# Patient Record
Sex: Female | Born: 1935 | Race: White | Hispanic: No | Smoking: Former smoker
Health system: Southern US, Community
[De-identification: ages and names within clinical notes are randomized; demographics above are authoritative.]

## PROBLEM LIST (undated history)

## (undated) DIAGNOSIS — I6529 Occlusion and stenosis of unspecified carotid artery: Secondary | ICD-10-CM

## (undated) DIAGNOSIS — G4700 Insomnia, unspecified: Secondary | ICD-10-CM

## (undated) DIAGNOSIS — M1990 Unspecified osteoarthritis, unspecified site: Secondary | ICD-10-CM

## (undated) DIAGNOSIS — E785 Hyperlipidemia, unspecified: Secondary | ICD-10-CM

## (undated) DIAGNOSIS — F32A Depression, unspecified: Secondary | ICD-10-CM

## (undated) DIAGNOSIS — F329 Major depressive disorder, single episode, unspecified: Secondary | ICD-10-CM

## (undated) DIAGNOSIS — E039 Hypothyroidism, unspecified: Secondary | ICD-10-CM

## (undated) DIAGNOSIS — I10 Essential (primary) hypertension: Secondary | ICD-10-CM

## (undated) DIAGNOSIS — G459 Transient cerebral ischemic attack, unspecified: Secondary | ICD-10-CM

## (undated) DIAGNOSIS — I2510 Atherosclerotic heart disease of native coronary artery without angina pectoris: Secondary | ICD-10-CM

## (undated) DIAGNOSIS — T4145XA Adverse effect of unspecified anesthetic, initial encounter: Secondary | ICD-10-CM

## (undated) DIAGNOSIS — H409 Unspecified glaucoma: Secondary | ICD-10-CM

## (undated) DIAGNOSIS — K279 Peptic ulcer, site unspecified, unspecified as acute or chronic, without hemorrhage or perforation: Secondary | ICD-10-CM

## (undated) DIAGNOSIS — T8859XA Other complications of anesthesia, initial encounter: Secondary | ICD-10-CM

## (undated) HISTORY — PX: CARDIAC CATHETERIZATION: SHX172

## (undated) HISTORY — PX: OTHER SURGICAL HISTORY: SHX169

## (undated) HISTORY — DX: Unspecified glaucoma: H40.9

## (undated) HISTORY — DX: Depression, unspecified: F32.A

## (undated) HISTORY — PX: KNEE ARTHROSCOPY: SHX127

## (undated) HISTORY — DX: Major depressive disorder, single episode, unspecified: F32.9

## (undated) HISTORY — DX: Transient cerebral ischemic attack, unspecified: G45.9

## (undated) HISTORY — PX: TONSILLECTOMY: SUR1361

---

## 2000-05-01 ENCOUNTER — Encounter: Payer: Self-pay | Admitting: Gastroenterology

## 2000-05-01 ENCOUNTER — Encounter: Admission: RE | Admit: 2000-05-01 | Discharge: 2000-05-01 | Payer: Self-pay | Admitting: Gastroenterology

## 2000-06-12 ENCOUNTER — Ambulatory Visit (HOSPITAL_COMMUNITY): Admission: RE | Admit: 2000-06-12 | Discharge: 2000-06-12 | Payer: Self-pay | Admitting: Gastroenterology

## 2004-06-07 ENCOUNTER — Inpatient Hospital Stay: Payer: Self-pay | Admitting: Internal Medicine

## 2004-06-07 ENCOUNTER — Other Ambulatory Visit: Payer: Self-pay

## 2004-08-24 ENCOUNTER — Emergency Department: Payer: Self-pay | Admitting: Emergency Medicine

## 2004-08-25 ENCOUNTER — Other Ambulatory Visit: Payer: Self-pay

## 2004-08-29 ENCOUNTER — Emergency Department: Payer: Self-pay | Admitting: Emergency Medicine

## 2004-09-27 ENCOUNTER — Ambulatory Visit: Payer: Self-pay | Admitting: Internal Medicine

## 2004-10-11 ENCOUNTER — Ambulatory Visit: Payer: Self-pay | Admitting: Ophthalmology

## 2004-10-16 ENCOUNTER — Ambulatory Visit: Payer: Self-pay | Admitting: Ophthalmology

## 2005-04-03 ENCOUNTER — Ambulatory Visit: Payer: Self-pay

## 2005-05-23 ENCOUNTER — Ambulatory Visit: Payer: Self-pay | Admitting: Rheumatology

## 2005-06-08 ENCOUNTER — Other Ambulatory Visit: Payer: Self-pay

## 2005-06-08 ENCOUNTER — Ambulatory Visit: Payer: Self-pay | Admitting: Orthopedic Surgery

## 2005-06-14 ENCOUNTER — Ambulatory Visit: Payer: Self-pay | Admitting: Orthopedic Surgery

## 2005-08-28 ENCOUNTER — Ambulatory Visit: Payer: Self-pay | Admitting: Gastroenterology

## 2005-12-18 ENCOUNTER — Ambulatory Visit: Payer: Self-pay | Admitting: Internal Medicine

## 2007-01-23 ENCOUNTER — Ambulatory Visit: Payer: Self-pay | Admitting: Internal Medicine

## 2008-01-29 ENCOUNTER — Ambulatory Visit: Payer: Self-pay | Admitting: Internal Medicine

## 2009-03-30 ENCOUNTER — Ambulatory Visit: Payer: Self-pay | Admitting: Internal Medicine

## 2009-04-05 ENCOUNTER — Ambulatory Visit: Payer: Self-pay | Admitting: Gastroenterology

## 2009-11-01 ENCOUNTER — Encounter: Payer: Self-pay | Admitting: Unknown Physician Specialty

## 2009-11-07 ENCOUNTER — Encounter: Payer: Self-pay | Admitting: Unknown Physician Specialty

## 2009-12-08 ENCOUNTER — Encounter: Payer: Self-pay | Admitting: Unknown Physician Specialty

## 2010-01-07 ENCOUNTER — Encounter: Payer: Self-pay | Admitting: Unknown Physician Specialty

## 2010-04-05 ENCOUNTER — Ambulatory Visit: Payer: Self-pay | Admitting: Internal Medicine

## 2010-10-27 ENCOUNTER — Emergency Department: Payer: Self-pay | Admitting: Emergency Medicine

## 2011-07-19 ENCOUNTER — Ambulatory Visit: Payer: Self-pay | Admitting: Internal Medicine

## 2011-08-28 ENCOUNTER — Ambulatory Visit: Payer: Self-pay | Admitting: Unknown Physician Specialty

## 2012-05-02 ENCOUNTER — Ambulatory Visit: Payer: Self-pay | Admitting: Orthopedic Surgery

## 2012-05-09 ENCOUNTER — Ambulatory Visit: Payer: Self-pay | Admitting: Physical Medicine and Rehabilitation

## 2012-09-11 ENCOUNTER — Ambulatory Visit: Payer: Self-pay | Admitting: Internal Medicine

## 2013-07-09 DIAGNOSIS — E782 Mixed hyperlipidemia: Secondary | ICD-10-CM | POA: Insufficient documentation

## 2013-07-18 ENCOUNTER — Emergency Department: Payer: Self-pay | Admitting: Emergency Medicine

## 2013-07-18 LAB — URINALYSIS, COMPLETE
Bilirubin,UR: NEGATIVE
Blood: NEGATIVE
Glucose,UR: NEGATIVE mg/dL (ref 0–75)
Hyaline Cast: 2
Leukocyte Esterase: NEGATIVE
Nitrite: NEGATIVE
Ph: 5 (ref 4.5–8.0)
Protein: 30
RBC,UR: 1 /HPF (ref 0–5)
Specific Gravity: 1.018 (ref 1.003–1.030)
Squamous Epithelial: <1
WBC UR: 2 /HPF (ref 0–5)

## 2013-07-18 LAB — CBC WITH DIFFERENTIAL/PLATELET
Basophil #: 0.1 10*3/uL (ref 0.0–0.1)
Basophil %: 1 %
Eosinophil #: 0.1 10*3/uL (ref 0.0–0.7)
Eosinophil %: 0.6 %
HCT: 42.1 % (ref 35.0–47.0)
HGB: 13.8 g/dL (ref 12.0–16.0)
Lymphocyte #: 1.7 10*3/uL (ref 1.0–3.6)
Lymphocyte %: 21 %
MCH: 29.9 pg (ref 26.0–34.0)
MCHC: 32.8 g/dL (ref 32.0–36.0)
MCV: 91 fL (ref 80–100)
Monocyte #: 0.6 x10 3/mm (ref 0.2–0.9)
Monocyte %: 8.1 %
Neutrophil #: 5.5 10*3/uL (ref 1.4–6.5)
Neutrophil %: 69.3 %
Platelet: 271 10*3/uL (ref 150–440)
RBC: 4.6 10*6/uL (ref 3.80–5.20)
RDW: 14.3 % (ref 11.5–14.5)
WBC: 8 10*3/uL (ref 3.6–11.0)

## 2013-07-18 LAB — D-DIMER(ARMC): D-Dimer: 449 ng/ml

## 2013-07-18 LAB — BASIC METABOLIC PANEL
Anion Gap: 8 (ref 7–16)
BUN: 20 mg/dL — ABNORMAL HIGH (ref 7–18)
Calcium, Total: 8.1 mg/dL — ABNORMAL LOW (ref 8.5–10.1)
Chloride: 104 mmol/L (ref 98–107)
Co2: 21 mmol/L (ref 21–32)
Creatinine: 0.72 mg/dL (ref 0.60–1.30)
EGFR (African American): >60
EGFR (Non-African Amer.): >60
Glucose: 109 mg/dL — ABNORMAL HIGH (ref 65–99)
Osmolality: 270 (ref 275–301)
Potassium: 4.7 mmol/L (ref 3.5–5.1)
Sodium: 133 mmol/L — ABNORMAL LOW (ref 136–145)

## 2013-07-18 LAB — TROPONIN I
Troponin-I: <0.02 ng/mL
Troponin-I: <0.02 ng/mL

## 2013-08-19 ENCOUNTER — Inpatient Hospital Stay: Payer: Self-pay | Admitting: Internal Medicine

## 2013-08-19 LAB — URINALYSIS, COMPLETE
BLOOD: NEGATIVE
Bacteria: NONE SEEN
Bilirubin,UR: NEGATIVE
Glucose,UR: NEGATIVE mg/dL (ref 0–75)
Ketone: NEGATIVE
LEUKOCYTE ESTERASE: NEGATIVE
Nitrite: NEGATIVE
PH: 6 (ref 4.5–8.0)
PROTEIN: NEGATIVE
RBC, UR: NONE SEEN /HPF (ref 0–5)
SPECIFIC GRAVITY: 1.017 (ref 1.003–1.030)

## 2013-08-19 LAB — CBC
HCT: 37.8 % (ref 35.0–47.0)
HGB: 12.5 g/dL (ref 12.0–16.0)
MCH: 30.3 pg (ref 26.0–34.0)
MCHC: 32.9 g/dL (ref 32.0–36.0)
MCV: 92 fL (ref 80–100)
Platelet: 181 10*3/uL (ref 150–440)
RBC: 4.1 10*6/uL (ref 3.80–5.20)
RDW: 13.5 % (ref 11.5–14.5)
WBC: 9.1 10*3/uL (ref 3.6–11.0)

## 2013-08-19 LAB — COMPREHENSIVE METABOLIC PANEL
Albumin: 2.8 g/dL — ABNORMAL LOW (ref 3.4–5.0)
Alkaline Phosphatase: 68 U/L
Anion Gap: 9 (ref 7–16)
BUN: 22 mg/dL — ABNORMAL HIGH (ref 7–18)
Bilirubin,Total: 0.6 mg/dL (ref 0.2–1.0)
Calcium, Total: 8.2 mg/dL — ABNORMAL LOW (ref 8.5–10.1)
Chloride: 106 mmol/L (ref 98–107)
Co2: 22 mmol/L (ref 21–32)
Creatinine: 1.01 mg/dL (ref 0.60–1.30)
EGFR (African American): >60
EGFR (Non-African Amer.): 52 — ABNORMAL LOW
Glucose: 117 mg/dL — ABNORMAL HIGH (ref 65–99)
Osmolality: 278 (ref 275–301)
Potassium: 4 mmol/L (ref 3.5–5.1)
SGOT(AST): 21 U/L (ref 15–37)
SGPT (ALT): 16 U/L (ref 12–78)
Sodium: 137 mmol/L (ref 136–145)
Total Protein: 6.5 g/dL (ref 6.4–8.2)

## 2013-08-19 LAB — CK TOTAL AND CKMB (NOT AT ARMC)
CK, Total: 90 U/L
CK-MB: 1 ng/mL (ref 0.5–3.6)

## 2013-08-19 LAB — TROPONIN I: Troponin-I: <0.02 ng/mL

## 2013-08-20 LAB — BASIC METABOLIC PANEL
ANION GAP: 4 — AB (ref 7–16)
BUN: 20 mg/dL — ABNORMAL HIGH (ref 7–18)
CALCIUM: 8.3 mg/dL — AB (ref 8.5–10.1)
CO2: 28 mmol/L (ref 21–32)
CREATININE: 0.74 mg/dL (ref 0.60–1.30)
Chloride: 105 mmol/L (ref 98–107)
EGFR (African American): >60
GLUCOSE: 100 mg/dL — AB (ref 65–99)
OSMOLALITY: 277 (ref 275–301)
Potassium: 3.9 mmol/L (ref 3.5–5.1)
Sodium: 137 mmol/L (ref 136–145)

## 2013-08-20 LAB — PROTIME-INR
INR: 1.1
Prothrombin Time: 13.6 secs (ref 11.5–14.7)

## 2013-08-20 LAB — CBC WITH DIFFERENTIAL/PLATELET
BASOS ABS: 0.1 10*3/uL (ref 0.0–0.1)
Basophil %: 1.2 %
EOS PCT: 3.3 %
Eosinophil #: 0.2 10*3/uL (ref 0.0–0.7)
HCT: 35.2 % (ref 35.0–47.0)
HGB: 11.6 g/dL — AB (ref 12.0–16.0)
Lymphocyte #: 1.1 10*3/uL (ref 1.0–3.6)
Lymphocyte %: 21.8 %
MCH: 30.4 pg (ref 26.0–34.0)
MCHC: 33.1 g/dL (ref 32.0–36.0)
MCV: 92 fL (ref 80–100)
MONO ABS: 0.5 x10 3/mm (ref 0.2–0.9)
MONOS PCT: 10.6 %
NEUTROS ABS: 3.2 10*3/uL (ref 1.4–6.5)
NEUTROS PCT: 63.1 %
PLATELETS: 162 10*3/uL (ref 150–440)
RBC: 3.83 10*6/uL (ref 3.80–5.20)
RDW: 13.5 % (ref 11.5–14.5)
WBC: 5 10*3/uL (ref 3.6–11.0)

## 2013-08-20 LAB — LIPID PANEL
Cholesterol: 141 mg/dL (ref 0–200)
HDL: 42 mg/dL (ref 40–60)
Ldl Cholesterol, Calc: 73 mg/dL (ref 0–100)
TRIGLYCERIDES: 129 mg/dL (ref 0–200)
VLDL CHOLESTEROL, CALC: 26 mg/dL (ref 5–40)

## 2013-08-20 LAB — TSH: Thyroid Stimulating Horm: 2.64 u[IU]/mL

## 2013-08-21 LAB — BASIC METABOLIC PANEL
ANION GAP: 6 — AB (ref 7–16)
BUN: 13 mg/dL (ref 7–18)
CALCIUM: 8.9 mg/dL (ref 8.5–10.1)
CO2: 29 mmol/L (ref 21–32)
CREATININE: 0.7 mg/dL (ref 0.60–1.30)
Chloride: 102 mmol/L (ref 98–107)
EGFR (Non-African Amer.): >60
Glucose: 109 mg/dL — ABNORMAL HIGH (ref 65–99)
Osmolality: 275 (ref 275–301)
Potassium: 4 mmol/L (ref 3.5–5.1)
Sodium: 137 mmol/L (ref 136–145)

## 2013-08-21 LAB — CBC WITH DIFFERENTIAL/PLATELET
Basophil #: 0 10*3/uL (ref 0.0–0.1)
Basophil %: 0.7 %
EOS ABS: 0 10*3/uL (ref 0.0–0.7)
EOS PCT: 0.7 %
HCT: 34.3 % — ABNORMAL LOW (ref 35.0–47.0)
HGB: 11.2 g/dL — ABNORMAL LOW (ref 12.0–16.0)
LYMPHS ABS: 0.9 10*3/uL — AB (ref 1.0–3.6)
LYMPHS PCT: 15 %
MCH: 29.7 pg (ref 26.0–34.0)
MCHC: 32.7 g/dL (ref 32.0–36.0)
MCV: 91 fL (ref 80–100)
MONOS PCT: 7.2 %
Monocyte #: 0.4 x10 3/mm (ref 0.2–0.9)
NEUTROS ABS: 4.5 10*3/uL (ref 1.4–6.5)
NEUTROS PCT: 76.4 %
Platelet: 186 10*3/uL (ref 150–440)
RBC: 3.77 10*6/uL — AB (ref 3.80–5.20)
RDW: 13.2 % (ref 11.5–14.5)
WBC: 5.8 10*3/uL (ref 3.6–11.0)

## 2013-08-22 LAB — CBC WITH DIFFERENTIAL/PLATELET
BASOS ABS: 0.1 10*3/uL (ref 0.0–0.1)
Basophil %: 0.9 %
EOS PCT: 2.2 %
Eosinophil #: 0.1 10*3/uL (ref 0.0–0.7)
HCT: 31.6 % — ABNORMAL LOW (ref 35.0–47.0)
LYMPHS ABS: 0.9 10*3/uL — AB (ref 1.0–3.6)
LYMPHS PCT: 16.4 %
MCH: 29.3 pg (ref 26.0–34.0)
MCHC: 32.9 g/dL (ref 32.0–36.0)
MCV: 89 fL (ref 80–100)
MONO ABS: 0.5 x10 3/mm (ref 0.2–0.9)
Monocyte %: 9.3 %
NEUTROS ABS: 4 10*3/uL (ref 1.4–6.5)
Neutrophil %: 71.2 %
PLATELETS: 196 10*3/uL (ref 150–440)
RBC: 3.55 10*6/uL — AB (ref 3.80–5.20)
RDW: 13.2 % (ref 11.5–14.5)
WBC: 5.6 10*3/uL (ref 3.6–11.0)

## 2013-08-22 LAB — HEMOGLOBIN: HGB: 10.4 g/dL — ABNORMAL LOW (ref 12.0–16.0)

## 2013-08-23 LAB — CBC WITH DIFFERENTIAL/PLATELET
Basophil #: 0.1 10*3/uL (ref 0.0–0.1)
Basophil %: 1.1 %
EOS ABS: 0.1 10*3/uL (ref 0.0–0.7)
Eosinophil %: 2.3 %
HCT: 33.4 % — AB (ref 35.0–47.0)
HGB: 10.9 g/dL — ABNORMAL LOW (ref 12.0–16.0)
LYMPHS ABS: 1.3 10*3/uL (ref 1.0–3.6)
Lymphocyte %: 24.2 %
MCH: 29.5 pg (ref 26.0–34.0)
MCHC: 32.7 g/dL (ref 32.0–36.0)
MCV: 90 fL (ref 80–100)
MONO ABS: 0.5 x10 3/mm (ref 0.2–0.9)
Monocyte %: 9.3 %
NEUTROS ABS: 3.4 10*3/uL (ref 1.4–6.5)
Neutrophil %: 63.1 %
Platelet: 243 10*3/uL (ref 150–440)
RBC: 3.71 10*6/uL — AB (ref 3.80–5.20)
RDW: 13.2 % (ref 11.5–14.5)
WBC: 5.4 10*3/uL (ref 3.6–11.0)

## 2013-08-24 ENCOUNTER — Encounter: Payer: Self-pay | Admitting: Internal Medicine

## 2013-08-27 LAB — URINALYSIS, COMPLETE
Bacteria: NONE SEEN
Bilirubin,UR: NEGATIVE
Blood: NEGATIVE
Glucose,UR: NEGATIVE mg/dL
Ketone: NEGATIVE
Leukocyte Esterase: NEGATIVE
Nitrite: NEGATIVE
Ph: 8
Protein: NEGATIVE
RBC,UR: NONE SEEN /HPF
Specific Gravity: 1.008
Squamous Epithelial: 1
WBC UR: 1 /HPF

## 2013-08-29 LAB — URINE CULTURE

## 2013-09-07 ENCOUNTER — Encounter: Payer: Self-pay | Admitting: Internal Medicine

## 2013-10-07 ENCOUNTER — Encounter: Payer: Self-pay | Admitting: Internal Medicine

## 2014-01-07 ENCOUNTER — Ambulatory Visit: Payer: Self-pay | Admitting: Specialist

## 2014-01-24 ENCOUNTER — Emergency Department: Payer: Self-pay | Admitting: Emergency Medicine

## 2014-01-26 ENCOUNTER — Emergency Department: Payer: Self-pay | Admitting: Emergency Medicine

## 2014-02-05 ENCOUNTER — Encounter: Payer: Self-pay | Admitting: Internal Medicine

## 2014-02-06 HISTORY — PX: TOTAL HIP ARTHROPLASTY: SHX124

## 2014-02-07 ENCOUNTER — Encounter: Payer: Self-pay | Admitting: Internal Medicine

## 2014-03-03 ENCOUNTER — Encounter: Payer: Self-pay | Admitting: Orthopedic Surgery

## 2014-03-09 ENCOUNTER — Encounter: Payer: Self-pay | Admitting: Orthopedic Surgery

## 2014-03-09 ENCOUNTER — Encounter: Payer: Self-pay | Admitting: Internal Medicine

## 2014-04-09 ENCOUNTER — Encounter: Payer: Self-pay | Admitting: Orthopedic Surgery

## 2014-04-09 ENCOUNTER — Encounter: Payer: Self-pay | Admitting: Internal Medicine

## 2014-05-10 ENCOUNTER — Encounter: Payer: Self-pay | Admitting: Orthopedic Surgery

## 2014-05-10 ENCOUNTER — Encounter: Payer: Self-pay | Admitting: Internal Medicine

## 2014-06-08 ENCOUNTER — Encounter
Admit: 2014-06-08 | Disposition: A | Discharge status: Home / Self Care | Payer: Self-pay | Attending: Internal Medicine | Admitting: Internal Medicine

## 2014-07-09 ENCOUNTER — Encounter
Admit: 2014-07-09 | Disposition: A | Discharge status: Home / Self Care | Payer: Self-pay | Attending: Internal Medicine | Admitting: Internal Medicine

## 2014-07-15 DIAGNOSIS — I493 Ventricular premature depolarization: Secondary | ICD-10-CM | POA: Insufficient documentation

## 2014-07-31 NOTE — Discharge Summary (Signed)
PATIENT NAME:  Kerry Cabrera, Kerry Cabrera MR#:  [id number] DATE OF BIRTH:  03/31/1929  DATE OF ADMISSION:  08/19/2013 DATE OF DISCHARGE:  08/24/2013  DISCHARGE DIAGNOSES: 1.  Right hip fracture, postoperative. 2.  Dizziness, likely vestibular.  3.  Hypertension.  4.  Postoperative delirium, resolved.  5.  Hypothyroid.  6.  Right facial fracture.   DISCHARGE MEDICATIONS: 1.  Synthroid 112 mcg daily. 2.  Paxil 20 mg daily. 3.  Norco 5/325 q. 8 p.r.n.  4.  Metoprolol succinate 12.5 mg b.i.d.  5.  Amlodipine 5 mg b.i.d. 6.  Flurazepam 15 mg at bedtime as needed for sleep. 7.  Tylenol 650 mg b.i.d.  8.  Fosamax 70 mg weekly. 9.  Ferrous sulfate 325 mg b.i.d.  10.  Dulcolax suppositories p.r.n.  11.  Calcium vitamin D b.i.d.  12.  Pantoprazole 40 mg daily. 13.  Lovenox 40 mg daily x10 days. 14.  Xanax 0.25 mg daily p.r.n. anxiety.   REASON FOR ADMISSION: This is an 81 year old female who presents with right hip fracture and right facial fracture.   HOSPITAL COURSE: The patient was admitted, went to the OR for right hip pinning. Overall did well. Had postop delirium that was minimized by minimizing her narcotic meds and she cannot take the 30 mg of Dalmane, full dose, but seems to tolerate 15 mg p.r.n. Her hemoglobin was stable at 10.9. The ENT physician saw her and said her facial fracture was nondisplaced, really no further treatment indicated. May need vestibular testing as an outpatient with her dizziness, which did lead to her fall.  OVERALL PROGNOSIS: Guarded.  ____________________________ Danella PentonMark F. Daijha Leggio, MD mfm:sb D: 08/24/2013 07:54:22 ET T: 08/24/2013 08:19:55 ET JOB#: [id number]  cc: Danella PentonMark F. Yarden Hillis, MD, <Dictator> Danella PentonMARK F Aman Bonet MD ELECTRONICALLY SIGNED 08/25/2013 8:05

## 2014-07-31 NOTE — Consult Note (Signed)
PATIENT NAME:  Kerry Cabrera, LABINE MR#:  [id number] DATE OF BIRTH:  [birth year]  DATE OF CONSULTATION:  08/20/2013  REFERRING PHYSICIAN: Dr. Hyacinth Meeker  CONSULTING PHYSICIAN:  Ollen Gross. Willeen Cass, MD  REASON FOR CONSULTATION: Facial fracture.   HISTORY OF PRESENT ILLNESS: This 81 year old female is admitted to the hospital with a hip fracture after falling Monday. She did not come to the Emergency Room immediately, but was seen yesterday and evaluated and found to have a right hip fracture as well as a fracture involving the right maxillary sinus and floor of the orbit. The fracture had minimal displacement and no evidence of any ocular entrapment. Apparently the fall occurred after she had a brief dizzy spell without passing out.   PAST MEDICAL HISTORY: Hypertension, hypothyroidism, history of  TIA and arthritis.   PAST SURGICAL HISTORY: None.   SOCIAL HISTORY: She is a retired Airline pilot and a prior history of smoking, having quit when she was 52. She drinks alcohol socially.   FAMILY HISTORY: Positive for coronary artery disease. Her sister died of lung cancer and was a smoker.   HOME MEDICATIONS: Synthroid 112 mcg daily, paroxetine 20 mg p.o. daily, metoprolol 25 mg p.o. daily, lorazepam 30 mg p.o. daily, amlodipine 2.5 mg 2 p.o. daily and Vicodin 325 mg/5 every 6 hours as needed.   REVIEW OF SYSTEMS: Currently unobtainable. She has just come out of surgery and is quite groggy, but on admission she had not had any fever, fatigue, weakness, weight loss, weight gain, blurring or double vision, tinnitus, ear pain, hearing loss, chest pain, orthopnea, palpitations, nausea, vomiting or diarrhea.   PHYSICAL EXAMINATION:  VITAL SIGNS: Temperature is 99, pulse 66, blood pressure 146/69.  GENERAL: Well-developed, well-nourished female in no acute distress. She is still quite groggy from having just come out of anesthesia for her hip surgery.  HEAD AND FACE: Head is normocephalic. There is ecchymosis around  the right eye. There is no palpable step-off deformity. Ears: External ears, ear canals and tympanic membranes are clear bilaterally. There is no middle ear effusion or infection. Nose: External nose unremarkable. Nasal cavities clear. No purulence or polyps seen. Oral cavity and oropharynx: Teeth, lips, and gums unremarkable. Tongue and floor of mouth without lesions. Posterior pharynx is clear. The teeth fit together without any evidence of malocclusion.  NECK: Supple without adenopathy or mass. There is no thyromegaly.  NEUROLOGIC: Cranial nerves II through XII are grossly intact. She does not appear to have any numbness of the right mid face.   DATA REVIEW: I reviewed her CT scan. There is a minimally displaced fracture of the anterior wall of the left maxillary sinus with blood in the sinus. The zygomatic arch appears to be intact. There may be a small nondisplaced fracture of the orbital floor. There is no ocular entrapment.   ASSESSMENT: The patient has a minimally displaced fracture of the right maxillary sinus and orbital floor. The most displacement is on the anterior wall of the sinus and would not cause any cosmetic disfigurement. She does not have any ocular entrapment. There is no need for surgical intervention. The fracture may be left to heal on its own. She has been given antibiotics perioperatively for her hip surgery. No further antibiotic coverage is indicated in the absence of any obvious infection. No specific follow up with me as necessary. The case of dizziness was isolated, but if she has further issues with that, I would be happy to see her in the office for consideration of  vestibular testing.  ____________________________ Ollen GrossPaul S. Willeen CassBennett, MD psb:aw D: 08/20/2013 13:26:48 ET T: 08/20/2013 14:13:45 ET JOB#: [id number]  cc: Ollen GrossPaul S. Willeen CassBennett, MD, <Dictator> Sandi MealyPAUL S Monesha Monreal MD ELECTRONICALLY SIGNED 08/27/2013 19:12

## 2014-07-31 NOTE — Consult Note (Signed)
Brief Consult Note: Diagnosis: Impacted right femoral neck fracture.   Patient was seen by consultant.   Recommend to proceed with surgery or procedure.   Recommend further assessment or treatment.   Orders entered.   Discussed with Attending MD.   Comments: 81 year old female fell 2 days ago injuring the right hip and face.  Used a walker until her daughter insisted she come to Emergency Room today.  Exam and X-rays show an impacted right subcapital hip fracture. She is to be admitted for IV hydration and medcial evaluation.  Discussed treatment with patient and daughter. Recommend percutaneous pinning of fracture tomorrow AM. Risks and benefits of surgery were discussed at length including but not limited to infection, non union, nerve or blood vessed damage, non union, need for repeat surgery, blood clots and lung emboli, and death.   Exam: alert and oriented. circulation/sensation/motor function good distally. No shortening.  Moves well.  skin intact.  Bruising around right maxilla and eye.    X-rays: as above  Imp: impacted right subcapital fracture   Rx:  Percutaneous pinning in AM.  Electronic Signatures: Valinda HoarMiller, Elliet Goodnow E (MD)  (Signed 13-May-15 17:33)  Authored: Brief Consult Note   Last Updated: 13-May-15 17:33 by Valinda HoarMiller, Shahana Capes E (MD)

## 2014-07-31 NOTE — H&P (Signed)
PATIENT NAME:  Genella RifeLASSITER, Ramani S MR#:  [id number] DATE OF BIRTH:  1928-09-11  DATE OF ADMISSION:  08/19/2013  PRIMARY CARE PHYSICIAN: Danella PentonMark F. Miller, [facility], [facility].   REFERRING ER PHYSICIAN:  Sheryl L. Mindi JunkerGottlieb, MD  CHIEF COMPLAINT: Fall and hip fracture.   HISTORY OF PRESENT ILLNESS: An 81 year old female who has past medical history of hypertension, hypothyroidism, transient ischemic attack and left knee arthritis, who lives alone with a walker and very active life according to her age, working in her back yard on flowers and also member of many clubs and drives herself up to a those places. On Monday, early morning, she got up to go to the bathroom and while standing up from commode she decided not to use her walker or cane and just tried to stand and felt some vertigo and just fell down on the floor, hit her face and her legs split up so she had severe pain and could not get up after that. Denies any episode of passing out or chest pain or shortness of breath or palpitations with that, but because of pain could not get up and so she crawled herself to do bed, came to the bed and got up on that, called her daughter who lives nearby and she insisted to come to the Emergency Room but the patient states she feels fine, not much problem and this was only 1 episode of vertigo so she resisted to come to hospital. But then next day continued having the pain. She also walked a little with her walker but did not do much and so finally the daughter called 911 and brought her to the hospital. On workup in the ER, she was found having a hip fracture and maxillary bone fracture on the right side of the face. ER physician spoke to orthopedic doctor and he is planning to do surgery tomorrow morning but advised to get medical team for admission and clearance for surgery.   REVIEW OF SYSTEMS:    CONSTITUTIONAL: Negative for fever, fatigue, weakness. Has some pain in the rib. No weight loss or weight gain.   EYES: No blurring, double vision, discharge or redness.  EARS, NOSE, THROAT: No tinnitus, ear pain or hearing loss.  CARDIOVASCULAR: No chest pain, orthopnea, edema, arrhythmia or palpitations  RESPIRATORY: No cough, wheezing, hemoptysis, shortness of breath or painful respiration.  GASTROINTESTINAL: No nausea, vomiting, diarrhea, abdominal pain.  GENITOURINARY: No dysuria, hematuria or increased frequency.  ENDOCRINE: No heat or cold intolerance, no excessive sweating.  MUSCULOSKELETAL: Pain in right hip but not in any other joints.   NEUROLOGICAL: No numbness, weakness, tremor or vertigo.  PSYCHIATRIC: No anxiety, insomnia or bipolar disorder.    PAST MEDICAL HISTORY:  1.  Hypertension.  2.  Hypothyroidism.  3.  Transient ischemic attack.  4.  Left knee arthritis.   PAST SURGICAL HISTORY: None.   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 Walks with a walker or sometimes a cane. Was a smoker for almost 15 years from her college life till she got pregnant and then she quit, that is 81 years old. She is drinks alcohol occasionally but does not do any drugs.   FAMILY HISTORY: Most of the females in her family and as her mother and mother's sister had coronary artery disease. The patient's sister was a smoker and she died of lung cancer.   HOME MEDICATIONS: 1.  Synthroid 112 mcg once a day.  2.  Paroxetine 20 mg  once a day.  3.  Metoprolol 25 mg oral tablet once a day.  4.  Flurazepam 30 mg oral once a day.  5.  Amlodipine 2.5 mg oral 2 tablets once a day.  6.  Acetaminophen and hydrocodone 325 plus 5 mg every 6 hours as needed.   PHYSICAL EXAMINATION: VITAL SIGNS:  In the ER, pulse 69, respirations 18, blood pressure 116/53 and pulse oximetry is 90% on room air.  GENERAL: The patient is fully alert and oriented to time, place and person. Does not appear in any acute distress.  HEENT: Head and neck there is a laceration and dark coloring underneath the right eye  present and tender to palpation. Conjunctiva pink. Oral mucosa moist. Pupils bilaterally equally reactive. NECK: Supple. No JVD.  RESPIRATORY: Bilateral equal and clear air entry.  CARDIOVASCULAR: S1, S2 present, regular. No murmur.  ABDOMEN: Soft, nontender. Bowel sounds present. No organomegaly.  SKIN: No acne, rashes or lesions.  MUSCULOSKELETAL: There is pain in the right hip and limited movement because of the pain.  NEUROLOGICAL: No numbness, weakness, tremor. Moves all 4 limbs but movement on the right lower limb is limited because of pain. Power 4 out of 5 in all limbs. No tremor or rigidity.  PSYCHIATRIC: No anxiety, insomnia, does not appear in any acute psychiatric illness at this time.   LABORATORY, DIAGNOSTIC AND RADIOLOGICAL DATA:   1.  Glucose 117, BUN 22, creatinine 1.01, sodium 137, potassium is 4, chloride is 106, CO2 is 22, calcium is 8.2, total protein is 6.5, bilirubin 0.6, alkaline phosphatase 68, SGOT 21 and SGPT 16.  2.  Troponin is less than 0.02.  3.  WBC 9.1, hemoglobin 12.5 and platelet count 181.  4.  CT scan of the head without contrast, no evidence of acute intracranial hemorrhage, mild age-appropriate changes, fracture to orbital floor on the right and soft tissue density on the maxillary sinus.  5.  CT maxillofacial showed acute fracture involving anteromedial and posterolateral wall of the right maxillary sinus, right floor of the orbit fracture, hemorrhage into right maxillary sinus noted.  6.  On the right femur fracture, mild displaced proximal right femoral neck fracture, severe  degenerative joint disease of right knee.   ASSESSMENT AND PLAN: An 81 year old female with history of hypertension and hypothyroidism who came after a fall which is mostly mechanical with some vertigo, has fractured right maxillary bone and right neck of femur.  1.  Right hip fracture. Orthopedic consult is called in by ER physician and planning for surgery tomorrow morning. Because  of the patient's remote history of smoking 50 years ago and hypertension and hypothyroid but having very active life and not having any chest pain or restrictions other than her arthritis pain, she would be low to moderate risk and she is optimized for the surgery. We agree with proceeding for surgery tomorrow morning and along with orthopedic surgeons will continue following for all the medical management during her pre- and postoperative period. Pain management and anticoagulation as per orthopedic team.  2.  Vertigo and fall. This was just 1-time accidental episode as per the patient. She never had any vertigo or dizziness. Did not have any chest pain or palpitation with this and no loss of consciousness so I would not do any further workup for this but just let her follow with her primary care physician after her orthopedic surgery for this issue.  3.  Hypertension. Blood pressure is very well controlled with metoprolol and amlodipine as  she is taking. Will continue the same.  4.  Hypothyroidism. She is on levothyroxine. Will check TSH level and continue the same dose as at home.   5.  Right maxillary bone fracture. I will call ENT consult for further management of this issue and meanwhile continue pain medication.  6.  CODE STATUS: Full code.   TOTAL TIME SPENT ON THIS ADMISSION: 50 minutes.   Plan explained to patient's daughter, who is present in the room.    ____________________________ Hope Pigeon Elisabeth Pigeon, MD vgv:cs D: 08/19/2013 17:12:31 ET T: 08/19/2013 18:13:04 ET JOB#: [id number]  cc: Hope Pigeon. Elisabeth Pigeon, MD, <Dictator> Danella Penton, MD Altamese Dilling MD ELECTRONICALLY SIGNED 09/01/2013 16:32

## 2014-07-31 NOTE — Op Note (Signed)
PATIENT NAME:  Kerry Cabrera, Kerry Cabrera MR#:  [id number] DATE OF BIRTH:  1928/09/28  DATE OF PROCEDURE:  08/20/2013  PREOPERATIVE DIAGNOSIS:  Impacted subcapital fracture, right hip.   POSTOPERATIVE DIAGNOSIS:  Impacted subcapital fracture, right hip.    PROCEDURE PERFORMED:  Percutaneous pinning, right subcapital hip fracture.   SURGEON:  Deeann SaintHoward Preslie Depasquale, M.D.   ANESTHESIA:  Spinal.   COMPLICATIONS:  None.   DRAINS:  None.   ESTIMATED BLOOD LOSS: Minimal. Replaced: None.   PROCEDURE IN DETAIL:  The patient was brought to the operating room where she underwent satisfactory spinal anesthesia and was placed on the fracture table. She was padded and positioned appropriately. The right leg was placed in mild traction with internal rotation. The hip fluoroscopy showed the fracture remained in good position. The hip was prepped and draped in sterile fashion and 4 stab wounds were made. Guide pins were inserted under fluoroscopic control into the head and neck of the femur and they were then drilled and filled was 4 long thread 7.3 mm cannulated cancellus screws. Fluoroscopy showed all hardware to be in good position after traction had been released. The pins were removed.  The stab wounds were closed with 3-0 nylon. Dry sterile dressing was applied. The patient was transferred to her hospital bed and taken to recovery in good condition. She had good motion of the hip without crepitus.      ____________________________ Valinda HoarHoward E. Ayani Ospina, MD hem:dmm D: 08/20/2013 11:40:19 ET T: 08/20/2013 11:59:51 ET JOB#: [id number]  cc: Valinda HoarHoward E. Sha Amer, MD, <Dictator> Valinda HoarHOWARD E Bexley Mclester MD ELECTRONICALLY SIGNED 08/21/2013 13:29

## 2014-08-13 ENCOUNTER — Emergency Department
Admission: EM | Admit: 2014-08-13 | Discharge: 2014-08-13 | Discharge status: Against Medical Advice | Payer: Medicare Other | Attending: Student | Admitting: Student

## 2014-08-13 ENCOUNTER — Other Ambulatory Visit: Payer: Self-pay

## 2014-08-13 DIAGNOSIS — R531 Weakness: Secondary | ICD-10-CM | POA: Insufficient documentation

## 2014-08-13 DIAGNOSIS — R42 Dizziness and giddiness: Secondary | ICD-10-CM | POA: Diagnosis not present

## 2014-08-13 LAB — BASIC METABOLIC PANEL
Anion gap: 7 (ref 5–15)
BUN: 26 mg/dL — ABNORMAL HIGH (ref 6–20)
CHLORIDE: 105 mmol/L (ref 101–111)
CO2: 26 mmol/L (ref 22–32)
CREATININE: 1.03 mg/dL — AB (ref 0.44–1.00)
Calcium: 9 mg/dL (ref 8.9–10.3)
GFR calc non Af Amer: 49 mL/min — ABNORMAL LOW (ref >60)
GFR, EST AFRICAN AMERICAN: 57 mL/min — AB (ref >60)
Glucose, Bld: 96 mg/dL (ref 65–99)
Potassium: 4.3 mmol/L (ref 3.5–5.1)
SODIUM: 138 mmol/L (ref 135–145)

## 2014-08-13 LAB — CBC
HCT: 42.2 % (ref 35.0–47.0)
Hemoglobin: 13.9 g/dL (ref 12.0–16.0)
MCH: 29.7 pg (ref 26.0–34.0)
MCHC: 33 g/dL (ref 32.0–36.0)
MCV: 90 fL (ref 80.0–100.0)
Platelets: 259 10*3/uL (ref 150–440)
RBC: 4.69 MIL/uL (ref 3.80–5.20)
RDW: 15.4 % — AB (ref 11.5–14.5)
WBC: 6.6 10*3/uL (ref 3.6–11.0)

## 2014-08-13 LAB — TROPONIN I

## 2014-08-13 NOTE — ED Notes (Signed)
Pt c/o increased weakness over the past month with intermittent dizziness..states she has been taking welbutrin over the past month, was seen by cardiologist in the past 2 weeks and was checked out ok..denies pain or any recent illness..Marland Kitchen

## 2014-08-14 LAB — URINALYSIS COMPLETE WITH MICROSCOPIC (ARMC ONLY)
BILIRUBIN URINE: NEGATIVE
Bacteria, UA: NONE SEEN
GLUCOSE, UA: NEGATIVE mg/dL
HGB URINE DIPSTICK: NEGATIVE
Ketones, ur: NEGATIVE mg/dL
Leukocytes, UA: NEGATIVE
NITRITE: NEGATIVE
Protein, ur: NEGATIVE mg/dL
Specific Gravity, Urine: 1.013 (ref 1.005–1.030)
pH: 5 (ref 5.0–8.0)

## 2014-09-24 ENCOUNTER — Other Ambulatory Visit: Payer: Self-pay | Admitting: Physical Medicine and Rehabilitation

## 2014-09-24 DIAGNOSIS — M5416 Radiculopathy, lumbar region: Secondary | ICD-10-CM

## 2014-10-01 ENCOUNTER — Ambulatory Visit: Payer: Medicare Other

## 2014-10-04 ENCOUNTER — Ambulatory Visit
Admission: RE | Admit: 2014-10-04 | Discharge: 2014-10-04 | Disposition: A | Discharge status: Home / Self Care | Payer: Medicare Other | Source: Ambulatory Visit | Attending: Physical Medicine and Rehabilitation | Admitting: Physical Medicine and Rehabilitation

## 2014-10-04 DIAGNOSIS — K8020 Calculus of gallbladder without cholecystitis without obstruction: Secondary | ICD-10-CM | POA: Diagnosis not present

## 2014-10-04 DIAGNOSIS — M4806 Spinal stenosis, lumbar region: Secondary | ICD-10-CM | POA: Diagnosis not present

## 2014-10-04 DIAGNOSIS — G544 Lumbosacral root disorders, not elsewhere classified: Secondary | ICD-10-CM | POA: Insufficient documentation

## 2014-10-04 DIAGNOSIS — M5126 Other intervertebral disc displacement, lumbar region: Secondary | ICD-10-CM | POA: Insufficient documentation

## 2014-10-04 DIAGNOSIS — M5416 Radiculopathy, lumbar region: Secondary | ICD-10-CM

## 2014-12-14 ENCOUNTER — Ambulatory Visit: Payer: Medicare Other | Attending: Internal Medicine

## 2014-12-14 DIAGNOSIS — M25562 Pain in left knee: Secondary | ICD-10-CM | POA: Diagnosis present

## 2014-12-14 DIAGNOSIS — M25551 Pain in right hip: Secondary | ICD-10-CM | POA: Diagnosis present

## 2014-12-14 DIAGNOSIS — M25561 Pain in right knee: Secondary | ICD-10-CM | POA: Insufficient documentation

## 2014-12-14 DIAGNOSIS — M545 Low back pain, unspecified: Secondary | ICD-10-CM

## 2014-12-14 DIAGNOSIS — R262 Difficulty in walking, not elsewhere classified: Secondary | ICD-10-CM | POA: Insufficient documentation

## 2014-12-14 DIAGNOSIS — R531 Weakness: Secondary | ICD-10-CM | POA: Insufficient documentation
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
## 2014-12-16 ENCOUNTER — Ambulatory Visit: Payer: Medicare Other

## 2014-12-16 DIAGNOSIS — M25551 Pain in right hip: Secondary | ICD-10-CM | POA: Diagnosis not present

## 2014-12-16 DIAGNOSIS — R262 Difficulty in walking, not elsewhere classified: Secondary | ICD-10-CM

## 2014-12-16 DIAGNOSIS — M25562 Pain in left knee: Secondary | ICD-10-CM

## 2014-12-16 DIAGNOSIS — R531 Weakness: Secondary | ICD-10-CM

## 2014-12-16 DIAGNOSIS — M545 Low back pain, unspecified: Secondary | ICD-10-CM

## 2014-12-16 DIAGNOSIS — M25561 Pain in right knee: Secondary | ICD-10-CM

## 2014-12-16 NOTE — Patient Instructions (Addendum)
Knee Roll   Lying on back, with knees bent and feet flat on BED, arms to sides, slowly roll both knees to side, hold 5 seconds. Back to starting position, hold 5 seconds. Then to opposite side, hold 5 seconds. Return to starting position. Keep shoulders and arms in contact with BED.   Copyright  VHI. All rights reserved.  Pelvic Tilt: Posterior - Legs Bent (Supine)   Tighten stomach and flatten back by rolling pelvis down. Relax. Repeat _10___ times per set. Do ___3_ sets per session. Do _1___ sessions per day.  http://orth.exer.us/203   Copyright  VHI. All rights reserved.    Improved exercise technique, movement at target joints, use of target muscles after mod verbal, visual, tactile cues.

## 2014-12-17 NOTE — Therapy (Signed)
[place] [facility] [address], [place], [address] Phone: [phone]   Fax:  [phone]  Physical Therapy Treatment  Patient Details  Name: Kerry Cabrera MRN: [id number] Date of Birth: 25-Apr-1928 Referring Provider:  Danella Penton, MD  Encounter Date: 12/16/2014      PT End of Session - 12/16/14 1305    Visit Number 2   Number of Visits 9   Date for PT Re-Evaluation 01/13/15   Authorization Type 2   Authorization Time Period of 10   PT Start Time 1300   PT Stop Time 1345   PT Time Calculation (min) 45 min   Activity Tolerance Patient tolerated treatment well   Behavior During Therapy [facility] for tasks assessed/performed      Past Medical History  Diagnosis Date  . Depression   . TIA (transient ischemic attack)     Past Surgical History  Procedure Laterality Date  . Total hip arthroplasty Right 02/06/2014    There were no vitals filed for this visit.  Visit Diagnosis:  Right hip pain  Right-sided low back pain without sciatica  Arthralgia of both knees  Weakness  Difficulty walking      Subjective Assessment - 12/16/14 1305    Subjective Pt states that she got the arch supports from the store yesterday. Feels uncomfortable. Feels better after flipping them right side up and proper shoe. R hip and back pain felt better after last session.    Patient Stated Goals Pt expresses desire to decrease her R hip pain.    Currently in Pain? No/denies   Multiple Pain Sites No           [facility] Adult PT Treatment/Exercise - 12/16/14 1310    Exercises   Other Exercises  Upon observation, arch supports were upside down and in the wrong shoe. Corrected placement which felt more comfortable for pt and knees. Directed patient with seated manually resisted knee flexion targeting the medial hamstrings 10x2 each LE, seated manually resisted clamshells 10x3, supine lower trunk rotation 10x3 each side, supine  bilateral horizontal abduction (to promote thoracic extension) 10x3, wedding march holding onto 3 lbs each hand 32 ft x 2, side stepping holding onto 3 lbs weight 32 ft x2 each side, standing bilateral scapular retraction using yellow band 10x to promote thoracic extension.            PT Education - 12/16/14 1329    Education provided Yes   Education Details ther-ex, HEP   Person(s) Educated Patient   Methods Explanation;Demonstration;Tactile cues;Verbal cues;Handout   Comprehension Verbalized understanding;Returned demonstration             PT Long Term Goals - 12/14/14 1952    PT LONG TERM GOAL #1   Title Pt will be independend with her HEP to promote ability to ambulate and get up from a chair with less hip/back pain.   Time 4   Period Weeks   Status New   PT LONG TERM GOAL #2   Title Patient will improve her LEFS score by at least 9 points as a demonstration of improved function.    Time 4   Period Weeks   Status New   PT LONG TERM GOAL #3   Title Patient will improve bilateral hip strength by 1/2 MMT grade to promote femoral control and help decrease back pain when walking or standing up from a chair.    Time 4   Period Weeks  Status New   PT LONG TERM GOAL #4   Title Pt will have a decrease in R hip pain to 4/10 or less at worst to promote ability to ambulate for longer periods.    Baseline 7/10 R hip pain walking 60 ft   Time 4   Period Weeks   Status New   PT LONG TERM GOAL #5   Title Patient will have a decrease in bilateral knee pain to 6/10 or less at worst to promote ability to ambulate longer distances   Time 4   Period Weeks   Status New               Plan - 12/16/14 1315    Clinical Impression Statement Improved comfort when arch support position in shoe was corrected. Tolerated session without complain of increased R hip/low back or bilateral knee pain.   Pt will benefit from skilled therapeutic intervention in order to improve on the  following deficits Abnormal gait;Difficulty walking;Pain;Improper body mechanics;Hypomobility;Decreased strength   Rehab Potential Good   Clinical Impairments Affecting Rehab Potential pain, bilateral genu valgus   PT Frequency 2x / week   PT Duration 4 weeks   PT Treatment/Interventions Therapeutic exercise;Manual techniques;Therapeutic activities;Electrical Stimulation;Iontophoresis /ml Dexamethasone;Cryotherapy;Gait training;Patient/family education;Neuromuscular re-education   PT Next Visit Plan manual therapy to low back, thoracic extension, bilateral hip strengthening, lumbar mobility   Consulted and Agree with Plan of Care Patient        Problem List There are no active problems to display for this patient.  Loralyn Freshwater PT, DPT   12/17/2014, 11:11 AM  [place] [facility] PHYS[facility] AND SPORTS MEDICINE [address] [place], [address] Phone: [phone]   Fax:  [phone]

## 2014-12-20 DIAGNOSIS — I340 Nonrheumatic mitral (valve) insufficiency: Secondary | ICD-10-CM | POA: Insufficient documentation

## 2014-12-21 ENCOUNTER — Ambulatory Visit: Payer: Medicare Other

## 2014-12-23 ENCOUNTER — Ambulatory Visit: Payer: Medicare Other

## 2014-12-23 DIAGNOSIS — M545 Low back pain, unspecified: Secondary | ICD-10-CM

## 2014-12-23 DIAGNOSIS — M25561 Pain in right knee: Secondary | ICD-10-CM

## 2014-12-23 DIAGNOSIS — R262 Difficulty in walking, not elsewhere classified: Secondary | ICD-10-CM

## 2014-12-23 DIAGNOSIS — R531 Weakness: Secondary | ICD-10-CM

## 2014-12-23 DIAGNOSIS — M25562 Pain in left knee: Secondary | ICD-10-CM

## 2014-12-23 DIAGNOSIS — M25551 Pain in right hip: Secondary | ICD-10-CM | POA: Diagnosis not present
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
## 2014-12-28 ENCOUNTER — Ambulatory Visit: Payer: Medicare Other

## 2014-12-28 DIAGNOSIS — M25551 Pain in right hip: Secondary | ICD-10-CM

## 2014-12-28 DIAGNOSIS — M545 Low back pain, unspecified: Secondary | ICD-10-CM

## 2014-12-28 DIAGNOSIS — R262 Difficulty in walking, not elsewhere classified: Secondary | ICD-10-CM

## 2014-12-28 DIAGNOSIS — M25561 Pain in right knee: Secondary | ICD-10-CM

## 2014-12-28 DIAGNOSIS — M25562 Pain in left knee: Secondary | ICD-10-CM

## 2014-12-28 DIAGNOSIS — R531 Weakness: Secondary | ICD-10-CM

## 2014-12-28 NOTE — Therapy (Signed)
[facility] [address], [place], [address] Phone: [phone]   Fax:  [phone]  Physical Therapy Treatment  Patient Details  Name: Kerry Cabrera MRN: [id number] Date of Birth: May 30, 1928 Referring Provider:  Danella Penton, MD  Encounter Date: 12/28/2014      PT End of Session - 12/28/14 1652    Visit Number 4   Number of Visits 9   Date for PT Re-Evaluation 01/13/15   Authorization Type 4   Authorization Time Period of 10   PT Start Time 1652   PT Stop Time 1733   PT Time Calculation (min) 41 min   Activity Tolerance Patient tolerated treatment well;Patient limited by fatigue   Behavior During Therapy [facility] for tasks assessed/performed      Past Medical History  Diagnosis Date  . Depression   . TIA (transient ischemic attack)     Past Surgical History  Procedure Laterality Date  . Total hip arthroplasty Right 02/06/2014    There were no vitals filed for this visit.  Visit Diagnosis:  Right hip pain  Right-sided low back pain without sciatica  Arthralgia of both knees  Weakness  Difficulty walking      Subjective Assessment - 12/28/14 1656    Subjective Pt states no R hip pain. No hip pain since this weekend. Difficulty standing up from a chair due to bilateral knee pain.    Patient Stated Goals Pt expresses desire to decrease her R hip pain.    Currently in Pain? Yes   Pain Score 6    Pain Location Knee   Pain Orientation --  bilateral          Objective:   There-ex:  Directed patient with supine lower trunk rotation 10x3 each side, supine bilateral horizontal abduction (to promote thoracic extension) 10x2 with 5 second holds,  Supine bridge 10x2 with red band resisting hip abduction/ER,  S/L clam shells 10x2 each LE resisting red band,  Sit <> stand from elevated mat table with red band resisting hip abduction/ER 10x wedding march holding onto 3 lbs each hand  32 ft x 2,  side stepping holding onto 3 lbs weight 32 ft x each side, Sit <> stand from elevated mat table multiple times with cues to place center of gravity over base of support.   Improved exercise technique, movement at target joints, use of target muscles after mod verbal, visual, tactile cues.    Pt demonstrates decreased femoral control with sit <> stand. Improved ability to perform sit <> stand from elevated mat table when pt leans forward to place her center of gravity over her base of support.               PT Education - 12/28/14 1659    Education provided Yes   Education Details ther-ex   [employer]) Educated Patient   Methods Explanation;Demonstration;Tactile cues;Verbal cues   Comprehension Verbalized understanding;Returned demonstration             PT Long Term Goals - 12/14/14 1952    PT LONG TERM GOAL #1   Title Pt will be independend with her HEP to promote ability to ambulate and get up from a chair with less hip/back pain.   Time 4   Period Weeks   Status New   PT LONG TERM GOAL #2   Title Patient will improve her LEFS score by at least 9 points as a demonstration of improved function.  Time 4   Period Weeks   Status New   PT LONG TERM GOAL #3   Title Patient will improve bilateral hip strength by 1/2 MMT grade to promote femoral control and help decrease back pain when walking or standing up from a chair.    Time 4   Period Weeks   Status New   PT LONG TERM GOAL #4   Title Pt will have a decrease in R hip pain to 4/10 or less at worst to promote ability to ambulate for longer periods.    Baseline 7/10 R hip pain walking 60 ft   Time 4   Period Weeks   Status New   PT LONG TERM GOAL #5   Title Patient will have a decrease in bilateral knee pain to 6/10 or less at worst to promote ability to ambulate longer distances   Time 4   Period Weeks   Status New               Plan - 12/28/14 1700    Clinical Impression Statement Pt  demonstrates decreased femoral control with sit <> stand. Improved ability to perform sit <> stand from elevated mat table when pt leans forward to place her center of gravity over her base of support.    Pt will benefit from skilled therapeutic intervention in order to improve on the following deficits Abnormal gait;Difficulty walking;Pain;Improper body mechanics;Hypomobility;Decreased strength   Rehab Potential Good   Clinical Impairments Affecting Rehab Potential pain, bilateral genu valgus   PT Frequency 2x / week   PT Duration 4 weeks   PT Treatment/Interventions Therapeutic exercise;Manual techniques;Therapeutic activities;Electrical Stimulation;Iontophoresis /ml Dexamethasone;Cryotherapy;Gait training;Patient/family education;Neuromuscular re-education   PT Next Visit Plan manual therapy to low back, thoracic extension, bilateral hip strengthening, lumbar mobility   Consulted and Agree with Plan of Care Patient        Problem List There are no active problems to display for this patient.   Loralyn Freshwater PT, DPT   12/28/2014, 7:35 PM  [facility] PHYSICAL AND SPORTS MEDICINE [address], [place], [address] Phone: [phone]   Fax:  [phone]

## 2014-12-30 ENCOUNTER — Ambulatory Visit: Payer: Medicare Other

## 2014-12-30 DIAGNOSIS — M25561 Pain in right knee: Secondary | ICD-10-CM

## 2014-12-30 DIAGNOSIS — M545 Low back pain, unspecified: Secondary | ICD-10-CM

## 2014-12-30 DIAGNOSIS — M25551 Pain in right hip: Secondary | ICD-10-CM

## 2014-12-30 DIAGNOSIS — R531 Weakness: Secondary | ICD-10-CM

## 2014-12-30 DIAGNOSIS — M25562 Pain in left knee: Secondary | ICD-10-CM

## 2014-12-30 DIAGNOSIS — R262 Difficulty in walking, not elsewhere classified: Secondary | ICD-10-CM

## 2014-12-30 NOTE — Patient Instructions (Signed)
   Scapular Retraction (Standing)   With arms at sides, pinch shoulder blades together. Hold for 5 seconds. Repeat __10__ times per set. Do __3__ sets per session.     Copyright  VHI. All rights reserved.    External Rotation: Retraction (Multiple Resist)

## 2014-12-30 NOTE — Therapy (Signed)
[facility] [address] [place], [address] Phone: [phone]   Fax:  [phone]  Physical Therapy Treatment  Patient Details  Name: Kerry Cabrera MRN: [id number] Date of Birth: [birth year] Referring Provider:  Danella Penton, MD  Encounter Date: 12/30/2014      PT End of Session - 12/30/14 1436    Visit Number 5   Number of Visits 9   Date for PT Re-Evaluation 01/13/15   Authorization Type 5   Authorization Time Period of 10   PT Start Time 1436   PT Stop Time 1519   PT Time Calculation (min) 43 min   Activity Tolerance Patient tolerated treatment well;Patient limited by fatigue   Behavior During Therapy [facility] for tasks assessed/performed      Past Medical History  Diagnosis Date  . Depression   . TIA (transient ischemic attack)     Past Surgical History  Procedure Laterality Date  . Total hip arthroplasty Right 02/06/2014    There were no vitals filed for this visit.  Visit Diagnosis:  Right hip pain  Right-sided low back pain without sciatica  Arthralgia of both knees  Weakness  Difficulty walking      Subjective Assessment - 12/30/14 1438    Subjective 2/10 bilateral knees currently. Pt states that her knees felt better this morning. No R hip pain or discomfort. Pt states she wants to do the same treatment today as last time.     Patient Stated Goals Pt expresses desire to decrease her R hip pain.    Currently in Pain? Yes   Pain Score 2    Multiple Pain Sites No      There-ex:  Directed patient with supine lower trunk rotation 10x3 each side, supine bilateral shoulder horizontal abduction (to promote thoracic extension) 10x then 10x2 with 5 second holds,  Standing bilateral scapular retraction resisting yellow band 5x5 seconds then 5x no holds, Standing bilateral scapular retraction no band 10x2 with 5 second holds (given and reviewed as part of HEP; pt demonstrated and  verbalized understanding),   Supine bridge 10x2 with red band resisting hip abduction/ER,  S/L clam shells 10x2 each LE resisting red band with rest breaks as needed,   Sit <> stand from elevated mat table with red band resisting hip abduction/ER 10x wedding march holding onto 5 lbs each hand 32 ft x 2,  side stepping holding onto 5 lbs weight 32 ft x each side,    Improved exercise technique, movement at target joints, use of target muscles after mod verbal, visual, tactile cues.            PT Long Term Goals - 12/14/14 1952    PT LONG TERM GOAL #1   Title Pt will be independend with her HEP to promote ability to ambulate and get up from a chair with less hip/back pain.   Time 4   Period Weeks   Status New   PT LONG TERM GOAL #2   Title Patient will improve her LEFS score by at least 9 points as a demonstration of improved function.    Time 4   Period Weeks   Status New   PT LONG TERM GOAL #3   Title Patient will improve bilateral hip strength by 1/2 MMT grade to promote femoral control and help decrease back pain when walking or standing up from a chair.    Time 4   Period Weeks  Status New   PT LONG TERM GOAL #4   Title Pt will have a decrease in R hip pain to 4/10 or less at worst to promote ability to ambulate for longer periods.    Baseline 7/10 R hip pain walking 60 ft   Time 4   Period Weeks   Status New   PT LONG TERM GOAL #5   Title Patient will have a decrease in bilateral knee pain to 6/10 or less at worst to promote ability to ambulate longer distances   Time 4   Period Weeks   Status New               Plan - 12/30/14 1446    Clinical Impression Statement Tolerated session well without aggravation of bilateral knee pain. No complain of R hip pain throughout session. Pt also demonstrates difficulty with use of R glute med during S/L clam shells. Improving femoral control with sit <> stand from elevated mat table when resisting red theraband.     Pt will benefit from skilled therapeutic intervention in order to improve on the following deficits Abnormal gait;Difficulty walking;Pain;Improper body mechanics;Hypomobility;Decreased strength   Rehab Potential Good   Clinical Impairments Affecting Rehab Potential pain, bilateral genu valgus   PT Frequency 2x / week   PT Duration 4 weeks   PT Treatment/Interventions Therapeutic exercise;Manual techniques;Therapeutic activities;Electrical Stimulation;Iontophoresis /ml Dexamethasone;Cryotherapy;Gait training;Patient/family education;Neuromuscular re-education   PT Next Visit Plan manual therapy to low back, thoracic extension, bilateral hip strengthening, lumbar mobility   Consulted and Agree with Plan of Care Patient        Problem List There are no active problems to display for this patient.   Loralyn Freshwater PT, DPT   12/30/2014, 3:36 PM  [facility] PHYS[facility] AND SPORTS MEDICINE [address], [place], [address] Phone: [phone]   Fax:  [phone]

## 2015-01-04 ENCOUNTER — Ambulatory Visit: Payer: Medicare Other

## 2015-01-04 DIAGNOSIS — M25551 Pain in right hip: Secondary | ICD-10-CM | POA: Diagnosis not present

## 2015-01-04 DIAGNOSIS — R531 Weakness: Secondary | ICD-10-CM

## 2015-01-04 DIAGNOSIS — M25562 Pain in left knee: Secondary | ICD-10-CM

## 2015-01-04 DIAGNOSIS — M545 Low back pain, unspecified: Secondary | ICD-10-CM

## 2015-01-04 DIAGNOSIS — M25561 Pain in right knee: Secondary | ICD-10-CM

## 2015-01-04 DIAGNOSIS — R262 Difficulty in walking, not elsewhere classified: Secondary | ICD-10-CM

## 2015-01-04 NOTE — Patient Instructions (Signed)
HIP: Abduction / External Rotation (Band)  Please count out loud (for breaths)  Place band around knees. Lie on side with hips and knees bent. Raise top knee up, squeezing glutes. Keep feet together. Use ____red____ band. __5_ reps per set each side, _3__ sets per day   Copyright  VHI. All rights reserved.    Abductor Strength: Bridge Pose (Strap)   Count out loud for breaths.   Make strap wide enough to brace knees at hip width. Press into strap with knees. Lift hips up. Repeat __10__ times. Do 2 sets.   Copyright  VHI. All rights reserved.

## 2015-01-04 NOTE — Therapy (Signed)
[facility] [facility]TS [facility] [address], [place], [address] Phone: [phone]   Fax:  [phone]  Physical Therapy Treatment  Patient Details  Name: Kerry Cabrera MRN: [id number] Date of Birth: 12-Mar-1929 Referring Provider:  Danella Penton, MD  Encounter Date: 01/04/2015      PT End of Session - 01/04/15 1645    Visit Number 6   Number of Visits 9   Date for PT Re-Evaluation 01/13/15   Authorization Type 6   Authorization Time Period of 10   PT Start Time 1645   PT Stop Time 1732   PT Time Calculation (min) 47 min   Activity Tolerance Patient tolerated treatment well;Patient limited by fatigue   Behavior During Therapy [facility] for tasks assessed/performed      Past Medical History  Diagnosis Date  . Depression   . TIA (transient ischemic attack)     Past Surgical History  Procedure Laterality Date  . Total hip arthroplasty Right 02/06/2014    There were no vitals filed for this visit.  Visit Diagnosis:  Right hip pain  Right-sided low back pain without sciatica  Arthralgia of both knees  Weakness  Difficulty walking      Subjective Assessment - 01/04/15 1645    Subjective Pt states both knees are doing better. 2/10 when standing up from a chair which is the movement that bothers her the most. R hip not bothering her.    Patient Stated Goals Pt expresses desire to decrease her R hip pain.    Currently in Pain? Yes   Pain Score 2    Pain Location --  bilateral knees            [facility] PT Assessment - 01/04/15 1921    Observation/Other Assessments   Lower Extremity Functional Scale  41/80      Objectives: There-ex:  Directed patient with supine lower trunk rotation 10x3 each side,  supine bilateral shoulder horizontal abduction (to promote thoracic extension) 10x then 10x2 with 5 second holds,   Standing bilateral scapular retraction no band 10x3 with 5 second holds,  Supine bridge  10x2 with red band resisting hip abduction/ER,   S/L clam shells 5x4 each LE resisting red band,  Sit <> stand from elevated mat table with red band resisting hip abduction/ER 5x2  Reviewed HEP with pt who demonstrated and verbalized understanding.   Improved exercise technique, movement at target joints, use of target muscles after mod verbal, visual, tactile cues.      Patient making very good progress towards goals, decreased bilateral knee pain, and increasing function. Pt doing well towards alleviating R hip pain and has not complained of hip pain for the past few sessions. Improved bilateral femoral control with sit <> stand from elevated table with visual and verbal cues as well as when resisting red band for hip abduction/ER. Pt also better able to transfer body weight over her feet when standing up from elevated mat table compared to previous sessions.                      PT Education - 01/04/15 1921    Education provided Yes   Education Details ther-ex, HEP   Person(s) Educated Patient   Methods Explanation;Demonstration;Tactile cues;Verbal cues;Handout   Comprehension Returned demonstration;Verbalized understanding             PT Long Term Goals - 12/14/14 1952    PT LONG TERM GOAL #  1   Title Pt will be independend with her HEP to promote ability to ambulate and get up from a chair with less hip/back pain.   Time 4   Period Weeks   Status New   PT LONG TERM GOAL #2   Title Patient will improve her LEFS score by at least 9 points as a demonstration of improved function.    Time 4   Period Weeks   Status New   PT LONG TERM GOAL #3   Title Patient will improve bilateral hip strength by 1/2 MMT grade to promote femoral control and help decrease back pain when walking or standing up from a chair.    Time 4   Period Weeks   Status New   PT LONG TERM GOAL #4   Title Pt will have a decrease in R hip pain to 4/10 or less at worst to promote ability  to ambulate for longer periods.    Baseline 7/10 R hip pain walking 60 ft   Time 4   Period Weeks   Status New   PT LONG TERM GOAL #5   Title Patient will have a decrease in bilateral knee pain to 6/10 or less at worst to promote ability to ambulate longer distances   Time 4   Period Weeks   Status New               Plan - 01/04/15 1720    Clinical Impression Statement Patient making very good progress towards goals, decreased bilateral knee pain, and increasing function. Pt doing well towards alleviating R hip pain and has not complained of hip pain for the past few sessions. Improved bilateral femoral control with sit <> stand from elevated table with visual and verbal cues as well as when resisting red band for hip abduction/ER. Pt also better able to transfer body weight over her feet when standing up from elevated mat table compared to previous sessions.     Pt will benefit from skilled therapeutic intervention in order to improve on the following deficits Abnormal gait;Difficulty walking;Pain;Improper body mechanics;Hypomobility;Decreased strength   Rehab Potential Good   Clinical Impairments Affecting Rehab Potential pain, bilateral genu valgus   PT Frequency 2x / week   PT Duration 4 weeks   PT Treatment/Interventions Therapeutic exercise;Manual techniques;Therapeutic activities;Electrical Stimulation;Iontophoresis /ml Dexamethasone;Cryotherapy;Gait training;Patient/family education;Neuromuscular re-education   PT Next Visit Plan manual therapy to low back, thoracic extension, bilateral hip strengthening, lumbar mobility   Consulted and Agree with Plan of Care Patient        Problem List There are no active problems to display for this patient.  Loralyn Freshwater PT, DPT  01/04/2015, 7:29 PM  [facility] PHYSICAL [facility] SPORTS MEDICINE [address] [place], [address] Phone: [phone]   Fax:  [phone]

## 2015-01-06 ENCOUNTER — Ambulatory Visit: Payer: Medicare Other

## 2015-01-06 DIAGNOSIS — M25561 Pain in right knee: Secondary | ICD-10-CM

## 2015-01-06 DIAGNOSIS — M25551 Pain in right hip: Secondary | ICD-10-CM | POA: Diagnosis not present

## 2015-01-06 DIAGNOSIS — R531 Weakness: Secondary | ICD-10-CM

## 2015-01-06 DIAGNOSIS — M25562 Pain in left knee: Secondary | ICD-10-CM

## 2015-01-06 DIAGNOSIS — M545 Low back pain, unspecified: Secondary | ICD-10-CM

## 2015-01-06 DIAGNOSIS — R262 Difficulty in walking, not elsewhere classified: Secondary | ICD-10-CM

## 2015-01-06 NOTE — Therapy (Signed)
[place] [facility]TS MED[facility] [address] [place], [address] Phone: [phone]   Fax:  [phone]  Physical Therapy Treatment and Discharge Summary  Patient Details  Name: Kerry Cabrera MRN: [id number] Date of Birth: 1929/02/06 Referring Provider:  Danella Penton, MD  Encounter Date: 01/06/2015      PT End of Session - 01/06/15 1646    Visit Number 7   Number of Visits 9   Date for PT Re-Evaluation 01/13/15   Authorization Type 7   Authorization Time Period of 10   PT Start Time 1647   PT Stop Time 1732   PT Time Calculation (min) 45 min   Activity Tolerance Patient tolerated treatment well;Patient limited by fatigue   Behavior During Therapy [facility] for tasks assessed/performed      Past Medical History  Diagnosis Date  . Depression   . TIA (transient ischemic attack)     Past Surgical History  Procedure Laterality Date  . Total hip arthroplasty Right 02/06/2014    There were no vitals filed for this visit.  Visit Diagnosis:  Right hip pain  Right-sided low back pain without sciatica  Arthralgia of both knees  Weakness  Difficulty walking      Subjective Assessment - 01/06/15 1650    Subjective Pt states not having knee pain or hip pain. Doing exercises at home.    Patient Stated Goals Pt expresses desire to decrease her R hip pain.    Currently in Pain? No/denies   Pain Score 0-No pain   Multiple Pain Sites No            [facility] PT Assessment - 01/06/15 1722    Observation/Other Assessments   Lower Extremity Functional Scale  41/80  (01/04/15)   Strength   Right Hip Extension 3+/5   Right Hip ABduction 4/5   Left Hip Extension 4/5   Left Hip ABduction 4+/5     Objectives: There-ex:  Directed patient with supine lower trunk rotation 10x3 each side,  supine bilateral shoulder horizontal abduction (to promote thoracic extension) 10x3 with 5 second holds,   Supine bridge 10x3 with  red band resisting hip abduction/ER,   S/L clam shells 5x4 each LE resisting red band,  Sit <> stand from elevated mat table with red band resisting hip abduction/ER 3x   Standing bilateral scapular retraction resisting red band 10x2 with 5 second holds,  Manually resisted: S/L hip abduction and prone glute max extension 1-2x each way for each LE. Reviewed current status with LE strength and progress with LEFS score with pt.    Improved exercise technique, movement at target joints, use of target muscles after min verbal, visual, tactile cues.    Patient has demonstrated very good progress towards goals with no complain of knee pain or hip pain throughout session. Pt also demonstrates improved LEFS score since initial evaluation suggesting improved function. Skilled physical services discharged with patient continuing progress with her HEP.                    PT Education - 01/06/15 1650    Education provided Yes   Education Details ther-ex   Person(s) Educated Patient   Methods Explanation;Demonstration;Verbal cues   Comprehension Verbalized understanding;Returned demonstration             PT Long Term Goals - 01/06/15 1743    PT LONG TERM GOAL #1   Title Pt will be independend with her HEP  to promote ability to ambulate and get up from a chair with less hip/back pain.   Time 4   Period Weeks   Status Achieved   PT LONG TERM GOAL #2   Title Patient will improve her LEFS score by at least 9 points as a demonstration of improved function.    Time 4   Period Weeks   Status Achieved   PT LONG TERM GOAL #3   Title Patient will improve bilateral hip strength by 1/2 MMT grade to promote femoral control and help decrease back pain when walking or standing up from a chair.    Time 4   Period Weeks   Status On-going   PT LONG TERM GOAL #4   Title Pt will have a decrease in R hip pain to 4/10 or less at worst to promote ability to ambulate for longer periods.     Baseline --   Time 4   Period Weeks   Status Achieved   PT LONG TERM GOAL #5   Title Patient will have a decrease in bilateral knee pain to 6/10 or less at worst to promote ability to ambulate longer distances   Time 4   Period Weeks   Status Achieved               Plan - 2015-01-07 1651    Clinical Impression Statement Patient has demonstrated very good progress towards goals with no complain of knee pain or hip pain throughout session. Pt also demonstrates improved LEFS score since initial evaluation suggesting improved function. Skilled physical services discharged with patient continuing progress with her HEP.    Pt will benefit from skilled therapeutic intervention in order to improve on the following deficits Abnormal gait;Difficulty walking;Pain;Improper body mechanics;Hypomobility;Decreased strength   Rehab Potential Good   Clinical Impairments Affecting Rehab Potential    PT Frequency    PT Duration    PT Treatment/Interventions Therapeutic exercise;Manual techniques;Therapeutic activities;Electrical Stimulation;Iontophoresis /ml Dexamethasone;Cryotherapy;Gait training;Patient/family education;Neuromuscular re-education   PT Next Visit Plan    Consulted and Agree with Plan of Care Patient          G-Codes - January 07, 2015 1839    Functional Assessment Tool Used LEFS, pt interview, clinical presentation, clinical judgement   Functional Limitation Mobility: Walking and moving around   Mobility: Walking and Moving Around Goal Status [phone] At least 20 percent but less than 40 percent impaired, limited or restricted   Mobility: Walking and Moving Around Discharge Status [phone]) At least 20 percent but less than 40 percent impaired, limited or restricted      Problem List There are no active problems to display for this patient.  Loralyn Freshwater PT, DPT  Jan 07, 2015, 6:40 PM  [facility] PHYSICAL AND SPORTS MEDICINE [address] [place], [address] Phone: [phone]   Fax:  [phone]

## 2015-05-31 ENCOUNTER — Other Ambulatory Visit: Payer: Self-pay | Admitting: Internal Medicine

## 2015-05-31 DIAGNOSIS — R55 Syncope and collapse: Secondary | ICD-10-CM

## 2015-06-03 ENCOUNTER — Ambulatory Visit
Admission: RE | Admit: 2015-06-03 | Discharge: 2015-06-03 | Disposition: A | Discharge status: Home / Self Care | Payer: Medicare Other | Source: Ambulatory Visit | Attending: Internal Medicine | Admitting: Internal Medicine

## 2015-06-03 DIAGNOSIS — I6523 Occlusion and stenosis of bilateral carotid arteries: Secondary | ICD-10-CM | POA: Diagnosis not present

## 2015-06-03 DIAGNOSIS — R55 Syncope and collapse: Secondary | ICD-10-CM | POA: Insufficient documentation

## 2015-07-27 ENCOUNTER — Encounter
Admission: RE | Admit: 2015-07-27 | Discharge: 2015-07-27 | Disposition: A | Discharge status: Home / Self Care | Payer: Medicare Other | Source: Ambulatory Visit | Attending: Orthopedic Surgery | Admitting: Orthopedic Surgery

## 2015-07-27 DIAGNOSIS — Z01812 Encounter for preprocedural laboratory examination: Secondary | ICD-10-CM | POA: Diagnosis present

## 2015-07-27 HISTORY — DX: Hypothyroidism, unspecified: E03.9

## 2015-07-27 HISTORY — DX: Atherosclerotic heart disease of native coronary artery without angina pectoris: I25.10

## 2015-07-27 HISTORY — DX: Peptic ulcer, site unspecified, unspecified as acute or chronic, without hemorrhage or perforation: K27.9

## 2015-07-27 HISTORY — DX: Other complications of anesthesia, initial encounter: T88.59XA

## 2015-07-27 HISTORY — DX: Occlusion and stenosis of unspecified carotid artery: I65.29

## 2015-07-27 HISTORY — DX: Unspecified osteoarthritis, unspecified site: M19.90

## 2015-07-27 HISTORY — DX: Hyperlipidemia, unspecified: E78.5

## 2015-07-27 HISTORY — DX: Adverse effect of unspecified anesthetic, initial encounter: T41.45XA

## 2015-07-27 HISTORY — DX: Insomnia, unspecified: G47.00

## 2015-07-27 HISTORY — DX: Essential (primary) hypertension: I10

## 2015-07-27 LAB — CBC
HCT: 42.7 % (ref 35.0–47.0)
Hemoglobin: 14.2 g/dL (ref 12.0–16.0)
MCH: 30 pg (ref 26.0–34.0)
MCHC: 33.3 g/dL (ref 32.0–36.0)
MCV: 90.2 fL (ref 80.0–100.0)
PLATELETS: 218 10*3/uL (ref 150–440)
RBC: 4.73 MIL/uL (ref 3.80–5.20)
RDW: 14.4 % (ref 11.5–14.5)
WBC: 8.5 10*3/uL (ref 3.6–11.0)

## 2015-07-27 LAB — COMPREHENSIVE METABOLIC PANEL
ALT: 20 U/L (ref 14–54)
AST: 21 U/L (ref 15–41)
Albumin: 4.1 g/dL (ref 3.5–5.0)
Alkaline Phosphatase: 81 U/L (ref 38–126)
Anion gap: 8 (ref 5–15)
BUN: 21 mg/dL — ABNORMAL HIGH (ref 6–20)
CHLORIDE: 106 mmol/L (ref 101–111)
CO2: 25 mmol/L (ref 22–32)
Calcium: 9.2 mg/dL (ref 8.9–10.3)
Creatinine, Ser: 0.88 mg/dL (ref 0.44–1.00)
GFR, EST NON AFRICAN AMERICAN: 59 mL/min — AB (ref >60)
Glucose, Bld: 89 mg/dL (ref 65–99)
POTASSIUM: 4.1 mmol/L (ref 3.5–5.1)
SODIUM: 139 mmol/L (ref 135–145)
Total Bilirubin: 0.3 mg/dL (ref 0.3–1.2)
Total Protein: 7 g/dL (ref 6.5–8.1)

## 2015-07-27 LAB — URINALYSIS COMPLETE WITH MICROSCOPIC (ARMC ONLY)
BACTERIA UA: NONE SEEN
Bilirubin Urine: NEGATIVE
GLUCOSE, UA: NEGATIVE mg/dL
HGB URINE DIPSTICK: NEGATIVE
Ketones, ur: NEGATIVE mg/dL
Leukocytes, UA: NEGATIVE
Nitrite: NEGATIVE
Protein, ur: NEGATIVE mg/dL
Specific Gravity, Urine: 1.015 (ref 1.005–1.030)
pH: 5 (ref 5.0–8.0)

## 2015-07-27 LAB — MRSA PCR SCREENING: MRSA BY PCR: NEGATIVE

## 2015-07-27 LAB — PROTIME-INR
INR: 0.96
PROTHROMBIN TIME: 13 s (ref 11.4–15.0)

## 2015-07-27 LAB — SEDIMENTATION RATE: SED RATE: 7 mm/h (ref 0–30)

## 2015-07-27 LAB — TYPE AND SCREEN
ABO/RH(D): O POS
ANTIBODY SCREEN: NEGATIVE

## 2015-07-27 LAB — APTT: aPTT: 28 seconds (ref 24–36)

## 2015-07-27 NOTE — Patient Instructions (Signed)
  Your procedure is scheduled on: 08/08/15 Monday Report to Day Surgery. To find out your arrival time please call [phone] between 1PM - 3PM on .Friday 4/28  Remember: Instructions that are not followed completely may result in serious medical risk, up to and including death, or upon the discretion of your surgeon and anesthesiologist your surgery may need to be rescheduled.    ___x_ 1. Do not eat food or drink liquids after midnight. No gum chewing or hard candies.     ___x_ 2. No Alcohol for 24 hours before or after surgery.   ____ 3. Bring all medications with you on the day of surgery if instructed.    ___x_ 4. Notify your doctor if there is any change in your medical condition     (cold, fever, infections).     Do not wear jewelry, make-up, hairpins, clips or nail polish.  Do not wear lotions, powders, or perfumes. You may wear deodorant.  Do not shave 48 hours prior to surgery. Men may shave face and neck.  Do not bring valuables to the hospital.    [facility] not [facility] for any belongings or valuables.               Contacts, dentures or bridgework may not be worn into surgery.  Leave your suitcase in the car. After surgery it may be brought to your room.  For patients admitted to the hospital, discharge time is determined by your                treatment team.   Patients discharged the day of surgery will not be allowed to drive home.   Please read over the following fact sheets that you were given:   MRSA Information and Surgical Site Infection Prevention   ____ Take these medicines the morning of surgery with A SIP OF WATER:    1. Levothyroxine  2. Wellbutrin  3. Tylenol if needed  4.  5.  6.  ____ Fleet Enema (as directed)   __x__ Use CHG Soap as directed  ____ Use inhalers on the day of surgery  ____ Stop metformin 2 days prior to surgery    ____ Take 1/2 of usual insulin dose the night before surgery and none on the morning of surgery.    __x__ Stop Coumadin/Plavix/aspirin on 2/24  _x___ Stop Anti-inflammatories on tylenol only until surgery   __x__ Stop supplements until after surgery 2/24.    ____ Bring C-Pap to the hospital.

## 2015-07-28 LAB — SURGICAL PCR SCREEN
MRSA, PCR: NEGATIVE
STAPHYLOCOCCUS AUREUS: NEGATIVE

## 2015-07-28 LAB — ABO/RH: ABO/RH(D): O POS

## 2015-07-29 LAB — URINE CULTURE: Special Requests: NORMAL

## 2015-07-29 NOTE — Pre-Procedure Instructions (Signed)
Urine culture results sent to Dr. Ernest PineHooten for review.  Lab suggests recollection.  Asked if wanted this done and / or treated?

## 2015-08-01 NOTE — Pre-Procedure Instructions (Signed)
Per Dr. Elenor LegatoHooten's office regarding UA and urine culture results, no recollection of specimen is required.

## 2015-08-02 ENCOUNTER — Ambulatory Visit (INDEPENDENT_AMBULATORY_CARE_PROVIDER_SITE_OTHER): Payer: Medicare Other | Admitting: Podiatry

## 2015-08-02 ENCOUNTER — Encounter: Payer: Self-pay | Admitting: Podiatry

## 2015-08-02 VITALS — BP 127/74 | HR 77 | Resp 18

## 2015-08-02 DIAGNOSIS — B351 Tinea unguium: Secondary | ICD-10-CM

## 2015-08-02 DIAGNOSIS — M79676 Pain in unspecified toe(s): Secondary | ICD-10-CM

## 2015-08-02 NOTE — Progress Notes (Signed)
   Subjective:    Patient ID: Kerry GentilePatti B Cabrera, female    DOB: 03/05/1929, 82 y.o.   MRN: [id number]  HPI  82 year old female presents the office today for concerns of thick, painful, elongated toenails that she cannot trim herself. Denies any redness or dainage. No recent injury or trauma. She is scheduled have a knee replacement on Monday and would like to have her nails trimmed before then. No other complaints at this time.   Review of Systems  All other systems reviewed and are negative.      Objective:   Physical Exam General: AAO x3, NAD  Dermatological: Nails are hypertrophic, dystrophic, brittle, discolored, elongated 10. There is tenderness to nails 1-5 bilaterally. No surrounding erythema or drainage. No open lesions or pre-ulcerative lesions are identified at this time.  Vascular: Dorsalis Pedis artery and Posterior Tibial artery pedal pulses are 2/4 bilateral with immedate capillary fill time. Pedal hair growth present. No varicosities and no lower extremity edema present bilateral. There is no pain with calf compression, swelling, warmth, erythema.   Neruologic: Grossly intact via light touch bilateral. Vibratory intact via tuning fork bilateral. Protective threshold with Semmes Wienstein monofilament intact to all pedal sites bilateral. Patellar and Achilles deep tendon reflexes 2+ bilateral. No Babinski or clonus noted bilateral.   Musculoskeletal: No gross boney pedal deformities bilateral. No pain, crepitus, or limitation noted with foot and ankle range of motion bilateral. Muscular strength 5/5 in all groups tested bilateral.  Gait: Unassisted, Nonantalgic.      Assessment & Plan:  82 year old female with symptomatic onychomycosis -Treatment options discussed including all alternatives, risks, and complications -Etiology of symptoms were discussed -Nails debrided 10 without complications or bleeding. -Daily foot inspection -Follow-up as needed. In the meantime,  encouraged to call the office with any questions, concerns, change in symptoms.   Ovid CurdMatthew Patsy Zaragoza, DPM

## 2015-08-08 ENCOUNTER — Encounter
Admission: RE | Disposition: A | Discharge status: Home / Self Care | Payer: Self-pay | Source: Ambulatory Visit | Attending: Orthopedic Surgery

## 2015-08-08 ENCOUNTER — Inpatient Hospital Stay: Payer: Medicare Other | Admitting: Certified Registered Nurse Anesthetist

## 2015-08-08 ENCOUNTER — Encounter: Payer: Self-pay | Admitting: Orthopedic Surgery

## 2015-08-08 ENCOUNTER — Inpatient Hospital Stay: Payer: Medicare Other

## 2015-08-08 ENCOUNTER — Inpatient Hospital Stay
Admission: RE | Admit: 2015-08-08 | Discharge: 2015-08-11 | DRG: 470 | Disposition: A | Discharge status: Home / Self Care | Payer: Medicare Other | Source: Ambulatory Visit | Attending: Orthopedic Surgery | Admitting: Orthopedic Surgery

## 2015-08-08 DIAGNOSIS — I2510 Atherosclerotic heart disease of native coronary artery without angina pectoris: Secondary | ICD-10-CM | POA: Diagnosis present

## 2015-08-08 DIAGNOSIS — Z882 Allergy status to sulfonamides status: Secondary | ICD-10-CM | POA: Diagnosis not present

## 2015-08-08 DIAGNOSIS — Z96641 Presence of right artificial hip joint: Secondary | ICD-10-CM | POA: Diagnosis present

## 2015-08-08 DIAGNOSIS — E039 Hypothyroidism, unspecified: Secondary | ICD-10-CM | POA: Diagnosis present

## 2015-08-08 DIAGNOSIS — Z8673 Personal history of transient ischemic attack (TIA), and cerebral infarction without residual deficits: Secondary | ICD-10-CM

## 2015-08-08 DIAGNOSIS — Z8261 Family history of arthritis: Secondary | ICD-10-CM | POA: Diagnosis not present

## 2015-08-08 DIAGNOSIS — I10 Essential (primary) hypertension: Secondary | ICD-10-CM | POA: Diagnosis present

## 2015-08-08 DIAGNOSIS — Z8249 Family history of ischemic heart disease and other diseases of the circulatory system: Secondary | ICD-10-CM

## 2015-08-08 DIAGNOSIS — Z9889 Other specified postprocedural states: Secondary | ICD-10-CM | POA: Diagnosis not present

## 2015-08-08 DIAGNOSIS — Z7982 Long term (current) use of aspirin: Secondary | ICD-10-CM

## 2015-08-08 DIAGNOSIS — Z833 Family history of diabetes mellitus: Secondary | ICD-10-CM

## 2015-08-08 DIAGNOSIS — M1712 Unilateral primary osteoarthritis, left knee: Principal | ICD-10-CM | POA: Diagnosis present

## 2015-08-08 DIAGNOSIS — Z886 Allergy status to analgesic agent status: Secondary | ICD-10-CM

## 2015-08-08 DIAGNOSIS — Z87891 Personal history of nicotine dependence: Secondary | ICD-10-CM

## 2015-08-08 DIAGNOSIS — R451 Restlessness and agitation: Secondary | ICD-10-CM | POA: Diagnosis present

## 2015-08-08 DIAGNOSIS — Z800 Family history of malignant neoplasm of digestive organs: Secondary | ICD-10-CM | POA: Diagnosis not present

## 2015-08-08 DIAGNOSIS — Z79899 Other long term (current) drug therapy: Secondary | ICD-10-CM | POA: Diagnosis not present

## 2015-08-08 DIAGNOSIS — Z8711 Personal history of peptic ulcer disease: Secondary | ICD-10-CM | POA: Diagnosis not present

## 2015-08-08 DIAGNOSIS — Z96659 Presence of unspecified artificial knee joint: Secondary | ICD-10-CM

## 2015-08-08 DIAGNOSIS — Z888 Allergy status to other drugs, medicaments and biological substances status: Secondary | ICD-10-CM

## 2015-08-08 HISTORY — PX: KNEE ARTHROPLASTY: SHX992

## 2015-08-08 SURGERY — ARTHROPLASTY, KNEE, TOTAL, USING IMAGELESS COMPUTER-ASSISTED NAVIGATION
Anesthesia: Spinal | Laterality: Left | Wound class: Clean

## 2015-08-08 MED ORDER — PHENOL 1.4 % MT LIQD
1.0000 | OROMUCOSAL | Status: DC | PRN
  Filled 2015-08-08: qty 177

## 2015-08-08 MED ORDER — TRAMADOL HCL 50 MG PO TABS
50.0000 mg | ORAL_TABLET | ORAL | Status: DC | PRN
  Administered 2015-08-09: 50 mg via ORAL
  Administered 2015-08-10: 100 mg via ORAL
  Filled 2015-08-08: qty 1
  Filled 2015-08-08: qty 2

## 2015-08-08 MED ORDER — ONDANSETRON HCL 4 MG/2ML IJ SOLN: 4.0000 mg | Freq: Once | INTRAMUSCULAR | Status: DC | PRN

## 2015-08-08 MED ORDER — BUPIVACAINE LIPOSOME 1.3 % IJ SUSP
INTRAMUSCULAR | Status: AC
  Filled 2015-08-08: qty 20

## 2015-08-08 MED ORDER — MORPHINE SULFATE (PF) 2 MG/ML IV SOLN
2.0000 mg | INTRAVENOUS | Status: DC | PRN
  Administered 2015-08-08 (×2): 2 mg via INTRAVENOUS
  Filled 2015-08-08 (×3): qty 1

## 2015-08-08 MED ORDER — PROPOFOL 500 MG/50ML IV EMUL
INTRAVENOUS | Status: DC | PRN
  Administered 2015-08-08: 45 ug/kg/min via INTRAVENOUS

## 2015-08-08 MED ORDER — SODIUM CHLORIDE 0.9 % IV SOLN
INTRAVENOUS | Status: DC
  Administered 2015-08-08 – 2015-08-09 (×2): via INTRAVENOUS

## 2015-08-08 MED ORDER — LEVOTHYROXINE SODIUM 112 MCG PO TABS
112.0000 ug | ORAL_TABLET | Freq: Every day | ORAL | Status: DC
  Administered 2015-08-09 – 2015-08-11 (×3): 112 ug via ORAL
  Filled 2015-08-08 (×3): qty 1

## 2015-08-08 MED ORDER — VITAMIN B-12 1000 MCG PO TABS
1000.0000 ug | ORAL_TABLET | Freq: Every day | ORAL | Status: DC
  Administered 2015-08-08 – 2015-08-11 (×4): 1000 ug via ORAL
  Filled 2015-08-08 (×4): qty 1

## 2015-08-08 MED ORDER — LACTATED RINGERS IV SOLN
INTRAVENOUS | Status: DC | PRN
  Administered 2015-08-08 (×2): via INTRAVENOUS

## 2015-08-08 MED ORDER — ACETAMINOPHEN 10 MG/ML IV SOLN
1000.0000 mg | Freq: Four times a day (QID) | INTRAVENOUS | Status: AC
  Administered 2015-08-08 – 2015-08-09 (×4): 1000 mg via INTRAVENOUS
  Filled 2015-08-08 (×4): qty 100

## 2015-08-08 MED ORDER — PHENYLEPHRINE HCL 10 MG/ML IJ SOLN
INTRAMUSCULAR | Status: DC | PRN
  Administered 2015-08-08: 100 ug via INTRAVENOUS
  Administered 2015-08-08 (×2): 50 ug via INTRAVENOUS
  Administered 2015-08-08: 100 ug via INTRAVENOUS
  Administered 2015-08-08 (×2): 50 ug via INTRAVENOUS
  Administered 2015-08-08: 100 ug via INTRAVENOUS
  Administered 2015-08-08 (×2): 50 ug via INTRAVENOUS
  Administered 2015-08-08: 100 ug via INTRAVENOUS
  Administered 2015-08-08: 50 ug via INTRAVENOUS

## 2015-08-08 MED ORDER — TETRACAINE HCL 1 % IJ SOLN
INTRAMUSCULAR | Status: DC | PRN
  Administered 2015-08-08: 7.5 mg via INTRASPINAL

## 2015-08-08 MED ORDER — LIDOCAINE HCL (CARDIAC) 20 MG/ML IV SOLN
INTRAVENOUS | Status: DC | PRN
  Administered 2015-08-08: 40 mg via INTRAVENOUS

## 2015-08-08 MED ORDER — SENNOSIDES-DOCUSATE SODIUM 8.6-50 MG PO TABS
1.0000 | ORAL_TABLET | Freq: Two times a day (BID) | ORAL | Status: DC
  Administered 2015-08-08 – 2015-08-11 (×7): 1 via ORAL
  Filled 2015-08-08 (×7): qty 1

## 2015-08-08 MED ORDER — FUROSEMIDE 20 MG PO TABS: 20.0000 mg | ORAL_TABLET | Freq: Every day | ORAL | Status: DC | PRN

## 2015-08-08 MED ORDER — SODIUM CHLORIDE 0.9 % IJ SOLN
INTRAMUSCULAR | Status: AC
  Filled 2015-08-08: qty 50

## 2015-08-08 MED ORDER — FERROUS SULFATE 325 (65 FE) MG PO TABS
325.0000 mg | ORAL_TABLET | Freq: Two times a day (BID) | ORAL | Status: DC
  Administered 2015-08-08 – 2015-08-11 (×6): 325 mg via ORAL
  Filled 2015-08-08 (×7): qty 1

## 2015-08-08 MED ORDER — LACTATED RINGERS IV SOLN
INTRAVENOUS | Status: DC
  Administered 2015-08-08: 10:00:00 via INTRAVENOUS

## 2015-08-08 MED ORDER — BUPIVACAINE-EPINEPHRINE 0.25% -1:200000 IJ SOLN
INTRAMUSCULAR | Status: DC | PRN
  Administered 2015-08-08: 30 mL

## 2015-08-08 MED ORDER — SODIUM CHLORIDE 0.9 % IV SOLN
Freq: Once | INTRAVENOUS | Status: AC
  Administered 2015-08-08: 17:00:00 via INTRAVENOUS

## 2015-08-08 MED ORDER — FAMOTIDINE 20 MG PO TABS: 20.0000 mg | ORAL_TABLET | Freq: Once | ORAL | Status: DC

## 2015-08-08 MED ORDER — ACETAMINOPHEN 10 MG/ML IV SOLN
INTRAVENOUS | Status: AC
  Filled 2015-08-08: qty 100

## 2015-08-08 MED ORDER — MORPHINE SULFATE (PF) 2 MG/ML IV SOLN
2.0000 mg | INTRAVENOUS | Status: DC | PRN
  Administered 2015-08-08 – 2015-08-09 (×4): 2 mg via INTRAVENOUS
  Filled 2015-08-08 (×3): qty 1

## 2015-08-08 MED ORDER — CEFAZOLIN SODIUM-DEXTROSE 2-4 GM/100ML-% IV SOLN
INTRAVENOUS | Status: AC
  Administered 2015-08-08: 2 g via INTRAVENOUS
  Filled 2015-08-08: qty 100

## 2015-08-08 MED ORDER — ENOXAPARIN SODIUM 30 MG/0.3ML ~~LOC~~ SOLN
30.0000 mg | Freq: Two times a day (BID) | SUBCUTANEOUS | Status: DC
  Administered 2015-08-09 – 2015-08-11 (×5): 30 mg via SUBCUTANEOUS
  Filled 2015-08-08 (×5): qty 0.3

## 2015-08-08 MED ORDER — FLEET ENEMA 7-19 GM/118ML RE ENEM: 1.0000 | ENEMA | Freq: Once | RECTAL | Status: DC | PRN

## 2015-08-08 MED ORDER — CEFAZOLIN SODIUM-DEXTROSE 2-4 GM/100ML-% IV SOLN
2.0000 g | Freq: Four times a day (QID) | INTRAVENOUS | Status: AC
  Administered 2015-08-08 – 2015-08-09 (×4): 2 g via INTRAVENOUS
  Filled 2015-08-08 (×7): qty 100

## 2015-08-08 MED ORDER — CEFAZOLIN SODIUM-DEXTROSE 2-4 GM/100ML-% IV SOLN: 2.0000 g | INTRAVENOUS | Status: DC

## 2015-08-08 MED ORDER — ACETAMINOPHEN 650 MG RE SUPP
650.0000 mg | Freq: Four times a day (QID) | RECTAL | Status: DC | PRN
Start: 2015-08-08 — End: 2015-08-11

## 2015-08-08 MED ORDER — CHLORHEXIDINE GLUCONATE 4 % EX LIQD: 60.0000 mL | Freq: Once | CUTANEOUS | Status: DC

## 2015-08-08 MED ORDER — BUPROPION HCL ER (XL) 150 MG PO TB24
150.0000 mg | ORAL_TABLET | Freq: Every day | ORAL | Status: DC
  Administered 2015-08-08 – 2015-08-11 (×4): 150 mg via ORAL
  Filled 2015-08-08 (×4): qty 1

## 2015-08-08 MED ORDER — FAMOTIDINE 20 MG PO TABS
20.0000 mg | ORAL_TABLET | Freq: Once | ORAL | Status: DC
  Administered 2015-08-08: 20 mg via ORAL

## 2015-08-08 MED ORDER — SODIUM CHLORIDE 0.9 % IV SOLN
INTRAVENOUS | Status: DC | PRN
  Administered 2015-08-08: 60 mL

## 2015-08-08 MED ORDER — DIPHENHYDRAMINE HCL 12.5 MG/5ML PO ELIX
12.5000 mg | ORAL_SOLUTION | ORAL | Status: DC | PRN
  Administered 2015-08-08: 12.5 mg via ORAL
  Administered 2015-08-09: 25 mg via ORAL
  Administered 2015-08-09: 12.5 mg via ORAL
  Filled 2015-08-08 (×3): qty 10

## 2015-08-08 MED ORDER — ACETAMINOPHEN 325 MG PO TABS: 650.0000 mg | ORAL_TABLET | Freq: Four times a day (QID) | ORAL | Status: DC | PRN

## 2015-08-08 MED ORDER — ACETAMINOPHEN 10 MG/ML IV SOLN
INTRAVENOUS | Status: DC | PRN
  Administered 2015-08-08: 1000 mg via INTRAVENOUS

## 2015-08-08 MED ORDER — POLYETHYLENE GLYCOL 3350 17 GM/SCOOP PO POWD: 1.0000 | Freq: Once | ORAL | Status: DC

## 2015-08-08 MED ORDER — MAGNESIUM HYDROXIDE 400 MG/5ML PO SUSP
30.0000 mL | Freq: Every day | ORAL | Status: DC | PRN
  Administered 2015-08-09 – 2015-08-11 (×2): 30 mL via ORAL
  Filled 2015-08-08 (×2): qty 30

## 2015-08-08 MED ORDER — NEOMYCIN-POLYMYXIN B GU 40-200000 IR SOLN
Status: DC | PRN
  Administered 2015-08-08: 14 mL

## 2015-08-08 MED ORDER — FENTANYL CITRATE (PF) 100 MCG/2ML IJ SOLN: 25.0000 ug | INTRAMUSCULAR | Status: DC | PRN

## 2015-08-08 MED ORDER — METOCLOPRAMIDE HCL 10 MG PO TABS
10.0000 mg | ORAL_TABLET | Freq: Three times a day (TID) | ORAL | Status: AC
  Administered 2015-08-08 – 2015-08-10 (×7): 10 mg via ORAL
  Filled 2015-08-08 (×9): qty 1

## 2015-08-08 MED ORDER — ADULT MULTIVITAMIN W/MINERALS CH
1.0000 | ORAL_TABLET | Freq: Every day | ORAL | Status: DC
  Administered 2015-08-08 – 2015-08-11 (×4): 1 via ORAL
  Filled 2015-08-08 (×4): qty 1

## 2015-08-08 MED ORDER — MENTHOL 3 MG MT LOZG
1.0000 | LOZENGE | OROMUCOSAL | Status: DC | PRN
  Filled 2015-08-08: qty 9

## 2015-08-08 MED ORDER — PANTOPRAZOLE SODIUM 40 MG PO TBEC
40.0000 mg | DELAYED_RELEASE_TABLET | Freq: Two times a day (BID) | ORAL | Status: DC
  Administered 2015-08-08 – 2015-08-11 (×7): 40 mg via ORAL
  Filled 2015-08-08 (×7): qty 1

## 2015-08-08 MED ORDER — LACTATED RINGERS IV SOLN: INTRAVENOUS | Status: DC

## 2015-08-08 MED ORDER — FAMOTIDINE 20 MG PO TABS
ORAL_TABLET | ORAL | Status: AC
  Administered 2015-08-08: 20 mg via ORAL
  Filled 2015-08-08: qty 1

## 2015-08-08 MED ORDER — OMEGA-3-ACID ETHYL ESTERS 1 G PO CAPS
1.0000 g | ORAL_CAPSULE | Freq: Every day | ORAL | Status: DC
  Administered 2015-08-09 – 2015-08-11 (×3): 1 g via ORAL
  Filled 2015-08-08 (×3): qty 1

## 2015-08-08 MED ORDER — EPHEDRINE SULFATE 50 MG/ML IJ SOLN
INTRAMUSCULAR | Status: DC | PRN
  Administered 2015-08-08: 5 mg via INTRAVENOUS

## 2015-08-08 MED ORDER — TEMAZEPAM 15 MG PO CAPS
15.0000 mg | ORAL_CAPSULE | Freq: Every evening | ORAL | Status: DC | PRN
  Administered 2015-08-08: 15 mg via ORAL
  Filled 2015-08-08: qty 1

## 2015-08-08 MED ORDER — ONDANSETRON HCL 4 MG PO TABS: 4.0000 mg | ORAL_TABLET | Freq: Four times a day (QID) | ORAL | Status: DC | PRN

## 2015-08-08 MED ORDER — ALUM & MAG HYDROXIDE-SIMETH 200-200-20 MG/5ML PO SUSP: 30.0000 mL | ORAL | Status: DC | PRN

## 2015-08-08 MED ORDER — NEOMYCIN-POLYMYXIN B GU 40-200000 IR SOLN
Status: AC
  Filled 2015-08-08: qty 20

## 2015-08-08 MED ORDER — ONDANSETRON HCL 4 MG/2ML IJ SOLN: 4.0000 mg | Freq: Four times a day (QID) | INTRAMUSCULAR | Status: DC | PRN

## 2015-08-08 MED ORDER — BUPIVACAINE-EPINEPHRINE (PF) 0.25% -1:200000 IJ SOLN
INTRAMUSCULAR | Status: AC
  Filled 2015-08-08: qty 30

## 2015-08-08 MED ORDER — BISACODYL 10 MG RE SUPP: 10.0000 mg | Freq: Every day | RECTAL | Status: DC | PRN

## 2015-08-08 MED ORDER — BUPIVACAINE HCL (PF) 0.5 % IJ SOLN
INTRAMUSCULAR | Status: DC | PRN
  Administered 2015-08-08: 10 mg

## 2015-08-08 MED ORDER — OXYCODONE HCL 5 MG PO TABS
5.0000 mg | ORAL_TABLET | ORAL | Status: DC | PRN
  Administered 2015-08-08 – 2015-08-11 (×6): 10 mg via ORAL
  Filled 2015-08-08 (×7): qty 2

## 2015-08-08 MED ORDER — OXYCODONE HCL 5 MG PO TABS
5.0000 mg | ORAL_TABLET | ORAL | Status: DC | PRN
  Administered 2015-08-08: 5 mg via ORAL
  Filled 2015-08-08: qty 1

## 2015-08-08 SURGICAL SUPPLY — 59 items
AUTOTRANSFUS HAS 1/8 (MISCELLANEOUS) ×3
BATTERY INSTRU NAVIGATION (MISCELLANEOUS) ×12 IMPLANT
BLADE SAW 1 (BLADE) ×3 IMPLANT
BLADE SAW 1/2 (BLADE) ×3 IMPLANT
BTRY SRG DRVR LF (MISCELLANEOUS) ×4
CANISTER SUCT 1200ML W/VALVE (MISCELLANEOUS) ×3 IMPLANT
CANISTER SUCT 3000ML (MISCELLANEOUS) ×6 IMPLANT
CAPT KNEE TOTAL 3 ATTUNE ×2 IMPLANT
CATH TRAY METER 16FR LF (MISCELLANEOUS) ×3 IMPLANT
CEMENT HV SMART SET (Cement) ×6 IMPLANT
COOLER POLAR GLACIER W/PUMP (MISCELLANEOUS) ×3 IMPLANT
DRAPE SHEET LG 3/4 BI-LAMINATE (DRAPES) ×3 IMPLANT
DRSG DERMACEA 8X12 NADH (GAUZE/BANDAGES/DRESSINGS) ×3 IMPLANT
DRSG OPSITE POSTOP 4X14 (GAUZE/BANDAGES/DRESSINGS) ×3 IMPLANT
DRSG TEGADERM 4X4.75 (GAUZE/BANDAGES/DRESSINGS) ×3 IMPLANT
DURAPREP 26ML APPLICATOR (WOUND CARE) ×6 IMPLANT
ELECT CAUTERY BLADE 6.4 (BLADE) ×3 IMPLANT
ELECT REM PT RETURN 9FT ADLT (ELECTROSURGICAL) ×3
ELECTRODE REM PT RTRN 9FT ADLT (ELECTROSURGICAL) ×1 IMPLANT
EX-PIN ORTHOLOCK NAV 4X150 (PIN) ×6 IMPLANT
GLOVE BIOGEL M STRL SZ7.5 (GLOVE) ×6 IMPLANT
GLOVE INDICATOR 8.0 STRL GRN (GLOVE) ×3 IMPLANT
GLOVE SURG 9.0 ORTHO LTXF (GLOVE) ×3 IMPLANT
GLOVE SURG ORTHO 9.0 STRL STRW (GLOVE) ×3 IMPLANT
GOWN STRL REUS W/ TWL LRG LVL3 (GOWN DISPOSABLE) ×2 IMPLANT
GOWN STRL REUS W/TWL 2XL LVL3 (GOWN DISPOSABLE) ×3 IMPLANT
GOWN STRL REUS W/TWL LRG LVL3 (GOWN DISPOSABLE) ×6
HANDPIECE SUCTION TUBG SURGILV (MISCELLANEOUS) ×3 IMPLANT
HOLDER FOLEY CATH W/STRAP (MISCELLANEOUS) ×3 IMPLANT
HOOD PEEL AWAY FLYTE STAYCOOL (MISCELLANEOUS) ×6 IMPLANT
KIT RM TURNOVER STRD PROC AR (KITS) ×3 IMPLANT
KNIFE SCULPS 14X20 (INSTRUMENTS) ×3 IMPLANT
NDL SAFETY 18GX1.5 (NEEDLE) ×3 IMPLANT
NDL SPNL 20GX3.5 QUINCKE YW (NEEDLE) ×1 IMPLANT
NEEDLE SPNL 20GX3.5 QUINCKE YW (NEEDLE) ×3 IMPLANT
NS IRRIG 500ML POUR BTL (IV SOLUTION) ×3 IMPLANT
PACK TOTAL KNEE (MISCELLANEOUS) ×3 IMPLANT
PAD WRAPON POLAR KNEE (MISCELLANEOUS) ×1 IMPLANT
PIN DRILL QUICK PACK ×3 IMPLANT
PIN FIXATION 1/8DIA X 3INL (PIN) ×3 IMPLANT
SOL .9 NS 3000ML IRR  AL (IV SOLUTION) ×2
SOL .9 NS 3000ML IRR AL (IV SOLUTION) ×1
SOL .9 NS 3000ML IRR UROMATIC (IV SOLUTION) ×1 IMPLANT
SOL PREP PVP 2OZ (MISCELLANEOUS) ×3
SOLUTION PREP PVP 2OZ (MISCELLANEOUS) ×1 IMPLANT
SPONGE DRAIN TRACH 4X4 STRL 2S (GAUZE/BANDAGES/DRESSINGS) ×3 IMPLANT
STAPLER SKIN PROX 35W (STAPLE) ×3 IMPLANT
SUCTION FRAZIER HANDLE 10FR (MISCELLANEOUS) ×2
SUCTION TUBE FRAZIER 10FR DISP (MISCELLANEOUS) ×1 IMPLANT
SUT VIC AB 0 CT1 36 (SUTURE) ×3 IMPLANT
SUT VIC AB 1 CT1 36 (SUTURE) ×6 IMPLANT
SUT VIC AB 2-0 CT2 27 (SUTURE) ×3 IMPLANT
SYR 20CC LL (SYRINGE) ×3 IMPLANT
SYR 30ML LL (SYRINGE) ×3 IMPLANT
SYR 50ML LL SCALE MARK (SYRINGE) ×3 IMPLANT
SYSTEM AUTOTRANSFUS DUAL TROCR (MISCELLANEOUS) ×1 IMPLANT
TOWEL OR 17X26 4PK STRL BLUE (TOWEL DISPOSABLE) ×3 IMPLANT
TOWER CARTRIDGE SMART MIX (DISPOSABLE) ×3 IMPLANT
WRAPON POLAR PAD KNEE (MISCELLANEOUS) ×3

## 2015-08-08 NOTE — Anesthesia Postprocedure Evaluation (Signed)
Anesthesia Post Note  Patient: Kerry Cabrera  Procedure(s) Performed: Procedure(s) (LRB): COMPUTER ASSISTED TOTAL KNEE ARTHROPLASTY (Left)  Patient location during evaluation: PACU Anesthesia Type: General Level of consciousness: awake Pain management: pain level controlled Vital Signs Assessment: post-procedure vital signs reviewed and stable Respiratory status: spontaneous breathing Cardiovascular status: blood pressure returned to baseline Anesthetic complications: no    Last Vitals:  Filed Vitals:   08/08/15 1442 08/08/15 1500  BP: 105/55 102/47  Pulse: 67 69  Temp: 36.3 C 36.4 C  Resp: 18 18    Last Pain:  Filed Vitals:   08/08/15 1500  PainSc: 4                  VAN STAVEREN,[name]

## 2015-08-08 NOTE — Progress Notes (Signed)
Pt is insisting that she should be a DNR.  She does not want any resuscitation measures done.  She does have her living will and HCPOA papers.  DNR orders given

## 2015-08-08 NOTE — Anesthesia Preprocedure Evaluation (Signed)
Anesthesia Evaluation  Patient identified by MRN, date of birth, ID band Patient awake    Reviewed: Allergy & Precautions, NPO status , Patient's Chart, lab work & pertinent test results  Airway Mallampati: III       Dental  (+) Teeth Intact   Pulmonary neg pulmonary ROS, former smoker,    breath sounds clear to auscultation       Cardiovascular hypertension, + CAD and + Peripheral Vascular Disease   Rhythm:Regular     Neuro/Psych Depression TIA   GI/Hepatic Neg liver ROS, PUD,   Endo/Other  Hypothyroidism   Renal/GU      Musculoskeletal negative musculoskeletal ROS (+)   Abdominal   Peds  Hematology negative hematology ROS (+)   Anesthesia Other Findings   Reproductive/Obstetrics                             Anesthesia Physical Anesthesia Plan  ASA: III  Anesthesia Plan: Spinal   Post-op Pain Management:    Induction: Intravenous  Airway Management Planned: Natural Airway  Additional Equipment:   Intra-op Plan:   Post-operative Plan:   Informed Consent: I have reviewed the patients History and Physical, chart, labs and discussed the procedure including the risks, benefits and alternatives for the proposed anesthesia with the patient or authorized representative who has indicated his/her understanding and acceptance.     Plan Discussed with: CRNA  Anesthesia Plan Comments:         Anesthesia Quick Evaluation

## 2015-08-08 NOTE — H&P (Signed)
The patient has been re-examined, and the chart reviewed, and there have been no interval changes to the documented history and physical.    The risks, benefits, and alternatives have been discussed at length. The patient expressed understanding of the risks benefits and agreed with plans for surgical intervention.  James P. Hooten, Jr. M.D.    

## 2015-08-08 NOTE — Transfer of Care (Signed)
Immediate Anesthesia Transfer of Care Note  Patient: Kerry Cabrera  Procedure(s) Performed: Procedure(s): COMPUTER ASSISTED TOTAL KNEE ARTHROPLASTY (Left)  Patient Location: PACU  Anesthesia Type:Spinal  Level of Consciousness: awake, alert  and oriented  Airway & Oxygen Therapy: Patient Spontanous Breathing and Patient connected to face mask oxygen  Post-op Assessment: Report given to RN and Post -op Vital signs reviewed and stable  Post vital signs: Reviewed and stable  Last Vitals:  Filed Vitals:   08/08/15 0945  BP: 109/70  Pulse: 120  Temp: 36.7 C  Resp: 16    Last Pain:  Filed Vitals:   08/08/15 0947  PainSc: 4          Complications: No apparent anesthesia complications

## 2015-08-08 NOTE — Anesthesia Procedure Notes (Addendum)
Performed by: Demetrius Charity Pre-anesthesia Checklist: Patient identified, Emergency Drugs available, Suction available, Patient being monitored and Timeout performed Patient Re-evaluated:Patient Re-evaluated prior to inductionOxygen Delivery Method: Simple face mask Intubation Type: IV induction   Spinal Patient location during procedure: OR Start time: 08/08/2015 10:39 AM End time: 08/08/2015 10:48 AM Staffing Anesthesiologist: Marline Backbone F Resident/CRNA: Demetrius Charity Performed by: resident/CRNA  Preanesthetic Checklist Completed: patient identified, site marked, surgical consent, pre-op evaluation, timeout performed, IV checked, risks and benefits discussed and monitors and equipment checked Spinal Block Patient position: sitting Prep: Betadine Patient monitoring: heart rate, continuous pulse ox, blood pressure and cardiac monitor Approach: midline Location: L3-4 Injection technique: single-shot Needle Needle type: Whitacre and Introducer  Needle gauge: 25 G Needle length: 9 cm Assessment Sensory level: T10 Additional Notes Negative paresthesia. Negative blood return. Positive free-flowing CSF. Expiration date of kit checked and confirmed. Patient tolerated procedure well, without complications.

## 2015-08-08 NOTE — Care Management (Signed)
Patient received from PACU and PT pending .RNCM will continue to follow along with CSW.

## 2015-08-08 NOTE — Op Note (Signed)
OPERATIVE NOTE  DATE OF SURGERY:  08/08/2015  PATIENT NAME:  Kerry Cabrera   DOB: 1928-12-29  MRN: [id number]  PRE-OPERATIVE DIAGNOSIS: Degenerative arthrosis of the left knee, primary  POST-OPERATIVE DIAGNOSIS:  Same  PROCEDURE:  Left total knee arthroplasty using computer-assisted navigation  SURGEON:  Jena Gauss. M.D.  ASSISTANT:  Van Clines, PA (present and scrubbed throughout the case, critical for assistance with exposure, retraction, instrumentation, and closure)  ANESTHESIA: spinal  ESTIMATED BLOOD LOSS: 50 mL  FLUIDS REPLACED: 1600 mL of crystalloid  TOURNIQUET TIME: 97 minutes  DRAINS: 2 medium drains to a reinfusion system  SOFT TISSUE RELEASES: Anterior cruciate ligament, posterior cruciate ligament, deep medial collateral ligament, patellofemoral ligament, and posterolateral corner  IMPLANTS UTILIZED: DePuy Attune size 7 posterior stabilized femoral component (cemented), size 6 rotating platform tibial component (cemented), 38 mm medialized dome patella (cemented), and a 7 mm stabilized rotating platform polyethylene insert.  INDICATIONS FOR SURGERY: Kerry Cabrera is a 82 y.o. year old female with a long history of progressive knee pain. X-rays demonstrated severe degenerative changes in tricompartmental fashion. The patient had not seen any significant improvement despite conservative nonsurgical intervention. After discussion of the risks and benefits of surgical intervention, the patient expressed understanding of the risks benefits and agree with plans for total knee arthroplasty.   The risks, benefits, and alternatives were discussed at length including but not limited to the risks of infection, bleeding, nerve injury, stiffness, blood clots, the need for revision surgery, cardiopulmonary complications, among others, and they were willing to proceed.  PROCEDURE IN DETAIL: The patient was brought into the operating room and, after adequate spinal  anesthesia was achieved, a tourniquet was placed on the patient's upper thigh. The patient's knee and leg were cleaned and prepped with alcohol and DuraPrep and draped in the usual sterile fashion. A "timeout" was performed as per usual protocol. The lower extremity was exsanguinated using an Esmarch, and the tourniquet was inflated to 300 mmHg. An anterior longitudinal incision was made followed by a standard mid vastus approach. The deep fibers of the medial collateral ligament were elevated in a subperiosteal fashion off of the medial flare of the tibia so as to maintain a continuous soft tissue sleeve. The patella was subluxed laterally and the patellofemoral ligament was incised. Inspection of the knee demonstrated severe degenerative changes with full-thickness loss of articular cartilage. Osteophytes were debrided using a rongeur. Anterior and posterior cruciate ligaments were excised. Two 4.0 mm Schanz pins were inserted in the femur and into the tibia for attachment of the array of trackers used for computer-assisted navigation. Hip center was identified using a circumduction technique. Distal landmarks were mapped using the computer. The distal femur and proximal tibia were mapped using the computer. The distal femoral cutting guide was positioned using computer-assisted navigation so as to achieve a 5 distal valgus cut. The femur was sized and it was felt that a size 7 femoral component was appropriate. A size 7 femoral cutting guide was positioned and the anterior cut was performed and verified using the computer. This was followed by completion of the posterior and chamfer cuts. Femoral cutting guide for the central box was then positioned in the center box cut was performed.  Attention was then directed to the proximal tibia. Medial and lateral menisci were excised. The extramedullary tibial cutting guide was positioned using computer-assisted navigation so as to achieve a 0 varus-valgus alignment  and 3 posterior slope. The cut was performed and verified  using the computer. The proximal tibia was sized and it was felt that a size 6 tibial tray was appropriate. Tibial and femoral trials were inserted followed by insertion of a 5 mm polyethylene insert. The knee was felt to be tight laterally. The trial components were removed and the knee brought into extension and distracted using the Moreland retractors. The posterolateral corner was carefully released using combination of electrocautery and Metzenbaum scissors. The trial components were replaced followed by insertion of first a 6 and subsequently a 7 mm polyethylene trial. This allowed for excellent mediolateral soft tissue balancing both in flexion and in full extension. Finally, the patella was cut and prepared so as to accommodate a 38 mm medialized dome patella. A patella trial was placed and the knee was placed through a range of motion with excellent patellar tracking appreciated. The femoral trial was removed after debridement of posterior osteophytes. The central post-hole for the tibial component was reamed followed by insertion of a keel punch. Tibial trials were then removed. Cut surfaces of bone were irrigated with copious amounts of normal saline with antibiotic solution using pulsatile lavage and then suctioned dry. Polymethylmethacrylate cement was prepared in the usual fashion using a vacuum mixer. Cement was applied to the cut surface of the proximal tibia as well as along the undersurface of a size 6 rotating platform tibial component. Tibial component was positioned and impacted into place. Excess cement was removed using Personal assistantreer elevators. Cement was then applied to the cut surfaces of the femur as well as along the posterior flanges of the size 7 femoral component. The femoral component was positioned and impacted into place. Excess cement was removed using Personal assistantreer elevators. A 7 mm polyethylene trial was inserted and the knee was brought  into full extension with steady axial compression applied. Finally, cement was applied to the backside of a 38 mm medialized dome patella and the patellar component was positioned and patellar clamp applied. Excess cement was removed using Personal assistantreer elevators. After adequate curing of the cement, the tourniquet was deflated after a total tourniquet time of 97 minutes. Hemostasis was achieved using electrocautery. The knee was irrigated with copious amounts of normal saline with antibiotic solution using pulsatile lavage and then suctioned dry. 20 mL of 1.3% Exparel in 40 mL of normal saline was injected along the posterior capsule, medial and lateral gutters, and along the arthrotomy site. A 7 mm stabilized rotating platform polyethylene insert was inserted and the knee was placed through a range of motion with excellent mediolateral soft tissue balancing appreciated and excellent patellar tracking noted. 2 medium drains were placed in the wound bed and brought out through separate stab incisions to be attached to a reinfusion system. The medial parapatellar portion of the incision was reapproximated using interrupted sutures of #1 Vicryl. Subcutaneous tissue was then injected with a total of 30 cc of 0.25% Marcaine with epinephrine. Subcutaneous tissue was approximated in layers using first #0 Vicryl followed #2-0 Vicryl. The skin was approximated with skin staples. A sterile dressing was applied.  The patient tolerated the procedure well and was transported to the recovery room in stable condition.    James P. Angie FavaHooten, Jr., M.D.

## 2015-08-08 NOTE — NC FL2 (Signed)
[place] MEDICAID FL2 LEVEL OF CARE SCREENING TOOL     IDENTIFICATION  Patient Name: Kerry Cabrera Birthdate: 02/21/1929 Sex: female Admission Date (Current Location): 08/08/2015  [place]   Facility and Address:  [facility], [address], [place], [place] [address]      Provider Number: [id number]  Attending Physician Name and Address:  Donato HeinzJames P Janisa Labus, MD  Relative Name and Phone Number:       Current Level of Care: Hospital Recommended Level of Care: Skilled Nursing Facility Prior Approval Number:    Date Approved/Denied:   PASRR Number:  ( [id number] )  Discharge Plan: SNF    Current Diagnoses: Patient Active Problem List   Diagnosis Date Noted  . S/P total knee arthroplasty 08/08/2015   Primary osteoarthritis of left knee 03/21/2015  Moderate mitral insufficiency 12/20/2014  Lumbar radiculitis 10/27/2014  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  Bilateral carotid artery stenosis 06/29/2014  B12 deficiency 03/02/2014  Hypothyroid, unspecified 09/02/2013  Hyperlipidemia, mixed 07/09/2013  Iron deficiency anemia, unspecified   CAD (coronary artery disease)        Orientation RESPIRATION BLADDER Height & Weight     Self, Time, Situation, Place  Normal Continent Weight: 165 lb (74.844 kg) Height:  5\' 7"  (170.2 cm)  BEHAVIORAL SYMPTOMS/MOOD NEUROLOGICAL BOWEL NUTRITION STATUS   (none )  (none ) Continent Diet (Diet: Clear Liquid )  AMBULATORY STATUS COMMUNICATION OF NEEDS Skin   Extensive Assist Verbally Surgical wounds (Left Knee Incision )                       Personal Care Assistance Level of Assistance  Bathing, Feeding, Dressing Bathing Assistance: Limited assistance Feeding assistance: Independent Dressing Assistance: Limited assistance     Functional Limitations Info  Sight, Hearing, Speech Sight Info: Adequate Hearing Info: Adequate Speech Info: Adequate    SPECIAL CARE FACTORS  FREQUENCY  PT (By licensed PT), OT (By licensed OT)     PT Frequency:  (5) OT Frequency:  (5)            Contractures      Additional Factors Info  Code Status, Allergies Code Status Info:  (Not on file ) Allergies Info:  (Ace Inhibitors, Codeine, Gabapentin, Micardis Hct, Prednisone, Sulfa Antibiotics, Zithromax)           Current Medications (08/08/2015):  This is the current hospital active medication list Current Facility-Administered Medications  Medication Dose Route Frequency Provider Last Rate Last Dose  . 0.9 %  sodium chloride infusion   Intravenous Continuous Donato HeinzJames P Leston Schueller, MD      . acetaminophen (OFIRMEV) IV 1,000 mg  1,000 mg Intravenous Q6H Donato HeinzJames P Nyasiah Moffet, MD      . acetaminophen (TYLENOL) tablet 650 mg  650 mg Oral Q6H PRN Donato HeinzJames P Dyrell Tuccillo, MD       Or  . acetaminophen (TYLENOL) suppository 650 mg  650 mg Rectal Q6H PRN Donato HeinzJames P Rexanne Inocencio, MD      . alum & mag hydroxide-simeth (MAALOX/MYLANTA) 200-200-20 MG/5ML suspension 30 mL  30 mL Oral Q4H PRN Donato HeinzJames P Taelon Bendorf, MD      . bisacodyl (DULCOLAX) suppository 10 mg  10 mg Rectal Daily PRN Donato HeinzJames P Aza Dantes, MD      . buPROPion (WELLBUTRIN XL) 24 hr tablet 150 mg  150 mg Oral Daily Donato HeinzJames P Alazar Cherian, MD      . ceFAZolin (ANCEF) IVPB 2g/100 mL premix  2 g Intravenous Q6H  Donato Heinz, MD      . diphenhydrAMINE (BENADRYL) 12.5 MG/5ML elixir 12.5-25 mg  12.5-25 mg Oral Q4H PRN Donato Heinz, MD      . Melene Muller ON 08/09/2015] enoxaparin (LOVENOX) injection 30 mg  30 mg Subcutaneous Q12H Donato Heinz, MD      . ferrous sulfate tablet 325 mg  325 mg Oral BID WC Donato Heinz, MD      . furosemide (LASIX) tablet 20 mg  20 mg Oral Daily PRN Donato Heinz, MD      . Melene Muller ON 08/09/2015] levothyroxine (SYNTHROID, LEVOTHROID) tablet 112 mcg  112 mcg Oral QAC breakfast Donato Heinz, MD      . magnesium hydroxide (MILK OF MAGNESIA) suspension 30 mL  30 mL Oral Daily PRN Donato Heinz, MD      . menthol-cetylpyridinium (CEPACOL) lozenge  3 mg  1 lozenge Oral PRN Donato Heinz, MD       Or  . phenol (CHLORASEPTIC) mouth spray 1 spray  1 spray Mouth/Throat PRN Donato Heinz, MD      . metoCLOPramide (REGLAN) tablet 10 mg  10 mg Oral TID AC & HS Donato Heinz, MD      . morphine 2 MG/ML injection 2 mg  2 mg Intravenous Q2H PRN Donato Heinz, MD      . multivitamin with minerals tablet 1 tablet  1 tablet Oral Daily Donato Heinz, MD      . omega-3 acid ethyl esters (LOVAZA) capsule 1 g  1 g Oral Daily Donato Heinz, MD      . ondansetron (ZOFRAN) tablet 4 mg  4 mg Oral Q6H PRN Donato Heinz, MD       Or  . ondansetron (ZOFRAN) injection 4 mg  4 mg Intravenous Q6H PRN Donato Heinz, MD      . oxyCODONE (Oxy IR/ROXICODONE) immediate release tablet 5-10 mg  5-10 mg Oral Q4H PRN Donato Heinz, MD      . pantoprazole (PROTONIX) EC tablet 40 mg  40 mg Oral BID Donato Heinz, MD      . senna-docusate (Senokot-S) tablet 1 tablet  1 tablet Oral BID Donato Heinz, MD      . sodium phosphate (FLEET) 7-19 GM/118ML enema 1 enema  1 enema Rectal Once PRN Donato Heinz, MD      . temazepam (RESTORIL) capsule 15 mg  15 mg Oral QHS PRN Donato Heinz, MD      . traMADol Janean Sark) tablet 50-100 mg  50-100 mg Oral Q4H PRN Donato Heinz, MD      . vitamin B-12 (CYANOCOBALAMIN) tablet 1,000 mcg  1,000 mcg Oral Daily Donato Heinz, MD         Discharge Medications: Please see discharge summary for a list of discharge medications.  Relevant Imaging Results:  Relevant Lab Results:   Additional Information  (SSN: [id number])  Haig Prophet, LCSW

## 2015-08-08 NOTE — Brief Op Note (Signed)
08/08/2015  2:08 PM  PATIENT:  Kerry Cabrera  82 y.o. female  PRE-OPERATIVE DIAGNOSIS:  PRIMARY OSTEOARTHRITIS of the left knee  POST-OPERATIVE DIAGNOSIS:  Same  PROCEDURE:  Procedure(s): COMPUTER ASSISTED TOTAL KNEE ARTHROPLASTY (Left)  SURGEON:  Surgeon(s) and Role:    * Donato HeinzJames P Lizania Bouchard, MD - Primary  ASSISTANTS: Van ClinesJon Wolfe, PA   ANESTHESIA:   spinal  EBL:  Total I/O In: 1600 [I.V.:1600] Out: 250 [Urine:200; Blood:50]  BLOOD ADMINISTERED:none  DRAINS: 2 medium drains to a reinfusion system   LOCAL MEDICATIONS USED:  MARCAINE    and OTHER Exparel  SPECIMEN:  No Specimen  DISPOSITION OF SPECIMEN:  N/A  COUNTS:  YES  TOURNIQUET:   97 minutes  DICTATION: .Dragon Dictation  PLAN OF CARE: Admit to inpatient   PATIENT DISPOSITION:  PACU - hemodynamically stable.   Delay start of Pharmacological VTE agent (>24hrs) due to surgical blood loss or risk of bleeding: yes

## 2015-08-09 ENCOUNTER — Encounter: Payer: Self-pay | Admitting: Orthopedic Surgery

## 2015-08-09 LAB — CBC
HCT: 36.3 % (ref 35.0–47.0)
HEMOGLOBIN: 12.1 g/dL (ref 12.0–16.0)
MCH: 30.6 pg (ref 26.0–34.0)
MCHC: 33.2 g/dL (ref 32.0–36.0)
MCV: 92.2 fL (ref 80.0–100.0)
Platelets: 162 10*3/uL (ref 150–440)
RBC: 3.93 MIL/uL (ref 3.80–5.20)
RDW: 13.8 % (ref 11.5–14.5)
WBC: 7.3 10*3/uL (ref 3.6–11.0)

## 2015-08-09 LAB — BASIC METABOLIC PANEL
Anion gap: 7 (ref 5–15)
BUN: 21 mg/dL — AB (ref 6–20)
CALCIUM: 8.1 mg/dL — AB (ref 8.9–10.3)
CO2: 24 mmol/L (ref 22–32)
Chloride: 108 mmol/L (ref 101–111)
Creatinine, Ser: 0.93 mg/dL (ref 0.44–1.00)
GFR calc Af Amer: >60 mL/min (ref >60)
GFR, EST NON AFRICAN AMERICAN: 55 mL/min — AB (ref >60)
GLUCOSE: 112 mg/dL — AB (ref 65–99)
POTASSIUM: 4 mmol/L (ref 3.5–5.1)
SODIUM: 139 mmol/L (ref 135–145)

## 2015-08-09 MED ORDER — LORAZEPAM 1 MG PO TABS
1.0000 mg | ORAL_TABLET | Freq: Four times a day (QID) | ORAL | Status: DC | PRN
  Administered 2015-08-09: 2 mg via ORAL
  Administered 2015-08-09 (×2): 1 mg via ORAL
  Administered 2015-08-10: 2 mg via ORAL
  Filled 2015-08-09: qty 1
  Filled 2015-08-09 (×2): qty 2
  Filled 2015-08-09: qty 1

## 2015-08-09 NOTE — Care Management (Signed)
Per RN concern that patient may be somewhat confused this AM and has requested to rest. I stopped by to visit with patient and she was resting peacefully- feet twitching. I did not wake her. I dropped off list of home health agencies. RNCM to continue to follow. PT evaluation pending.

## 2015-08-09 NOTE — Progress Notes (Signed)
Physical Therapy Treatment Patient Details Name: Kerry Cabrera MRN: [id number] DOB: [birth year] Today's Date: 08/09/2015    History of Present Illness Pt is an 82 y.o.female s/p L TKA 08/08/15 d/t degenerative arthrosis.  Per notes, pt with pain and anxiety post-op.    PT Comments    Pt in chair upon arrival ready to get back to bed.  Primary nurse in to medicate pt.  Sit to stand with mod a x 1 with verbal cues for hand placement and safety.  Transferred chair to bed with min guard x 1.  Gait slow and hesitant due to pain but no loss of balance.  Decreased safety noted with hand placements.  PT assisted to supine where she tolerated minimal exercise. "Oh, that's enough"   Pt shaking throughout stating "I'm going to die"  Encouragement given and discussed expectations during recovery process.     Follow Up Recommendations  SNF     Equipment Recommendations   (pt already owns RW)    Recommendations for Other Services       Precautions / Restrictions Precautions Precautions: Fall Precaution Comments: L KI if not able to perform SLR independently Restrictions Weight Bearing Restrictions: Yes LLE Weight Bearing: Weight bearing as tolerated    Mobility  Bed Mobility Overal bed mobility: Needs Assistance Bed Mobility: Sit to Supine     Supine to sit: Mod assist     General bed mobility comments: asssit for le and to scoot up in bed for comfort  Transfers Overall transfer level: Needs assistance Equipment used: Rolling walker (2 wheeled) Transfers: Sit to/from Stand Sit to Stand: Mod assist         General transfer comment: Pt. up in chair upon arrival. Pt. seen at chair level.  Ambulation/Gait Ambulation/Gait assistance: Min assist Ambulation Distance (Feet): 3 Feet Assistive device: Rolling walker (2 wheeled) Gait Pattern/deviations: Step-to pattern;Decreased stance time - right Gait velocity: decreased Gait velocity interpretation: <1.8 ft/sec, indicative of risk  for recurrent falls General Gait Details: antalgic; decreased stance time L LE; vc's required for gait technique and walker use   Stairs            Wheelchair Mobility    Modified Rankin (Stroke Patients Only)       Balance Overall balance assessment: Needs assistance Sitting-balance support: Feet supported Sitting balance-Leahy Scale: Fair Sitting balance - Comments: posterior lean initially upon sitting but with cueing able to sit with SBA   Standing balance support: Bilateral upper extremity supported Standing balance-Leahy Scale: Fair                      Cognition Arousal/Alertness: Awake/alert Behavior During Therapy: Anxious Overall Cognitive Status: Impaired/Different from baseline       Memory: Decreased recall of precautions              Exercises Total Joint Exercises Ankle Circles/Pumps: AROM;20 reps;Supine;Both Straight Leg Raises: AAROM;Left;Supine;5 reps Knee Flexion: AAROM;Left;5 reps;Seated Goniometric ROM: L knee extension 12 degrees short of neutral semi-supine in bed; L knee flexion 75 degrees in sitting    General Comments General comments (skin integrity, edema, etc.): L hemovac in place      Pertinent Vitals/Pain Pain Assessment: 0-10 Pain Score: 8  Pain Descriptors / Indicators: Aching Pain Intervention(s): Limited activity within patient's tolerance;RN gave pain meds during session;Ice applied;Utilized relaxation techniques    Home Living Family/patient expects to be discharged to:: Private residence Living Arrangements: Alone   Type of Home: House Home Access:  Stairs to enter Entrance Stairs-Rails: None Home Layout: One level Home Equipment: Walker - 2 wheels;Bedside commode;Shower seat;Grab bars - toilet;Grab bars - tub/shower      Prior Function Level of Independence: Independent with assistive device(s)          PT Goals (current goals can now be found in the care plan section) Acute Rehab PT  Goals Patient Stated Goal: To feel better, and regain independence PT Goal Formulation: With patient Time For Goal Achievement: 08/23/15 Potential to Achieve Goals: Good Additional Goals Additional Goal #1: Pt's L knee ROM at least 0-90 degrees.    Frequency  BID    PT Plan Current plan remains appropriate    Co-evaluation             End of Session Equipment Utilized During Treatment: Gait belt Activity Tolerance: Patient limited by pain Patient left: in bed;with call bell/phone within reach;with bed alarm set     Time: 1320-1349 PT Time Calculation (min) (ACUTE ONLY): 29 min  Charges:  $Gait Training: 8-22 mins $Therapeutic Exercise: 8-22 mins                    G Codes:      Danielle DessSarah Montrice Montuori, PTA 08/09/2015, 3:19 PM

## 2015-08-09 NOTE — Evaluation (Signed)
Physical Therapy Evaluation Patient Details Name: Kerry Cabrera MRN: [id number] DOB: 04/18/1928 Today's Date: 08/09/2015   History of Present Illness  Pt is an 82 y.o.female s/p L TKA 08/08/15 d/t degenerative arthrosis.  Per notes, pt with pain and anxiety post-op.  Clinical Impression  Prior to admission, per pt report, pt was modified independent ambulating with RW.  Pt lives alone in 1 level home with step to enter.  Currently pt appears confused during session (nursing aware) and required mod assist supine to sit, mod assist to stand, and min assist to ambulate a few feet bed to chair with RW (limited distance d/t c/o L knee pain and also increased anxiety noted).  Pt would benefit from skilled PT to address noted impairments and functional limitations.  Recommend pt discharge to STR when medically appropriate.     Follow Up Recommendations SNF    Equipment Recommendations   (pt already owns RW)    Recommendations for Other Services       Precautions / Restrictions Precautions Precautions: Fall Precaution Comments: L KI if not able to perform SLR independently Restrictions Weight Bearing Restrictions: Yes LLE Weight Bearing: Weight bearing as tolerated      Mobility  Bed Mobility Overal bed mobility: Needs Assistance Bed Mobility: Supine to Sit     Supine to sit: Mod assist     General bed mobility comments: assist for L>R LE and assist to scoot forward to edge of bed; vc's required for hand placement and how to assist  Transfers Overall transfer level: Needs assistance Equipment used: Rolling walker (2 wheeled) Transfers: Sit to/from Stand Sit to Stand: Mod assist         General transfer comment: vc's required for hand and feet placement; increased time to perform and multiple attempts to stand  Ambulation/Gait Ambulation/Gait assistance: Min assist Ambulation Distance (Feet): 3 Feet (bed to recliner) Assistive device: Rolling walker (2 wheeled)   Gait  velocity: decreased   General Gait Details: antalgic; decreased stance time L LE; vc's required for gait technique and walker use  Stairs            Wheelchair Mobility    Modified Rankin (Stroke Patients Only)       Balance Overall balance assessment: Needs assistance Sitting-balance support: Bilateral upper extremity supported;Feet supported Sitting balance-Leahy Scale: Fair Sitting balance - Comments: posterior lean initially upon sitting but with cueing able to sit with SBA   Standing balance support: Bilateral upper extremity supported (on RW) Standing balance-Leahy Scale: Fair                               Pertinent Vitals/Pain Pain Assessment: 0-10 Pain Score: 6  Pain Location: L knee Pain Descriptors / Indicators: Aching;Sore;Tender;Operative site guarding Pain Intervention(s): Limited activity within patient's tolerance;Monitored during session;Premedicated before session;Repositioned;Ice applied    Home Living Family/patient expects to be discharged to:: Private residence Living Arrangements: Alone   Type of Home: House Home Access: Stairs to enter Entrance Stairs-Rails: None Entrance Stairs-Number of Steps: 1 Home Layout: One level Home Equipment: Environmental consultant - 2 wheels;Bedside commode;Shower seat;Grab bars - toilet;Grab bars - tub/shower      Prior Function Level of Independence: Independent with assistive device(s)         Comments: Pt reports being independent with RW prior to admission and denies any falls in past 6 months.     Hand Dominance  Extremity/Trunk Assessment   Upper Extremity Assessment: Generalized weakness           Lower Extremity Assessment: RLE deficits/detail;LLE deficits/detail RLE Deficits / Details: R LE strength and ROM WFL LLE Deficits / Details: L hip flexion, knee flexion/extension, and DF at least 4/5; pt able to perform x10 SLR independently after significant cueing for technique  Cervical  / Trunk Assessment: Normal  Communication   Communication: No difficulties  Cognition Arousal/Alertness: Awake/alert Behavior During Therapy: Anxious Overall Cognitive Status: No family/caregiver present to determine baseline cognitive functioning (Pt oriented to person and time and situation but occasionally forgetting she was not at home during session)       Memory: Decreased recall of precautions              General Comments General comments (skin integrity, edema, etc.): L hemovac in place  Pt agreeable to PT session.    Exercises Total Joint Exercises Goniometric ROM: L knee extension 12 degrees short of neutral semi-supine in bed; L knee flexion 75 degrees in sitting Performed semi-supine B LE therapeutic exercise x 10 reps:  Ankle pumps (AROM B LE's); quad sets x3 second holds (AROM B LE's); SAQ's (AROM R; AAROM L); heelslides (AROM R; AAROM L), hip abd/adduction (AROM R; AAROM L), and SLR (AA to AROM L; AROM R).  Pt required vc's and tactile cues for correct technique with exercises.      Assessment/Plan    PT Assessment Patient needs continued PT services  PT Diagnosis Difficulty walking;Acute pain   PT Problem List Decreased strength;Decreased range of motion;Decreased activity tolerance;Decreased balance;Decreased mobility;Decreased cognition;Decreased knowledge of use of DME;Decreased safety awareness;Decreased knowledge of precautions;Pain  PT Treatment Interventions DME instruction;Gait training;Stair training;Functional mobility training;Therapeutic activities;Therapeutic exercise;Balance training;Patient/family education   PT Goals (Current goals can be found in the Care Plan section) Acute Rehab PT Goals Patient Stated Goal: to have less pain PT Goal Formulation: With patient Time For Goal Achievement: 08/23/15 Potential to Achieve Goals: Good    Frequency BID   Barriers to discharge Decreased caregiver support      Co-evaluation                End of Session Equipment Utilized During Treatment: Gait belt Activity Tolerance: Patient limited by pain Patient left: in chair;with call bell/phone within reach;with chair alarm set;with SCD's reapplied (B heels elevated via towel rolls; polar care in place and activated) Nurse Communication: Mobility status;Precautions;Weight bearing status         Time: 1030-1107 PT Time Calculation (min) (ACUTE ONLY): 37 min   Charges:   PT Evaluation $PT Eval Low Complexity: 1 Procedure PT Treatments $Therapeutic Exercise: 8-22 mins   PT G CodesHendricks Limes:        Issac Moure 08/09/2015, 11:21 AM Hendricks LimesEmily Ismahan Lippman, PT [phone]

## 2015-08-09 NOTE — Clinical Social Work Note (Signed)
Clinical Social Work Assessment  Patient Details  Name: Kerry Cabrera MRN: [id number] Date of Birth: [birth year]  Date of referral:  08/09/15               Reason for consult:  Facility Placement                Permission sought to share information with:  Family Supports Permission granted to share information::  Yes, Verbal Permission Granted  Name::     Collier Bullock, daughter   Housing/Transportation Living arrangements for the past 2 months:  [place] of Information:  Patient, Adult Children Patient Interpreter Needed:  None Criminal Activity/Legal Involvement Pertinent to Current Situation/Hospitalization:  No - Comment as needed Significant Relationships:  Adult Children Lives with:  Self Do you feel safe going back to the place where you live?  Yes Need for family participation in patient care:  Yes (Comment)  Care giving concerns:  No care giving concerns identified.   Social Worker assessment / plan:  CSW met with pt to address consult. CSW introduced herself and explained role of social work. CSW also explained the process of discharging to SNF. Pt shared that she was set up with [facility]. A referral was sent to [facility] and bed offer was made, which was accepted. CSW updated pt's daughter and explained process. CSW also confirmed with facility.   Employment status:  Retired Forensic scientist:  Medicare PT Recommendations:  [place] / Referral to community resources:  [place]  Patient/Family's Response to care:  Pt's daughter was Patent attorney of CSW.   Patient/Family's Understanding of and Emotional Response to Diagnosis, Current Treatment, and Prognosis:  Pt would like to go to SNF at discharge.   Emotional Assessment Appearance:  Appears stated age Attitude/Demeanor/Rapport:  Other (Apporpriate ) Affect (typically observed):  Pleasant Orientation:  Oriented to Self, Oriented to Place,  Oriented to  Time, Oriented to Situation Alcohol / Substance use:  Never Used Psych involvement (Current and /or in the community):  No (Comment)  Discharge Needs  Concerns to be addressed:  Adjustment to Illness Readmission within the last 30 days:  No Current discharge risk:  Chronically ill Barriers to Discharge:  Continued Medical Work up   Darden Dates, LCSW 08/09/2015, 4:33 PM

## 2015-08-09 NOTE — Progress Notes (Signed)
Patient crying in 10/10 pain, already received oxycodone at 1800, then morphine at 1900, Dr Joice LoftsPoggi, on-call, notified. Modified Morphine 2-4mg  Q2 PRN and oxycodone 5-10mg  Q3 PRN. Nursing providing support to patient and continues to monitor.

## 2015-08-09 NOTE — Progress Notes (Signed)
Pain under better control at this time, but patient experiencing panic and anxiety. Dr Joice LoftsPoggi, MD on-call notified, ordered lorazepam 1-2mg  PO Q6 PRN. Nursing providing support to patient and continues to monitor.

## 2015-08-09 NOTE — Evaluation (Signed)
Occupational Therapy Evaluation Patient Details Name: Kerry Cabrera MRN: [id number] DOB: 07/04/1928 Today's Date: 08/09/2015    History of Present Illness Pt is an 82 y.o.female s/p L TKA 08/08/15 d/t degenerative arthrosis.  Per notes, pt with pain and anxiety post-op.   Clinical Impression   Pt. Is a 82 y.o. female who was admitted for a left TKR. Pt presents with limited ROM, Pain, weakness, and impaired functional mobility which hinder her ability to complete ADL and IADL tasks. Pt. could benefit from skilled OT services to review A/E use for LE ADLs, to review necessary home modifications, and to improve functional mobility for ADL/IADLs in order to work towards regaining Independence with ADL/IADLs. Pt. is planning to go to SNF upon discharge.    Follow Up Recommendations  SNF    Equipment Recommendations       Recommendations for Other Services PT consult     Precautions / Restrictions Precautions Precautions: Fall Precaution Comments: L KI if not able to perform SLR independently Restrictions Weight Bearing Restrictions: Yes LLE Weight Bearing: Weight bearing as tolerated      Mobility Bed Mobility         Transfers          General transfer comment: Pt. up in chair upon arrival. Pt. seen at chair level.    Balance Overall balance assessment: Needs assistance Sitting balance-Leahy Scale: Good                            ADL Overall ADL's : Needs assistance/impaired Eating/Feeding: Independent   Grooming: Independent               Lower Body Dressing: Maximal assistance                       Vision     Perception     Praxis      Pertinent Vitals/Pain Pain Assessment: 0-10 Pain Score: 6  Pain Location: L knee Pain Descriptors / Indicators: Aching      Hand Dominance     Extremity/Trunk Assessment Upper Extremity Assessment Upper Extremity Assessment: Generalized weakness       Communication  Communication Communication: No difficulties   Cognition Arousal/Alertness: Awake/alert Behavior During Therapy: Anxious Overall Cognitive Status: No family/caregiver present to determine baseline cognitive functioning       Memory: Decreased recall of precautions             General Comments       Exercises       Shoulder Instructions      Home Living Family/patient expects to be discharged to:: Private residence Living Arrangements: Alone   Type of Home: House Home Access: Stairs to enter Secretary/administratorntrance Stairs-Number of Steps: 1 Entrance Stairs-Rails: None Home Layout: One level     Bathroom Shower/Tub: Walk-in shower;Door         Home Equipment: Walker - 2 wheels;Bedside commode;Shower seat;Grab bars - toilet;Grab bars - tub/shower          Prior Functioning/Environment Level of Independence: Independent with assistive device(s)        Comments: Pt reports being independent with RW prior to admission and denies any falls in past 6 months.    OT Diagnosis: Generalized weakness   OT Problem List: Decreased strength;Decreased activity tolerance;Impaired balance (sitting and/or standing);Pain;Decreased knowledge of use of DME or AE;Decreased range of motion   OT Treatment/Interventions: Self-care/ADL training;Therapeutic exercise;Therapeutic activities;DME and/or AE  instruction;Patient/family education    OT Goals(Current goals can be found in the care plan section) Acute Rehab OT Goals Patient Stated Goal: To feel better, and regain independence OT Goal Formulation: With patient Time For Goal Achievement: 08/23/15  OT Frequency: Min 1X/week   Barriers to D/C:            Co-evaluation              End of Session    Activity Tolerance: Patient tolerated treatment well;Patient limited by fatigue;Patient limited by pain Patient left: in chair;with call bell/phone within reach;with chair alarm set   Time: 1115-1130 OT Time Calculation (min): 15  min Charges:  OT General Charges $OT Visit: 1 Procedure OT Evaluation $OT Eval Moderate Complexity: 1 Procedure G-Codes:    Olegario Messier, MS, OTR/L Olegario Messier 08/09/2015, 11:47 AM

## 2015-08-09 NOTE — Progress Notes (Signed)
   Subjective: 1 Day Post-Op Procedure(s) (LRB): COMPUTER ASSISTED TOTAL KNEE ARTHROPLASTY (Left) Patient reports pain as severe.   Patient is Well with the one night had anxiety attacks. Patient concerned about the trembling that she has. Concerned that this is going be a permanent situation. After talking with the patient for few minutes the patient relaxed she no longer had any trembling. Dr. Ernest PineHooten has discussed with the patient that this trembling should be short-term and is due to probably anxiety. patient had a very restless night. Difficult time getting her pain under control. Medications were modified. We will start therapy today.  Plan is to go Rehab after hospital stay. no nausea and no vomiting Patient denies any chest pains or shortness of breath. Objective: Vital signs in last 24 hours: Temp:  [96.3 F (35.7 C)-99.1 F (37.3 C)] 98.4 F (36.9 C) (05/02 0419) Pulse Rate:  [67-120] 90 (05/02 0419) Resp:  [16-21] 18 (05/02 0419) BP: (96-139)/(39-74) 118/60 mmHg (05/02 0419) SpO2:  [94 %-100 %] 98 % (05/02 0419) Weight:  [74.844 kg (165 lb)] 74.844 kg (165 lb) (05/01 0945) Heels are non tender and elevated off the bed using rolled towels Intake/Output from previous day: 05/01 0701 - 05/02 0700 In: 4086.4 [P.O.:240; I.V.:2841.7; Blood:604.7; IV Piggyback:400] Out: 2305 [Urine:1280; Drains:975; Blood:50] Intake/Output this shift: Total I/O In: 2146.4 [I.V.:1141.7; Blood:604.7; IV Piggyback:400] Out: 1600 [Urine:1000; Drains:600]   Recent Labs  08/09/15 0449  HGB 12.1    Recent Labs  08/09/15 0449  WBC 7.3  RBC 3.93  HCT 36.3  PLT 162    Recent Labs  08/09/15 0449  NA 139  K 4.0  CL 108  CO2 24  BUN 21*  CREATININE 0.93  GLUCOSE 112*  CALCIUM 8.1*   No results for input(s): LABPT, INR in the last 72 hours.  EXAM General - Patient is Alert, Appropriate and Oriented Extremity - Neurologically intact Neurovascular intact Sensation intact  distally Intact pulses distally Compartment soft Dressing - dressing C/D/I Motor Function - intact, moving foot and toes well on exam.    Past Medical History  Diagnosis Date  . Depression   . TIA (transient ischemic attack)   . Complication of anesthesia     very confused for days  . Hypothyroidism   . Coronary artery disease   . Carotid artery occlusion   . Hyperlipemia   . Hypertension     h/o not on medication  . Insomnia   . Arthritis   . PUD (peptic ulcer disease)     in distant past    Assessment/Plan: 1 Day Post-Op Procedure(s) (LRB): COMPUTER ASSISTED TOTAL KNEE ARTHROPLASTY (Left) Active Problems:   S/P total knee arthroplasty  Estimated body mass index is 25.84 kg/(m^2) as calculated from the following:   Height as of this encounter: 5\' 7"  (1.702 m).   Weight as of this encounter: 74.844 kg (165 lb). Advance diet Up with therapy D/C IV fluids Plan for discharge tomorrow Discharge to SNF  Labs: Were reviewed DVT Prophylaxis - Lovenox, Foot Pumps and TED hose Weight-Bearing as tolerated to left leg D/C O2 and Pulse OX and try on Room Air Begin working on having a bowel movement Labs tomorrow morning  Emmie Frakes R. [facility] LLCWolfe PA [facility] 08/09/2015, 6:52 AM

## 2015-08-10 ENCOUNTER — Encounter
Admission: RE | Admit: 2015-08-10 | Discharge: 2015-08-10 | Disposition: A | Discharge status: Home / Self Care | Payer: Medicare Other | Source: Ambulatory Visit | Attending: Internal Medicine | Admitting: Internal Medicine

## 2015-08-10 LAB — BASIC METABOLIC PANEL
ANION GAP: 6 (ref 5–15)
BUN: 15 mg/dL (ref 6–20)
CHLORIDE: 106 mmol/L (ref 101–111)
CO2: 26 mmol/L (ref 22–32)
CREATININE: 0.63 mg/dL (ref 0.44–1.00)
Calcium: 8.4 mg/dL — ABNORMAL LOW (ref 8.9–10.3)
GFR calc non Af Amer: >60 mL/min (ref >60)
Glucose, Bld: 132 mg/dL — ABNORMAL HIGH (ref 65–99)
POTASSIUM: 4.1 mmol/L (ref 3.5–5.1)
SODIUM: 138 mmol/L (ref 135–145)

## 2015-08-10 LAB — CBC
HEMATOCRIT: 31.9 % — AB (ref 35.0–47.0)
HEMOGLOBIN: 10.7 g/dL — AB (ref 12.0–16.0)
MCH: 30 pg (ref 26.0–34.0)
MCHC: 33.7 g/dL (ref 32.0–36.0)
MCV: 89 fL (ref 80.0–100.0)
Platelets: 156 10*3/uL (ref 150–440)
RBC: 3.58 MIL/uL — AB (ref 3.80–5.20)
RDW: 13.6 % (ref 11.5–14.5)
WBC: 7.4 10*3/uL (ref 3.6–11.0)

## 2015-08-10 MED ORDER — OXYCODONE HCL 5 MG PO TABS: 5.0000 mg | ORAL_TABLET | ORAL | Status: DC | PRN

## 2015-08-10 MED ORDER — TRAMADOL HCL 50 MG PO TABS: 50.0000 mg | ORAL_TABLET | ORAL | Status: DC | PRN

## 2015-08-10 MED ORDER — ENOXAPARIN SODIUM 30 MG/0.3ML ~~LOC~~ SOLN: 30.0000 mg | Freq: Two times a day (BID) | SUBCUTANEOUS | Status: DC

## 2015-08-10 NOTE — Discharge Summary (Signed)
Physician Discharge Summary  Patient ID: Kerry Cabrera MRN: [id number] DOB/AGE: 80-Sep-1930 80 y.o.  Admit date: 08/08/2015 Discharge date: 08/11/2015  Admission Diagnoses:  PRIMARY OSTEOARTHRITIS   Discharge Diagnoses: Patient Active Problem List   Diagnosis Date Noted  . S/P total knee arthroplasty 08/08/2015    Past Medical History  Diagnosis Date  . Depression   . TIA (transient ischemic attack)   . Complication of anesthesia     very confused for days  . Hypothyroidism   . Coronary artery disease   . Carotid artery occlusion   . Hyperlipemia   . Hypertension     h/o not on medication  . Insomnia   . Arthritis   . PUD (peptic ulcer disease)     in distant past     Transfusion: Autovac transfusions given doing this admission   Consultants (if any):   case management for placement  Discharged Condition: Improved  Hospital Course: Kerry Cabrera is an 82 y.o. female who was admitted 08/08/2015 with a diagnosis of degenerative arthrosis left knee and went to the operating room on 08/08/2015 and underwent the above named procedures.    Surgeries:Procedure(s): COMPUTER ASSISTED TOTAL KNEE ARTHROPLASTY on 08/08/2015  PRE-OPERATIVE DIAGNOSIS: Degenerative arthrosis of the left knee, primary  POST-OPERATIVE DIAGNOSIS: Same  PROCEDURE: Left total knee arthroplasty using computer-assisted navigation  SURGEON: Jena Gauss. M.D.  ASSISTANT: Van Clines, PA (present and scrubbed throughout the case, critical for assistance with exposure, retraction, instrumentation, and closure)  ANESTHESIA: spinal  ESTIMATED BLOOD LOSS: 50 mL  FLUIDS REPLACED: 1600 mL of crystalloid  TOURNIQUET TIME: 97 minutes  DRAINS: 2 medium drains to a reinfusion system  SOFT TISSUE RELEASES: Anterior cruciate ligament, posterior cruciate ligament, deep medial collateral ligament, patellofemoral ligament, and posterolateral corner  IMPLANTS UTILIZED: DePuy Attune size 7 posterior  stabilized femoral component (cemented), size 6 rotating platform tibial component (cemented), 38 mm medialized dome patella (cemented), and a 7 mm stabilized rotating platform polyethylene insert.  INDICATIONS FOR SURGERY: Kerry Cabrera is a 82 y.o. year old female with a long history of progressive knee pain. X-rays demonstrated severe degenerative changes in tricompartmental fashion. The patient had not seen any significant improvement despite conservative nonsurgical intervention. After discussion of the risks and benefits of surgical intervention, the patient expressed understanding of the risks benefits and agree with plans for total knee arthroplasty.   The risks, benefits, and alternatives were discussed at length including but not limited to the risks of infection, bleeding, nerve injury, stiffness, blood clots, the need for revision surgery, cardiopulmonary complications, among others, and they were willing to proceed. Patient tolerated the surgery well. No complications .Patient was taken to PACU where she was stabilized and then transferred to the orthopedic floor.  Patient started on Lovenox 30 q 12 hrs. Foot pumps applied bilaterally at 80 mm hg. Heels elevated off bed with rolled towels. No evidence of DVT. Calves non tender. Negative Homan. Physical therapy started on day #1 for gait training and transfer with OT starting on  day #1 for ADL and assisted devices. Patient has done well with therapy. Ambulated very minimal  upon being discharged.  Patient's IV and Foley were discontinued on day #1 with Hemovac being discontinued on day #2. Dressing was also changed on day #2.   She was given perioperative antibiotics:  Anti-infectives    Start     Dose/Rate Route Frequency Ordered Stop   08/08/15 1800  ceFAZolin (ANCEF) IVPB 2g/100 mL premix  2 g 200 mL/hr over 30 Minutes Intravenous Every 6 hours 08/08/15 1503 08/09/15 1419   08/08/15 1030  ceFAZolin (ANCEF) IVPB 2g/100 mL  premix  Status:  Discontinued     2 g 200 mL/hr over 30 Minutes Intravenous On call to O.R. 08/08/15 1029 08/08/15 1456   08/08/15 0937  ceFAZolin (ANCEF) 2-4 GM/100ML-% IVPB    Comments:  LEWIS, CINDY: cabinet override      08/08/15 0937 08/08/15 1057   08/08/15 0047  ceFAZolin (ANCEF) IVPB 2g/100 mL premix  Status:  Discontinued     2 g 200 mL/hr over 30 Minutes Intravenous On call to O.R. 08/08/15 [id number] 08/08/15 [id number]    .  She was fitted with AV 1 compression foot pump devices bilaterally, instructed on heel pumps, early ambulation, and fitted with TED stockings bilaterally for DVT prophylaxis.  She benefited maximally from the hospital stay and there were no complications.    Recent vital signs:  Filed Vitals:   08/09/15 1937 08/10/15 0328  BP: 155/64 139/54  Pulse: 105 100  Temp: 99.1 F (37.3 C) 98.7 F (37.1 C)  Resp: 18 18    Recent laboratory studies:  Lab Results  Component Value Date   HGB 10.7* 08/10/2015   HGB 12.1 08/09/2015   HGB 14.2 07/27/2015   Lab Results  Component Value Date   WBC 7.4 08/10/2015   PLT 156 08/10/2015   Lab Results  Component Value Date   INR 0.96 07/27/2015   Lab Results  Component Value Date   NA 138 08/10/2015   K 4.1 08/10/2015   CL 106 08/10/2015   CO2 26 08/10/2015   BUN 15 08/10/2015   CREATININE 0.63 08/10/2015   GLUCOSE 132* 08/10/2015    Discharge Medications:     Medication List    STOP taking these medications        aspirin 81 MG tablet     Fish Oil 1000 MG Caps      TAKE these medications        enoxaparin 30 MG/0.3ML injection  Commonly known as:  LOVENOX  Inject 0.3 mLs (30 mg total) into the skin every 12 (twelve) hours.     EYE HEALTH FORMULA PO  Take 1 capsule by mouth daily.     flurazepam 15 MG capsule  Commonly known as:  DALMANE  Take 15 mg by mouth at bedtime as needed for sleep. Reported on 08/08/2015     furosemide 20 MG tablet  Commonly known as:  LASIX  Take 20 mg by mouth  daily as needed for edema. Reported on 08/08/2015     multivitamin tablet  Take 1 tablet by mouth daily.     OSTEO BI-FLEX TRIPLE STRENGTH Tabs  Take 1 tablet by mouth daily.     oxyCODONE 5 MG immediate release tablet  Commonly known as:  Oxy IR/ROXICODONE  Take 1-2 tablets (5-10 mg total) by mouth every 3 (three) hours as needed for severe pain or breakthrough pain.     polyethylene glycol powder powder  Commonly known as:  GLYCOLAX/MIRALAX  Take 1 Container by mouth once. 4 tablespoons at night     senna 8.6 MG tablet  Commonly known as:  SENOKOT  Take 1 tablet by mouth daily. Reported on 08/08/2015     SYNTHROID PO  Take by mouth.     traMADol 50 MG tablet  Commonly known as:  ULTRAM  Take 1-2 tablets (50-100 mg total) by mouth every 4 (four) hours  as needed for moderate pain.     VITAMIN B 12 PO  Take 1,000 mcg by mouth daily.     WELLBUTRIN PO  Take by mouth.        Diagnostic Studies: Dg Knee Left Port  08/08/2015  CLINICAL DATA:  Post total knee replacement EXAM: PORTABLE LEFT KNEE - 1-2 VIEW COMPARISON:  Portable exam 1415 hours without priors for comparison. FINDINGS: Components of LEFT knee prosthesis identified in expected positions. Bones demineralized. No fracture dislocation. Skin clips and anterior surgical drains and noted. Bony excrescence identified at medial aspect of proximal LEFT tibial metaphysis question osteochondroma. IMPRESSION: LEFT knee prosthesis without acute complication. Question osteochondroma of the proximal LEFT tibia. Electronically Signed   By: Ulyses Southward M.D.   On: 08/08/2015 14:30    Disposition: 07-Left Against Medical Advice or Left Without Being Seen      Discharge Instructions    Diet - low sodium heart healthy    Complete by:  As directed      Increase activity slowly    Complete by:  As directed            Follow-up Information    Follow up with [facility] R., PA On 08/23/2015.   Specialty:  Physician Assistant   Why:  At  1:45 PM   Contact information:   [address] [facility] [place] [address] [phone]       Follow up with Donato Heinz, MD On 09/20/2015.   Specialty:  Orthopedic Surgery   Why:  At 9:30 AM   Contact information:   [address] [facility] [place] [address] [phone]     Patient may weight-bear as tolerated. Change dressing as needed Staples will be removed when she comes to [facility] in 2 weeks No showering until the staples are  Removed Elevate heels off the bed using rolled towels. Both lower extremities   Signed: WOLFE,JON R. 08/10/2015, 7:15 AM

## 2015-08-10 NOTE — Progress Notes (Signed)
Physical Therapy Treatment Patient Details Name: Kerry Cabrera MRN: [id number] DOB: 1928-10-18 Today's Date: 08/10/2015    History of Present Illness Pt is an 82 y.o.female s/p L TKA 08/08/15 d/t degenerative arthrosis.  Per notes, pt with pain and anxiety post-op.    PT Comments    Pt noted to have increased bloody drainage from L hemovac site (hemovac was removed this morning) requiring assist for nursing to re-dress area and stop bleeding (nursing requesting no L knee flexion this AM d/t bleeding).  Pt very anxious requiring increased time for all mobility and able to take a a few steps with RW this morning.  Will perform L knee flexion ROM and attempt increased ambulation distance this afternoon.   Follow Up Recommendations  SNF     Equipment Recommendations   (pt already owns RW)    Recommendations for Other Services       Precautions / Restrictions Precautions Precautions: Fall Precaution Comments: L KI if not able to perform x10 SLR independently Restrictions Weight Bearing Restrictions: Yes LLE Weight Bearing: Weight bearing as tolerated    Mobility  Bed Mobility Overal bed mobility: Needs Assistance Bed Mobility: Supine to Sit     Supine to sit: Mod assist;Max assist     General bed mobility comments: assist for B LE's and trunk; increased time and cueing to scoot forward to edge of bed  Transfers Overall transfer level: Needs assistance Equipment used: Rolling walker (2 wheeled) Transfers: Sit to/from Stand Sit to Stand: Mod assist         General transfer comment: Multiple attempts to stand initially (pt reporting unable to stand d/t concerns of falling) but with time pt able to calm down and stand with cueing (for hand and feet placement) and with mod assist.  Ambulation/Gait Ambulation/Gait assistance: Min assist Ambulation Distance (Feet): 5 Feet Assistive device: Rolling walker (2 wheeled)   Gait velocity: decreased   General Gait Details:  antalgic; decreased stance time L LE; vc's required for gait technique and walker use; limited distance d/t pt reporting concerns of falling (pt with increased anxiety)   Stairs            Wheelchair Mobility    Modified Rankin (Stroke Patients Only)       Balance Overall balance assessment: Needs assistance Sitting-balance support: Bilateral upper extremity supported;Feet supported Sitting balance-Leahy Scale: Fair Sitting balance - Comments: posterior lean initially upon sitting but with cueing able to sit with SBA   Standing balance support: Bilateral upper extremity supported (on RW) Standing balance-Leahy Scale: Fair                      Cognition Arousal/Alertness: Awake/alert Behavior During Therapy: Anxious Overall Cognitive Status: Impaired/Different from baseline Area of Impairment:  (Oriented x4 but confusion noted during session)     Memory: Decreased recall of precautions              Exercises Total Joint Exercises Ankle Circles/Pumps: AROM;Strengthening;Both;10 reps;Supine Quad Sets: AROM;Strengthening;Both;10 reps;Supine Gluteal Sets: AROM;Strengthening;Both;10 reps;Supine Short Arc Quad: AROM;Strengthening;Right;10 reps;Supine Heel Slides: AROM;Strengthening;Right;10 reps;Supine Hip ABduction/ADduction: AROM;Strengthening;Both;10 reps;Supine Goniometric ROM: L knee extension 8 degrees short of neutral semi-supine in bed; deferred flexion for PM session    General Comments General comments (skin integrity, edema, etc.): Significant bloody drainage noted on pillowcase under polar care (over pt's knee) and site from L hemovac removal noted to be continuously bleeding; nursing notified and came to address (nursing held pressure and then reinforced with  dressing and requested no bending of knee this AM but ok to get pt up)      Pertinent Vitals/Pain Pain Assessment: 0-10 Pain Score: 6  Pain Location: L knee Pain Descriptors / Indicators:  Aching;Sore;Operative site guarding;Tender Pain Intervention(s): Limited activity within patient's tolerance;Monitored during session;Premedicated before session;Repositioned;Patient requesting pain meds-RN notified    Home Living                      Prior Function            PT Goals (current goals can now be found in the care plan section) Acute Rehab PT Goals Patient Stated Goal: to have less pain PT Goal Formulation: With patient Time For Goal Achievement: 08/23/15 Potential to Achieve Goals: Good Additional Goals Additional Goal #1: Pt's L knee ROM at least 0-90 degrees. Progress towards PT goals: Progressing toward goals    Frequency  BID    PT Plan Current plan remains appropriate    Co-evaluation             End of Session Equipment Utilized During Treatment: Gait belt Activity Tolerance: Patient limited by pain (Limited d/t anxiety) Patient left: in chair;with call bell/phone within reach;with chair alarm set;with SCD's reapplied (B heels elevated via towel rolls; polar care in place and activated)     Time: [id number] PT Time Calculation (min) (ACUTE ONLY): 38 min  Charges:  $Therapeutic Exercise: 8-22 mins $Therapeutic Activity: 23-37 mins                    G CodesHendricks Limes:      Makarios Madlock 08/10/2015, 11:31 AM Hendricks LimesEmily Fard Borunda, PT [phone]

## 2015-08-10 NOTE — Progress Notes (Signed)
   Subjective: 2 Days Post-Op Procedure(s) (LRB): COMPUTER ASSISTED TOTAL KNEE ARTHROPLASTY (Left) Patient reports pain as 10/10 when awake but sleeping well. does not appear to be in that much pain this am..   Patient is well but pleasantly confused at times. Continue with physical therapy today.  Plan is to go Rehab after hospital stay. no nausea and no vomiting Patient denies any chest pains or shortness of breath. Objective: Vital signs in last 24 hours: Temp:  [98.5 F (36.9 C)-99.1 F (37.3 C)] 98.7 F (37.1 C) (05/03 0328) Pulse Rate:  [87-105] 100 (05/03 0328) Resp:  [18] 18 (05/03 0328) BP: (132-155)/(50-64) 139/54 mmHg (05/03 0328) SpO2:  [95 %-98 %] 97 % (05/03 0328) well approximated incision Heels are non tender and elevated off the bed using rolled towels Intake/Output from previous day: 05/02 0701 - 05/03 0700 In: 480 [P.O.:480] Out: 1170 [Urine:900; Drains:270] Intake/Output this shift:     Recent Labs  08/09/15 0449 08/10/15 0402  HGB 12.1 10.7*    Recent Labs  08/09/15 0449 08/10/15 0402  WBC 7.3 7.4  RBC 3.93 3.58*  HCT 36.3 31.9*  PLT 162 156    Recent Labs  08/09/15 0449 08/10/15 0402  NA 139 138  K 4.0 4.1  CL 108 106  CO2 24 26  BUN 21* 15  CREATININE 0.93 0.63  GLUCOSE 112* 132*  CALCIUM 8.1* 8.4*   No results for input(s): LABPT, INR in the last 72 hours.  EXAM General - Patient is Alert, Appropriate, Oriented and But is confused at times. Extremity - Neurologically intact Neurovascular intact Sensation intact distally Intact pulses distally Dorsiflexion/Plantar flexion intact Compartment soft Dressing - moderate drainage Motor Function - intact, moving foot and toes well on exam.    Past Medical History  Diagnosis Date  . Depression   . TIA (transient ischemic attack)   . Complication of anesthesia     very confused for days  . Hypothyroidism   . Coronary artery disease   . Carotid artery occlusion   .  Hyperlipemia   . Hypertension     h/o not on medication  . Insomnia   . Arthritis   . PUD (peptic ulcer disease)     in distant past    Assessment/Plan: 2 Days Post-Op Procedure(s) (LRB): COMPUTER ASSISTED TOTAL KNEE ARTHROPLASTY (Left) Active Problems:   S/P total knee arthroplasty  Estimated body mass index is 25.84 kg/(m^2) as calculated from the following:   Height as of this encounter: 5\' 7"  (1.702 m).   Weight as of this encounter: 74.844 kg (165 lb). Up with therapy Plan for discharge tomorrow Discharge to SNF  Labs: Were reviewed DVT Prophylaxis - Lovenox, Foot Pumps and TED hose Weight-Bearing as tolerated to left leg Hemovac was discontinued on today's visit along with dressing change.  Lynnda ShieldsJon R. Hosp [facility]Wolfe PA [facility] 08/10/2015, 7:08 AM

## 2015-08-10 NOTE — Discharge Instructions (Signed)

## 2015-08-10 NOTE — Progress Notes (Signed)
Physical Therapy Treatment Patient Details Name: Kerry Cabrera MRN: [id number] DOB: Jun 17, 1928 Today's Date: 08/10/2015    History of Present Illness Pt is an 82 y.o.female s/p L TKA 08/08/15 d/t degenerative arthrosis.  Per notes, pt with pain and anxiety post-op.    PT Comments    Pt able to progress to ambulating 15 feet with RW with assist (limited distance d/t pt with significant increased anxiety with activity and requiring chair follow for safety).  L knee flexion ROM 82 degrees this afternoon in sitting.  Will continue to progress pt with ambulation distance, decrease assist needs with functional mobility, and LE ROM/strengthening per pt tolerance.   Follow Up Recommendations  SNF     Equipment Recommendations   (pt already owns RW)    Recommendations for Other Services       Precautions / Restrictions Precautions Precautions: Fall Precaution Comments: L KI if not able to perform x10 SLR independently Restrictions Weight Bearing Restrictions: Yes LLE Weight Bearing: Weight bearing as tolerated    Mobility  Bed Mobility Overal bed mobility: Needs Assistance Bed Mobility: Supine to Sit;Sit to Supine     Supine to sit: Mod assist;Max assist;HOB elevated Sit to supine: Max assist;HOB elevated   General bed mobility comments: assist for B LE's and trunk supine to/from sit; increased time and cueing to scoot forward to edge of bed; vc's required for technique  Transfers Overall transfer level: Needs assistance Equipment used: Rolling walker (2 wheeled) Transfers: Sit to/from [employer] Sit to Stand: Mod assist Stand pivot transfers: Min assist (recliner to bed)       General transfer comment: increased effort and time to stand; vc's required for hand and feet placement; x2 trials sit to/from stand  Ambulation/Gait Ambulation/Gait assistance: Min assist;+2 safety/equipment Ambulation Distance (Feet): 15 Feet Assistive device: Rolling walker (2  wheeled) Gait Pattern/deviations: Step-to pattern;Decreased stance time - left Gait velocity: decreased   General Gait Details: vc's required for gait technique and walker use; limited distance d/t pt reporting concerns of falling (pt with significant increased anxiety with ambulation)   Stairs            Wheelchair Mobility    Modified Rankin (Stroke Patients Only)       Balance Overall balance assessment: Needs assistance Sitting-balance support: Bilateral upper extremity supported;Feet supported Sitting balance-Leahy Scale: Fair Sitting balance - Comments: posterior lean initially upon sitting but with cueing able to sit with SBA   Standing balance support: Bilateral upper extremity supported (on RW) Standing balance-Leahy Scale: Fair                      Cognition Arousal/Alertness: Awake/alert Behavior During Therapy: Anxious Overall Cognitive Status: Impaired/Different from baseline Area of Impairment:  (Oriented x4 but confusion noted during session)     Memory: Decreased recall of precautions              Exercises Total Joint Exercises Goniometric ROM: L knee extension 8 degrees short of neutral semi-supine in bed (obtained in AM session); L knee flexion 82 degrees sitting edge of bed (obtained in PM session)    General Comments General comments (skin integrity, edema, etc.): pt laying in bed resting (nursing reports pt unable to make it too long in chair after this morning's PT session)  Nursing cleared pt for participation in physical therapy.  Pt agreeable to PT session with some encouragement.      Pertinent Vitals/Pain Pain Assessment: 0-10 Pain Score: 5  Pain Location: L knee Pain Descriptors / Indicators: Aching;Sore;Tender;Operative site guarding Pain Intervention(s): Limited activity within patient's tolerance;Monitored during session;Premedicated before session;Repositioned;Ice applied    Home Living                       Prior Function            PT Goals (current goals can now be found in the care plan section) Acute Rehab PT Goals Patient Stated Goal: to have less pain PT Goal Formulation: With patient Time For Goal Achievement: 08/23/15 Potential to Achieve Goals: Good Additional Goals Additional Goal #1: Pt's L knee ROM at least 0-90 degrees. Progress towards PT goals: Progressing toward goals    Frequency  BID    PT Plan Current plan remains appropriate    Co-evaluation             End of Session Equipment Utilized During Treatment: Gait belt Activity Tolerance: Patient limited by pain (Limited d/t anxiety) Patient left: in bed;with call bell/phone within reach;with bed alarm set;with SCD's reapplied (B heels elevated via towel rolls; polar care in place and activated)     Time: [id number] PT Time Calculation (min) (ACUTE ONLY): 23 min  Charges:  $Gait Training: 8-22 mins $Therapeutic Activity: 8-22 mins                    G CodesHendricks Limes:      Arman Loy 08/10/2015, 2:28 PM Hendricks LimesEmily Timisha Mondry, PT [phone]

## 2015-08-10 NOTE — Care Management Important Message (Signed)
Important Message  Patient Details  Name: Celesta Gentileatti B Waugh MRN: [id number] Date of Birth: 08/28/1928   Medicare Important Message Given:  Yes    Olegario MessierKathy A Kadija Cruzen 08/10/2015, 10:04 AM

## 2015-08-10 NOTE — Progress Notes (Signed)
Plan [facility] pending medical clearance. Kim [facility] is aware of above. Clinical Social Worker (CSW) will continue to follow and assist as needed.   Jetta LoutBailey Morgan, LCSW [phone]

## 2015-08-10 NOTE — Clinical Social Work Placement (Signed)
   CLINICAL SOCIAL WORK PLACEMENT  NOTE  Date:  08/10/2015  Patient Details  Name: Celesta Gentileatti B Dandy MRN: [id number] Date of Birth: 12/16/1928  Clinical Social Work is seeking post-discharge placement for this patient at the Skilled  Nursing Facility level of care (*CSW will initial, date and re-position this form in  chart as items are completed):  Yes   Patient/family provided with [place] Clinical Social Work Department's list of facilities offering this level of care within the geographic area requested by the patient (or if unable, by the patient's family).  Yes   Patient/family informed of their freedom to choose among providers that offer the needed level of care, that participate in Medicare, Medicaid or managed care program needed by the patient, have an available bed and are willing to accept the patient.  Yes   Patient/family informed of [facility]'s ownership interest in [facility] and [facility], as well as of the fact that they are under no obligation to receive care at these facilities.  PASRR submitted to EDS on       PASRR number received on       Existing PASRR number confirmed on 08/09/15     FL2 transmitted to all facilities in geographic area requested by pt/family on 08/09/15     FL2 transmitted to all facilities within larger geographic area on       Patient informed that his/her managed care company has contracts with or will negotiate with certain facilities, including the following:        Yes   Patient/family informed of bed offers received.  Patient chooses bed at  [facility]     Physician recommends and patient chooses bed at      Patient to be transferred to   on  .  Patient to be transferred to facility by       Patient family notified on   of transfer.  Name of family member notified:        PHYSICIAN       Additional Comment:    _______________________________________________ Haig ProphetMorgan, Dwanda Tufano G, LCSW 08/10/2015, 9:19  AM

## 2015-08-10 NOTE — Progress Notes (Signed)
OT Cancellation Note  Patient Details Name: Kerry Cabrera MRN: [id number] DOB: 01/31/1929   Cancelled Treatment:    Reason Eval/Treat Not Completed: Patient declined, no reason specified Pt. Education was provided about A/E.  Kerry MessierElaine Xochilth Standish, MS, OTR/L   Kerry Cabrera 08/10/2015, 11:13 AM

## 2015-08-11 MED ORDER — LACTULOSE 10 GM/15ML PO SOLN: 10.0000 g | Freq: Two times a day (BID) | ORAL | Status: DC | PRN

## 2015-08-11 NOTE — Progress Notes (Signed)
EMS is here to transport the pt to [facility].  Belongings sent with the pt

## 2015-08-11 NOTE — Progress Notes (Signed)
Patient is medically stable for D/C to [facility] today. Per Kim admissions coordinator [facility] will go to room 204-B. RN will call report at [phone] and arrange EMS for transport. Clinical Child psychotherapistocial Worker (CSW) sent D/C Summary, FL2 and D/C Packet to [employer] via [employer]. Patient is aware of above. CSW contacted patient's daughter [name] and left her a voicemail. Please reconsult if future social work needs arise. CSW signing off.   Jetta LoutBailey Morgan, LCSW [phone]

## 2015-08-11 NOTE — Clinical Social Work Placement (Signed)
   CLINICAL SOCIAL WORK PLACEMENT  NOTE  Date:  08/11/2015  Patient Details  Name: Kerry Cabrera MRN: [id number] Date of Birth: 09/07/1928  Clinical Social Work is seeking post-discharge placement for this patient at the Skilled  Nursing Facility level of care (*CSW will initial, date and re-position this form in  chart as items are completed):  Yes   Patient/family provided with [facility] Clinical Social Work Department's list of facilities offering this level of care within the geographic area requested by the patient (or if unable, by the patient's family).  Yes   Patient/family informed of their freedom to choose among providers that offer the needed level of care, that participate in Medicare, Medicaid or managed care program needed by the patient, have an available bed and are willing to accept the patient.  Yes   Patient/family informed of [facility]'s ownership interest in [facility] and [facility], as well as of the fact that they are under no obligation to receive care at these facilities.  PASRR submitted to EDS on       PASRR number received on       Existing PASRR number confirmed on 08/09/15     FL2 transmitted to all facilities in geographic area requested by pt/family on 08/09/15     FL2 transmitted to all facilities within larger geographic area on       Patient informed that his/her managed care company has contracts with or will negotiate with certain facilities, including the following:        Yes   Patient/family informed of bed offers received.  Patient chooses bed at  [facility]     Physician recommends and patient chooses bed at      Patient to be transferred to  [facility] ) on 08/11/15.  Patient to be transferred to facility by  [facility] )     Patient family notified on 08/11/15 of transfer.  Name of family member notified:   (CSW left patient's daughter [name] a Engineer, technical salesvoicemail. )     PHYSICIAN       Additional  Comment:    _______________________________________________ Haig ProphetMorgan, Ainhoa Rallo G, LCSW 08/11/2015, 11:07 AM

## 2015-08-11 NOTE — Progress Notes (Signed)
Physical Therapy Treatment Patient Details Name: Kerry Cabrera MRN: [id number] DOB: 25-Jul-1928 Today's Date: 08/11/2015    History of Present Illness      PT Comments    Pt in bed, agrees to session.  Overall less anxious and in less pain allowing better participation in therapy.  Exercises as described below.  To edge of bed with min guard x 1 and moderate verbal cues.  She was able to ambulate 104' 45' with walker and min assist x 1.  Gait generally steady with no knee buckling noted.  PT with dec step length and height.  Decreased speed.  Limited by pain.    Follow Up Recommendations  SNF     Equipment Recommendations       Recommendations for Other Services       Precautions / Restrictions Restrictions Weight Bearing Restrictions: Yes LLE Weight Bearing: Weight bearing as tolerated    Mobility  Bed Mobility Overal bed mobility: Modified Independent Bed Mobility: Supine to Sit     Supine to sit: Min assist;HOB elevated     General bed mobility comments: verbal cues for sequencing and hand placement  Transfers Overall transfer level: Needs assistance Equipment used: Rolling walker (2 wheeled) Transfers: Sit to/from Stand Sit to Stand: Min assist Stand pivot transfers: Min assist       General transfer comment: less anxiety today allwoing for better mobility  Ambulation/Gait Ambulation/Gait assistance: Min assist Ambulation Distance (Feet): 45 Feet Assistive device: Rolling walker (2 wheeled) Gait Pattern/deviations: Step-to pattern Gait velocity: decreased Gait velocity interpretation: <1.8 ft/sec, indicative of risk for recurrent falls General Gait Details: decreased anxiety   Stairs            Wheelchair Mobility    Modified Rankin (Stroke Patients Only)       Balance Overall balance assessment: Needs assistance Sitting-balance support: Feet supported Sitting balance-Leahy Scale: Good     Standing balance support: Bilateral upper  extremity supported Standing balance-Leahy Scale: Fair                      Cognition Arousal/Alertness: Awake/alert Behavior During Therapy: WFL for tasks assessed/performed Overall Cognitive Status: Within Functional Limits for tasks assessed       Memory: Decreased recall of precautions              Exercises Total Joint Exercises Ankle Circles/Pumps: AROM;Both;20 reps;Supine;Seated Quad Sets: AROM;Left;10 reps;Supine Gluteal Sets: AROM;Strengthening;Both;10 reps;Supine Short Arc Quad: AROM;Strengthening;Right;10 reps;Supine Heel Slides: AROM;Strengthening;Right;10 reps;Supine Hip ABduction/ADduction: AROM;Strengthening;Both;10 reps;Supine Straight Leg Raises: AAROM;Left;10 reps;Supine Long Arc Quad: Left;10 reps;Seated;AAROM Knee Flexion: AROM;Left;10 reps;Seated Goniometric ROM: 5-80 (ext supine, flex sitting edge of bed)    General Comments        Pertinent Vitals/Pain Pain Assessment: 0-10 Pain Score: 6  Pain Location: L knee Pain Descriptors / Indicators: Aching Pain Intervention(s): Limited activity within patient's tolerance;Ice applied;Premedicated before session    Home Living                      Prior Function            PT Goals (current goals can now be found in the care plan section) Progress towards PT goals: Progressing toward goals    Frequency  BID    PT Plan Current plan remains appropriate    Co-evaluation             End of Session Equipment Utilized During Treatment: Gait belt Activity Tolerance: Patient tolerated  treatment well Patient left: in chair;with call bell/phone within reach;with chair alarm set     Time: [id number] PT Time Calculation (min) (ACUTE ONLY): 35 min  Charges:  $Gait Training: 8-22 mins $Therapeutic Exercise: 8-22 mins                    G Codes:      Danielle DessSarah Grenda Lora, PTA 08/11/2015, 10:45 AM

## 2015-08-11 NOTE — Progress Notes (Signed)
   Subjective: 3 Days Post-Op Procedure(s) (LRB): COMPUTER ASSISTED TOTAL KNEE ARTHROPLASTY (Left) Patient reports pain as mild.  Taking just Tylenol for pain Patient is well, and has had no acute complaints or problems Continue with physical therapy today.  Plan is to go Rehab after hospital stay. no nausea and no vomiting Patient denies any chest pains or shortness of breath. Continues to have episodes of confusion. Objective: Vital signs in last 24 hours: Temp:  [98.6 F (37 C)-99.2 F (37.3 C)] 99.2 F (37.3 C) (05/04 0313) Pulse Rate:  [91-96] 91 (05/04 0313) Resp:  [18-19] 19 (05/04 0313) BP: (130-145)/(43-59) 130/43 mmHg (05/04 0313) SpO2:  [92 %-97 %] 92 % (05/04 0313) well approximated incision Heels are non tender and elevated off the bed using rolled towels Intake/Output from previous day: 05/03 0701 - 05/04 0700 In: -  Out: 200 [Urine:200] Intake/Output this shift:     Recent Labs  08/09/15 0449 08/10/15 0402  HGB 12.1 10.7*    Recent Labs  08/09/15 0449 08/10/15 0402  WBC 7.3 7.4  RBC 3.93 3.58*  HCT 36.3 31.9*  PLT 162 156    Recent Labs  08/09/15 0449 08/10/15 0402  NA 139 138  K 4.0 4.1  CL 108 106  CO2 24 26  BUN 21* 15  CREATININE 0.93 0.63  GLUCOSE 112* 132*  CALCIUM 8.1* 8.4*   No results for input(s): LABPT, INR in the last 72 hours.  EXAM General - Patient is Alert, Appropriate and Oriented Extremity - Neurologically intact Neurovascular intact Sensation intact distally Intact pulses distally Compartment soft Dressing - scant drainage Motor Function - intact, moving foot and toes well on exam. Still able to do straight leg raise on her own  Past Medical History  Diagnosis Date  . Depression   . TIA (transient ischemic attack)   . Complication of anesthesia     very confused for days  . Hypothyroidism   . Coronary artery disease   . Carotid artery occlusion   . Hyperlipemia   . Hypertension     h/o not on  medication  . Insomnia   . Arthritis   . PUD (peptic ulcer disease)     in distant past    Assessment/Plan: 3 Days Post-Op Procedure(s) (LRB): COMPUTER ASSISTED TOTAL KNEE ARTHROPLASTY (Left) Active Problems:   S/P total knee arthroplasty  Estimated body mass index is 25.84 kg/(m^2) as calculated from the following:   Height as of this encounter: 5\' 7"  (1.702 m).   Weight as of this encounter: 74.844 kg (165 lb). Up with therapy Discharge to SNF provided patient has a bowel movement today.  Labs: No labs today DVT Prophylaxis - Lovenox, Foot Pumps and TED hose Weight-Bearing as tolerated to left leg Patient needs to have a bowel movement prior to being discharged today. We'll add lactulose. Staples will need to be removed when she comes to the office in 2 weeks Weight-bearing as tolerated Regular diet Change dressing as needed No showering until the staples are removed at 2 weeks. Patient currently has a follow-up appointment in 2 weeks   Lakshmi [facility] PA [facility] 08/11/2015, 7:01 AM

## 2015-08-11 NOTE — Progress Notes (Signed)
Report called to Melody at [facility].  EMS called for transport

## 2015-08-13 LAB — URINALYSIS COMPLETE WITH MICROSCOPIC (ARMC ONLY)
BILIRUBIN URINE: NEGATIVE
GLUCOSE, UA: NEGATIVE mg/dL
Hgb urine dipstick: NEGATIVE
Ketones, ur: NEGATIVE mg/dL
Leukocytes, UA: NEGATIVE
Nitrite: NEGATIVE
PH: 6 (ref 5.0–8.0)
Protein, ur: NEGATIVE mg/dL
Specific Gravity, Urine: 1.013 (ref 1.005–1.030)

## 2015-08-15 LAB — URINE CULTURE

## 2015-09-14 ENCOUNTER — Emergency Department
Admission: EM | Admit: 2015-09-14 | Discharge: 2015-09-14 | Disposition: A | Discharge status: Home / Self Care | Payer: Medicare Other | Attending: Emergency Medicine | Admitting: Emergency Medicine

## 2015-09-14 ENCOUNTER — Encounter: Payer: Self-pay | Admitting: Emergency Medicine

## 2015-09-14 ENCOUNTER — Emergency Department: Payer: Medicare Other

## 2015-09-14 DIAGNOSIS — Z139 Encounter for screening, unspecified: Secondary | ICD-10-CM

## 2015-09-14 DIAGNOSIS — Z87891 Personal history of nicotine dependence: Secondary | ICD-10-CM | POA: Insufficient documentation

## 2015-09-14 DIAGNOSIS — Z79899 Other long term (current) drug therapy: Secondary | ICD-10-CM | POA: Diagnosis not present

## 2015-09-14 DIAGNOSIS — F329 Major depressive disorder, single episode, unspecified: Secondary | ICD-10-CM | POA: Diagnosis not present

## 2015-09-14 DIAGNOSIS — M1990 Unspecified osteoarthritis, unspecified site: Secondary | ICD-10-CM | POA: Diagnosis not present

## 2015-09-14 DIAGNOSIS — Z8673 Personal history of transient ischemic attack (TIA), and cerebral infarction without residual deficits: Secondary | ICD-10-CM | POA: Diagnosis not present

## 2015-09-14 DIAGNOSIS — I10 Essential (primary) hypertension: Secondary | ICD-10-CM | POA: Diagnosis not present

## 2015-09-14 DIAGNOSIS — E785 Hyperlipidemia, unspecified: Secondary | ICD-10-CM | POA: Insufficient documentation

## 2015-09-14 DIAGNOSIS — E039 Hypothyroidism, unspecified: Secondary | ICD-10-CM | POA: Insufficient documentation

## 2015-09-14 DIAGNOSIS — I2510 Atherosclerotic heart disease of native coronary artery without angina pectoris: Secondary | ICD-10-CM | POA: Diagnosis not present

## 2015-09-14 DIAGNOSIS — R4701 Aphasia: Secondary | ICD-10-CM | POA: Diagnosis present

## 2015-09-14 DIAGNOSIS — Z0000 Encounter for general adult medical examination without abnormal findings: Secondary | ICD-10-CM | POA: Diagnosis not present

## 2015-09-14 LAB — COMPREHENSIVE METABOLIC PANEL
ALT: 13 U/L — AB (ref 14–54)
AST: 21 U/L (ref 15–41)
Albumin: 3.9 g/dL (ref 3.5–5.0)
Alkaline Phosphatase: 74 U/L (ref 38–126)
Anion gap: 10 (ref 5–15)
BUN: 22 mg/dL — ABNORMAL HIGH (ref 6–20)
CHLORIDE: 106 mmol/L (ref 101–111)
CO2: 22 mmol/L (ref 22–32)
CREATININE: 1.15 mg/dL — AB (ref 0.44–1.00)
Calcium: 9.1 mg/dL (ref 8.9–10.3)
GFR, EST AFRICAN AMERICAN: 49 mL/min — AB (ref >60)
GFR, EST NON AFRICAN AMERICAN: 42 mL/min — AB (ref >60)
Glucose, Bld: 142 mg/dL — ABNORMAL HIGH (ref 65–99)
Potassium: 3.7 mmol/L (ref 3.5–5.1)
Sodium: 138 mmol/L (ref 135–145)
Total Bilirubin: 0.5 mg/dL (ref 0.3–1.2)
Total Protein: 6.8 g/dL (ref 6.5–8.1)

## 2015-09-14 LAB — CBC
HEMATOCRIT: 39.8 % (ref 35.0–47.0)
Hemoglobin: 13.2 g/dL (ref 12.0–16.0)
MCH: 29.6 pg (ref 26.0–34.0)
MCHC: 33.1 g/dL (ref 32.0–36.0)
MCV: 89.4 fL (ref 80.0–100.0)
Platelets: 319 10*3/uL (ref 150–440)
RBC: 4.45 MIL/uL (ref 3.80–5.20)
RDW: 14.3 % (ref 11.5–14.5)
WBC: 6.8 10*3/uL (ref 3.6–11.0)

## 2015-09-14 LAB — DIFFERENTIAL
BASOS PCT: 1 %
Basophils Absolute: 0.1 10*3/uL (ref 0–0.1)
EOS ABS: 0.1 10*3/uL (ref 0–0.7)
Eosinophils Relative: 1 %
Lymphocytes Relative: 28 %
Lymphs Abs: 1.9 10*3/uL (ref 1.0–3.6)
MONO ABS: 0.5 10*3/uL (ref 0.2–0.9)
MONOS PCT: 8 %
Neutro Abs: 4.2 10*3/uL (ref 1.4–6.5)
Neutrophils Relative %: 62 %

## 2015-09-14 LAB — PROTIME-INR
INR: 1.02
Prothrombin Time: 13.6 seconds (ref 11.4–15.0)

## 2015-09-14 LAB — APTT: aPTT: 26 seconds (ref 24–36)

## 2015-09-14 LAB — TROPONIN I

## 2015-09-14 NOTE — ED Notes (Signed)
Pt refused care per MD Mayford KnifeWilliams,  See notes
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
## 2015-09-14 NOTE — ED Notes (Signed)
Pt refuses d/c VS  

## 2015-09-14 NOTE — ED Provider Notes (Signed)
[facility] Emergency Department Provider Note        Time seen: ----------------------------------------- 4:08 PM on 09/14/2015 -----------------------------------------    I have reviewed the triage vital signs and the nursing notes.   HISTORY  Chief Complaint Aphasia and Facial Droop    HPI Kerry Cabrera is a 82 y.o. female who comes from the rehabilitation facility for concerns about facial drooping and slurred speech. Patient's physical therapist brought the patient into the hospital due to some speech disturbance. Patient denies any complaints, states she occasionally has a speech disturbance but otherwise arrives alert and oriented without any neurologic deficit.   Past Medical History  Diagnosis Date  . Depression   . TIA (transient ischemic attack)   . Complication of anesthesia     very confused for days  . Hypothyroidism   . Coronary artery disease   . Carotid artery occlusion   . Hyperlipemia   . Hypertension     h/o not on medication  . Insomnia   . Arthritis   . PUD (peptic ulcer disease)     in distant past    Patient Active Problem List   Diagnosis Date Noted  . S/P total knee arthroplasty 08/08/2015    Past Surgical History  Procedure Laterality Date  . Total hip arthroplasty Right 02/06/2014    Dr. Deeann Saint  . Knee arthroscopy    . Tonsillectomy    . Cardiac catheterization    . Percutaneous pinning of right femoral neck fracture    . Knee arthroplasty Left 08/08/2015    Procedure: COMPUTER ASSISTED TOTAL KNEE ARTHROPLASTY;  Surgeon: Donato Heinz, MD;  Location: [facility] ORS;  Service: Orthopedics;  Laterality: Left;    Allergies Ace inhibitors; Codeine; Gabapentin; Micardis hct; Prednisone; Sulfa antibiotics; and Zithromax  Social History Social History  Substance Use Topics  . Smoking status: Former Smoker -- 12 years  . Smokeless tobacco: Never Used  . Alcohol Use: Yes     Comment: osccasional     Review of Systems Constitutional: Negative for fever. Eyes: Negative for visual changes. ENT: Negative for sore throat. Cardiovascular: Negative for chest pain. Respiratory: Negative for shortness of breath. Gastrointestinal: Negative for abdominal pain, vomiting and diarrhea. Genitourinary: Negative for dysuria. Musculoskeletal: Negative for back pain. Skin: Negative for rash. Neurological: Negative for headaches, focal weakness or numbness.   ____________________________________________   PHYSICAL EXAM:  VITAL SIGNS: ED Triage Vitals  Enc Vitals Group     BP 09/14/15 1535 115/53 mmHg     Pulse Rate 09/14/15 1535 96     Resp 09/14/15 1535 18     Temp 09/14/15 1535 98.7 F (37.1 C)     Temp Source 09/14/15 1535 Oral     SpO2 09/14/15 1535 97 %     Weight 09/14/15 1535 160 lb (72.576 kg)     Height 09/14/15 1535 5' 6.5" (1.689 m)     Head Cir --      Peak Flow --      Pain Score 09/14/15 1536 4     Pain Loc --      Pain Edu? --      Excl. in GC? --     Constitutional: Alert and oriented. Well appearing and in no distress. Eyes: Conjunctivae are normal. PERRL. Normal extraocular movements. ENT   Head: Normocephalic and atraumatic.   Nose: No congestion/rhinnorhea.   Mouth/Throat: Mucous membranes are moist.   Neck: No stridor. Cardiovascular: Normal rate, regular rhythm. No murmurs,  rubs, or gallops. Respiratory: Normal respiratory effort without tachypnea nor retractions. Breath sounds are clear and equal bilaterally. No wheezes/rales/rhonchi. Gastrointestinal: Soft and nontender. Normal bowel sounds Musculoskeletal: Nontender with normal range of motion in all extremities. No lower extremity tenderness nor edema. Neurologic:  Normal speech and language. No gross focal neurologic deficits are appreciated. Normal strength, cranial nerves appear to be intact, cerebellar function is normal Skin:  Skin is warm, dry and intact. No rash  noted. Psychiatric: Mood and affect are normal. Speech and behavior are normal.  ____________________________________________  EKG: Interpreted by me.Normal sinus rhythm rate of 95 bpm, normal PR interval, normal QRS, normal QT interval. Normal axis. Nonspecific ST and T-wave changes.  ____________________________________________  ED COURSE:  Pertinent labs & imaging results that were available during my care of the patient were reviewed by me and considered in my medical decision making (see chart for details). Patient is no acute distress, she has no neurologic deficits or symptoms. Patient is refuting everything that was reported to about her earlier. She appears to be alert and oriented and is refusing care at this time. She is stable for outpatient follow-up with her doctor.  ____________________________________________  FINAL ASSESSMENT AND PLAN  Medical screening exam  Plan: Patient presents after concerns about her speaking ability or facial droop. She has no facial droop at this time. She does not have any speech deficit. Do not think this is a TIA. Patient states occasionally she has trouble finding her words but this resolves. Again she denies any complaints and does not want any treatment. She is stable for outpatient follow-up with her doctor.   Emily FilbertWilliams, Jonathan E, MD   Note: This dictation was prepared with Dragon dictation. Any transcriptional errors that result from this process are unintentional   Emily FilbertJonathan E Williams, MD 09/14/15 [phone]

## 2015-09-14 NOTE — Discharge Instructions (Signed)
Medical Screening Exam °A medical screening exam has been done. This exam helps find the cause of your problem and determines whether you need emergency treatment. Your exam has shown that you do not need emergency treatment at this point. It is safe for you to go to your caregiver's office or clinic for treatment. You should make an appointment today to see your caregiver as soon as he or she is available. °Depending on your illness, your symptoms and condition can change over time. If your condition gets worse or you develop new or troubling symptoms before you see your caregiver, you should return to the emergency department for further evaluation.  °  °This information is not intended to replace advice given to you by your health care provider. Make sure you discuss any questions you have with your health care provider. °  °Document Released: 05/03/2004 Document Revised: 04/16/2014 Document Reviewed: 12/13/2010 °Elsevier Interactive Patient Education ©2016 [employer] ° °

## 2015-10-27 ENCOUNTER — Encounter: Payer: Self-pay | Admitting: Podiatry

## 2015-10-27 ENCOUNTER — Ambulatory Visit (INDEPENDENT_AMBULATORY_CARE_PROVIDER_SITE_OTHER): Payer: Medicare Other | Admitting: Podiatry

## 2015-10-27 DIAGNOSIS — M79676 Pain in unspecified toe(s): Secondary | ICD-10-CM | POA: Diagnosis not present

## 2015-10-27 DIAGNOSIS — B351 Tinea unguium: Secondary | ICD-10-CM

## 2015-10-27 DIAGNOSIS — S90415A Abrasion, left lesser toe(s), initial encounter: Secondary | ICD-10-CM | POA: Diagnosis not present

## 2015-10-27 NOTE — Progress Notes (Signed)
Patient ID: Kerry Cabrera, female   DOB: 06/04/1928, 82 y.o.   MRN: [id number]  Subjective: 82 y.o. returns the office today for painful, elongated, thickened toenails which she cannot trim herself. Denies any redness or drainage around the nails. She states that she was in the yard yesterday and she cut her left big toe on the bottom and peeled some skin off. Area did bleed quite a bit but stopped. Denies any other injury. Denies any acute changes since last appointment and no new complaints today. Denies any systemic complaints such as fevers, chills, nausea, vomiting.   Objective: AAO 3, NAD DP/PT pulses palpable, CRT less than 3 seconds Nails hypertrophic, dystrophic, elongated, brittle, discolored 10. There is tenderness overlying the nails 1-5 bilaterally. There is no surrounding erythema or drainage along the nail sites. On the plantar aspect of the left hallux appears to be a superficial abrasion. There is no clinical signs of infection or erythema, ascending synovitis. No open lesions or pre-ulcerative lesions are identified. No other areas of tenderness bilateral lower extremities. No overlying edema, erythema, increased warmth. No pain with calf compression, swelling, warmth, erythema.  Assessment: Patient presents with symptomatic onychomycosis; left hallux abrasion  Plan: -Treatment options including alternatives, risks, complications were discussed -Nails sharply debrided 10 without complication/bleeding. -She would like to have bilateral second digit toenails removed however after discussing the post procedure instruction she wishes or if. -Antibiotic ointment and a bandage the abrasion. Nonhealed the next couple call the office or sooner if any issues are to arise. -Discussed daily foot inspection. If there are any changes, to call the office immediately.  -Follow-up in 3 months or sooner if any problems are to arise. In the meantime, encouraged to call the office with any  questions, concerns, changes symptoms.  Ovid CurdMatthew Wagoner, DPM

## 2016-02-09 ENCOUNTER — Other Ambulatory Visit: Payer: Self-pay | Admitting: Nurse Practitioner

## 2016-02-09 DIAGNOSIS — R198 Other specified symptoms and signs involving the digestive system and abdomen: Secondary | ICD-10-CM

## 2016-02-09 DIAGNOSIS — R194 Change in bowel habit: Secondary | ICD-10-CM

## 2016-02-16 ENCOUNTER — Ambulatory Visit
Admission: RE | Admit: 2016-02-16 | Discharge: 2016-02-16 | Disposition: A | Discharge status: Home / Self Care | Payer: Medicare Other | Source: Ambulatory Visit | Attending: Nurse Practitioner | Admitting: Nurse Practitioner

## 2016-02-16 DIAGNOSIS — K449 Diaphragmatic hernia without obstruction or gangrene: Secondary | ICD-10-CM | POA: Insufficient documentation

## 2016-02-16 DIAGNOSIS — K5730 Diverticulosis of large intestine without perforation or abscess without bleeding: Secondary | ICD-10-CM | POA: Insufficient documentation

## 2016-02-16 DIAGNOSIS — I700 Atherosclerosis of aorta: Secondary | ICD-10-CM | POA: Diagnosis not present

## 2016-02-16 DIAGNOSIS — R198 Other specified symptoms and signs involving the digestive system and abdomen: Secondary | ICD-10-CM

## 2016-02-16 DIAGNOSIS — K8020 Calculus of gallbladder without cholecystitis without obstruction: Secondary | ICD-10-CM | POA: Insufficient documentation

## 2016-02-16 DIAGNOSIS — R194 Change in bowel habit: Secondary | ICD-10-CM | POA: Insufficient documentation

## 2016-02-16 DIAGNOSIS — Q453 Other congenital malformations of pancreas and pancreatic duct: Secondary | ICD-10-CM | POA: Insufficient documentation

## 2016-02-16 MED ORDER — IOPAMIDOL (ISOVUE-300) INJECTION 61%
100.0000 mL | Freq: Once | INTRAVENOUS | Status: AC | PRN
  Administered 2016-02-16: 100 mL via INTRAVENOUS

## 2016-05-21 ENCOUNTER — Ambulatory Visit (INDEPENDENT_AMBULATORY_CARE_PROVIDER_SITE_OTHER): Payer: Medicare Other | Admitting: Podiatry

## 2016-05-21 ENCOUNTER — Encounter: Payer: Self-pay | Admitting: Podiatry

## 2016-05-21 DIAGNOSIS — B351 Tinea unguium: Secondary | ICD-10-CM | POA: Diagnosis not present

## 2016-05-21 DIAGNOSIS — M79676 Pain in unspecified toe(s): Secondary | ICD-10-CM | POA: Diagnosis not present

## 2016-05-21 NOTE — Progress Notes (Signed)
Complaint:  Visit Type: Patient returns to my office for continued preventative foot care services. Complaint: Patient states" my nails have grown long and thick and become painful to walk and wear shoes" . The patient presents for preventative foot care services. No changes to ROS  Podiatric Exam: Vascular: dorsalis pedis and posterior tibial pulses are palpable bilateral. Capillary return is immediate. Temperature gradient is WNL. Skin turgor WNL  Sensorium: Normal Semmes Weinstein monofilament test. Normal tactile sensation bilaterally. Nail Exam: Pt has thick disfigured discolored nails with subungual debris noted bilateral entire nail hallux through fifth toenails Ulcer Exam: There is no evidence of ulcer or pre-ulcerative changes or infection. Orthopedic Exam: Muscle tone and strength are WNL. No limitations in general ROM. No crepitus or effusions noted. Foot type and digits show no abnormalities. Bony prominences are unremarkable. Skin: No Porokeratosis. No infection or ulcers  Diagnosis:  Onychomycosis, , Pain in right toe, pain in left toes  Treatment & Plan Procedures and Treatment: Consent by patient was obtained for treatment procedures. The patient understood the discussion of treatment and procedures well. All questions were answered thoroughly reviewed. Debridement of mycotic and hypertrophic toenails, 1 through 5 bilateral and clearing of subungual debris. No ulceration, no infection noted.  Return Visit-Office Procedure: Patient instructed to return to the office for a follow up visit 3 months   for continued evaluation and treatment.    Biruk Troia DPM 

## 2016-06-18 ENCOUNTER — Ambulatory Visit: Payer: Self-pay | Admitting: Urology

## 2016-06-25 ENCOUNTER — Ambulatory Visit (INDEPENDENT_AMBULATORY_CARE_PROVIDER_SITE_OTHER): Payer: Medicare Other | Admitting: Urology

## 2016-06-25 ENCOUNTER — Encounter: Payer: Self-pay | Admitting: Urology

## 2016-06-25 VITALS — BP 107/68 | HR 86 | Ht 66.0 in | Wt 157.5 lb

## 2016-06-25 DIAGNOSIS — R350 Frequency of micturition: Secondary | ICD-10-CM | POA: Diagnosis not present

## 2016-06-25 DIAGNOSIS — N3941 Urge incontinence: Secondary | ICD-10-CM

## 2016-06-25 LAB — MICROSCOPIC EXAMINATION
Mucus, UA: NONE SEEN
RBC, UA: NONE SEEN /hpf (ref 0–?)
WBC, UA: NONE SEEN /hpf (ref 0–?)

## 2016-06-25 LAB — URINALYSIS, COMPLETE
BILIRUBIN UA: NEGATIVE
GLUCOSE, UA: NEGATIVE
Ketones, UA: NEGATIVE
Leukocytes, UA: NEGATIVE
Nitrite, UA: NEGATIVE
RBC UA: NEGATIVE
Specific Gravity, UA: 1.02 (ref 1.005–1.030)
Urobilinogen, Ur: 0.2 mg/dL (ref 0.2–1.0)
pH, UA: 6.5 (ref 5.0–7.5)

## 2016-06-25 MED ORDER — SOLIFENACIN SUCCINATE 5 MG PO TABS: 5.0000 mg | ORAL_TABLET | Freq: Every day | ORAL | 11 refills | Status: DC

## 2016-06-25 NOTE — Progress Notes (Signed)
06/25/2016 1:37 PM   Kerry Cabrera 1928-07-26 [id number]  Referring provider: Danella Penton, MD [phone] [facility] [address] [facility] [place], [place] [address]  Chief Complaint  Patient presents with  . New Patient (Initial Visit)  . Urinary Frequency    HPI: I was consulted to assess the patient's worsening urgency incontinence over many months. When she goes from a sitting to standing position and I believe she also has foot on the floor syndrome. She denies bedwetting and stress incontinence. He wears 5 or 6 pads a day that are damp  She voids frequently and cannot hold urination for 2 hours. She gets up once at night  She recently had fecal incontinence that normalized after 2-3 months  She denies a history of kidney stones previous GU surgery and bladder infections. She has no neurologic issues. She has not had a hysterectomy. The presentation has not been medically treated  Modifying factors: There are no other modifying factors  Associated signs and symptoms: There are no other associated signs and symptoms Aggravating and relieving factors: There are no other aggravating or relieving factors Severity: Moderate Duration: Persistent     PMH: Past Medical History:  Diagnosis Date  . Arthritis   . Carotid artery occlusion   . Complication of anesthesia    very confused for days  . Coronary artery disease   . Depression   . Glaucoma   . Hyperlipemia   . Hypertension    h/o not on medication  . Hypothyroidism   . Insomnia   . PUD (peptic ulcer disease)    in distant past  . TIA (transient ischemic attack)     Surgical History: Past Surgical History:  Procedure Laterality Date  . CARDIAC CATHETERIZATION    . KNEE ARTHROPLASTY Left 08/08/2015   Procedure: COMPUTER ASSISTED TOTAL KNEE ARTHROPLASTY;  Surgeon: Donato Heinz, MD;  Location: [facility] ORS;  Service: Orthopedics;  Laterality: Left;  . KNEE ARTHROSCOPY    . Percutaneous pinning of  right femoral neck fracture    . TONSILLECTOMY    . TOTAL HIP ARTHROPLASTY Right 02/06/2014   Dr. Deeann Saint    Home Medications:  Allergies as of 06/25/2016      Reactions   Ace Inhibitors Cough   Codeine Nausea And Vomiting, Nausea Only   Gabapentin Other (See Comments)   unsure   Micardis Hct [telmisartan-hctz]    unsure   Prednisone Other (See Comments)   Pt has received cortisone injection for knee with out problems. Pt states she will not take pills for extended time due to feeling lethargic and weight gain. Weight gain   Sulfa Antibiotics Other (See Comments), Rash   Fever 106 degrees,  sent to ER High fever   Zithromax [azithromycin] Other (See Comments), Rash   unsure      Medication List       Accurate as of 06/25/16  1:37 PM. Always use your most recent med list.          buPROPion 300 MG 24 hr tablet Commonly known as:  WELLBUTRIN XL Take 300 mg by mouth daily.   EYE HEALTH FORMULA PO Take 1 capsule by mouth daily.   flurazepam 30 MG capsule Commonly known as:  DALMANE Take 30 mg by mouth daily.   levothyroxine 112 MCG tablet Commonly known as:  SYNTHROID, LEVOTHROID Take 112 mcg by mouth daily before breakfast.   metoprolol succinate 25 MG 24 hr tablet Commonly known as:  TOPROL-XL  Take 25 mg by mouth daily.   MOVANTIK 25 MG Tabs tablet Generic drug:  naloxegol oxalate Take 25 mg by mouth daily after breakfast.   multivitamin tablet Take 1 tablet by mouth daily.   OSTEO BI-FLEX TRIPLE STRENGTH Tabs Take 1 tablet by mouth daily.   oxyCODONE 5 MG immediate release tablet Commonly known as:  Oxy IR/ROXICODONE Take 1-2 tablets (5-10 mg total) by mouth every 3 (three) hours as needed for severe pain or breakthrough pain.   polyethylene glycol powder powder Commonly known as:  GLYCOLAX/MIRALAX Take 1 Container by mouth once. 4 tablespoons at night   senna 8.6 MG tablet Commonly known as:  SENOKOT Take 1 tablet by mouth daily. Reported on  08/08/2015   traMADol 50 MG tablet Commonly known as:  ULTRAM Take 1-2 tablets (50-100 mg total) by mouth every 4 (four) hours as needed for moderate pain.   VITAMIN B 12 PO Take 1,000 mcg by mouth daily.       Allergies:  Allergies  Allergen Reactions  . Ace Inhibitors Cough  . Codeine Nausea And Vomiting and Nausea Only  . Gabapentin Other (See Comments)    unsure  . Micardis Hct [Telmisartan-Hctz]     unsure  . Prednisone Other (See Comments)    Pt has received cortisone injection for knee with out problems. Pt states she will not take pills for extended time due to feeling lethargic and weight gain. Weight gain  . Sulfa Antibiotics Other (See Comments) and Rash    Fever 106 degrees,  sent to ER High fever  . Zithromax [Azithromycin] Other (See Comments) and Rash    unsure    Family History: Family History  Problem Relation Age of Onset  . Prostate cancer Son   . Kidney disease Neg Hx   . Kidney cancer Neg Hx     Social History:  reports that she has quit smoking. She quit after 12.00 years of use. She has never used smokeless tobacco. She reports that she drinks alcohol. She reports that she does not use drugs.  ROS: UROLOGY Frequent Urination?: Yes Hard to postpone urination?: Yes Burning/pain with urination?: No Get up at night to urinate?: Yes Leakage of urine?: Yes Urine stream starts and stops?: No Trouble starting stream?: No Do you have to strain to urinate?: No Blood in urine?: No Urinary tract infection?: No Sexually transmitted disease?: No Injury to kidneys or bladder?: No Painful intercourse?: No Weak stream?: No Currently pregnant?: No Vaginal bleeding?: No Last menstrual period?: 1950  Gastrointestinal Nausea?: No Vomiting?: No Indigestion/heartburn?: No Diarrhea?: Yes Constipation?: Yes  Constitutional Fever: No Night sweats?: No Weight loss?: No Fatigue?: No  Skin Skin rash/lesions?: No Itching?: No  Eyes Blurred  vision?: Yes Double vision?: Yes  Ears/Nose/Throat Sore throat?: No Sinus problems?: No  Hematologic/Lymphatic Swollen glands?: No Easy bruising?: No  Cardiovascular Leg swelling?: No Chest pain?: No  Respiratory Cough?: No Shortness of breath?: No  Endocrine Excessive thirst?: No  Musculoskeletal Back pain?: No Joint pain?: Yes  Neurological Headaches?: No Dizziness?: No  Psychologic Depression?: Yes Anxiety?: Yes  Physical Exam: BP 107/68   Pulse 86   Ht 5\' 6"  (1.676 m)   Wt 71.4 kg (157 lb 8 oz)   BMI 25.42 kg/m   Constitutional:  Alert and oriented, No acute distress. HEENT: [place] AT, moist mucus membranes.  Trachea midline, no masses. Cardiovascular: No clubbing, cyanosis, or edema. Respiratory: Normal respiratory effort, no increased work of breathing. GI: Abdomen is soft,  nontender, nondistended, no abdominal masses GU: On pelvic examination the patient had mild hypermobility of the bladder neck and no stress incontinence. She had a grade 2 distal rectocele Skin: No rashes, bruises or suspicious lesions. Lymph: No cervical or inguinal adenopathy. Neurologic: Grossly intact, no focal deficits, moving all 4 extremities. Psychiatric: Normal mood and affect.  Laboratory Data: Lab Results  Component Value Date   WBC 6.8 09/14/2015   HGB 13.2 09/14/2015   HCT 39.8 09/14/2015   MCV 89.4 09/14/2015   PLT 319 09/14/2015    Lab Results  Component Value Date   CREATININE 1.15 (H) 09/14/2015    No results found for: PSA  No results found for: TESTOSTERONE  No results found for: HGBA1C  Urinalysis    Component Value Date/Time   COLORURINE YELLOW (A) 08/13/2015 0210   APPEARANCEUR CLEAR (A) 08/13/2015 0210   APPEARANCEUR Cloudy 08/27/2013 0110   LABSPEC 1.013 08/13/2015 0210   LABSPEC 1.008 08/27/2013 0110   PHURINE 6.0 08/13/2015 0210   GLUCOSEU NEGATIVE 08/13/2015 0210   GLUCOSEU Negative 08/27/2013 0110   HGBUR NEGATIVE 08/13/2015 0210    BILIRUBINUR NEGATIVE 08/13/2015 0210   BILIRUBINUR Negative 08/27/2013 0110   KETONESUR NEGATIVE 08/13/2015 0210   PROTEINUR NEGATIVE 08/13/2015 0210   NITRITE NEGATIVE 08/13/2015 0210   LEUKOCYTESUR NEGATIVE 08/13/2015 0210   LEUKOCYTESUR Negative 08/27/2013 0110    Pertinent Imaging: non  Assessment & Plan:  The patient primarily has urgency incontinence, mild nighttime frequency and significant daytime urinary frequency. He was catheterized today and urine was sent for culture to rule out a bladder infection perhaps precipitated by her fecal incontinence  The patient was started on Vesicare 5 mg samples and prescription. I will reevaluate her in 4-6 weeks. I will call if the urine culture is positive   1. Frequency of micturition 2. Urgency incontinence     - Urinalysis, Complete   No Follow-up on file.  Martina Sinner, MD  [facility] [address] [place], [place] [address] [phone]

## 2016-06-27 LAB — CULTURE, URINE COMPREHENSIVE

## 2016-07-04 DIAGNOSIS — E039 Hypothyroidism, unspecified: Secondary | ICD-10-CM | POA: Insufficient documentation

## 2016-07-31 ENCOUNTER — Ambulatory Visit: Payer: Medicare Other | Admitting: Urology

## 2016-07-31 ENCOUNTER — Encounter: Payer: Self-pay | Admitting: Urology

## 2016-09-10 ENCOUNTER — Ambulatory Visit (INDEPENDENT_AMBULATORY_CARE_PROVIDER_SITE_OTHER): Payer: Medicare Other | Admitting: Podiatry

## 2016-09-10 ENCOUNTER — Encounter: Payer: Self-pay | Admitting: Podiatry

## 2016-09-10 DIAGNOSIS — B351 Tinea unguium: Secondary | ICD-10-CM

## 2016-09-10 DIAGNOSIS — M79676 Pain in unspecified toe(s): Secondary | ICD-10-CM

## 2016-09-10 NOTE — Progress Notes (Signed)
Complaint:  Visit Type: Patient returns to my office for continued preventative foot care services. Complaint: Patient states" my nails have grown long and thick and become painful to walk and wear shoes" . The patient presents for preventative foot care services. No changes to ROS  Podiatric Exam: Vascular: dorsalis pedis and posterior tibial pulses are palpable bilateral. Capillary return is immediate. Temperature gradient is WNL. Skin turgor WNL  Sensorium: Normal Semmes Weinstein monofilament test. Normal tactile sensation bilaterally. Nail Exam: Pt has thick disfigured discolored nails with subungual debris noted bilateral entire nail hallux through fifth toenails Ulcer Exam: There is no evidence of ulcer or pre-ulcerative changes or infection. Orthopedic Exam: Muscle tone and strength are WNL. No limitations in general ROM. No crepitus or effusions noted. Foot type and digits show no abnormalities. Bony prominences are unremarkable. Skin: No Porokeratosis. No infection or ulcers  Diagnosis:  Onychomycosis, , Pain in right toe, pain in left toes  Treatment & Plan Procedures and Treatment: Consent by patient was obtained for treatment procedures. The patient understood the discussion of treatment and procedures well. All questions were answered thoroughly reviewed. Debridement of mycotic and hypertrophic toenails, 1 through 5 bilateral and clearing of subungual debris. No ulceration, no infection noted.  Return Visit-Office Procedure: Patient instructed to return to the office for a follow up visit 3 months   for continued evaluation and treatment.    Edmond Ginsberg DPM 

## 2016-09-12 DIAGNOSIS — K449 Diaphragmatic hernia without obstruction or gangrene: Secondary | ICD-10-CM | POA: Insufficient documentation

## 2016-09-20 DIAGNOSIS — R197 Diarrhea, unspecified: Secondary | ICD-10-CM | POA: Insufficient documentation

## 2016-10-01 ENCOUNTER — Ambulatory Visit: Payer: Medicare Other

## 2016-10-03 ENCOUNTER — Ambulatory Visit: Payer: Medicare Other

## 2016-10-08 ENCOUNTER — Ambulatory Visit: Payer: Medicare Other

## 2016-10-11 ENCOUNTER — Ambulatory Visit: Payer: Medicare Other

## 2016-10-15 ENCOUNTER — Ambulatory Visit: Payer: Medicare Other

## 2016-10-18 ENCOUNTER — Other Ambulatory Visit
Admission: RE | Admit: 2016-10-18 | Discharge: 2016-10-18 | Disposition: A | Discharge status: Home / Self Care | Payer: Medicare Other | Source: Ambulatory Visit | Attending: Nurse Practitioner | Admitting: Nurse Practitioner

## 2016-10-18 DIAGNOSIS — K529 Noninfective gastroenteritis and colitis, unspecified: Secondary | ICD-10-CM | POA: Insufficient documentation

## 2016-10-18 LAB — GASTROINTESTINAL PANEL BY PCR, STOOL (REPLACES STOOL CULTURE)

## 2016-10-18 LAB — C DIFFICILE QUICK SCREEN W PCR REFLEX
C Diff antigen: NEGATIVE
C Diff interpretation: NOT DETECTED
C Diff toxin: NEGATIVE

## 2016-10-23 ENCOUNTER — Ambulatory Visit: Payer: Medicare Other | Attending: Orthopedic Surgery

## 2016-10-23 DIAGNOSIS — M6281 Muscle weakness (generalized): Secondary | ICD-10-CM | POA: Insufficient documentation

## 2016-10-23 DIAGNOSIS — R262 Difficulty in walking, not elsewhere classified: Secondary | ICD-10-CM | POA: Insufficient documentation

## 2016-10-23 DIAGNOSIS — M25551 Pain in right hip: Secondary | ICD-10-CM | POA: Insufficient documentation
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
## 2016-10-30 ENCOUNTER — Ambulatory Visit: Payer: Medicare Other

## 2016-10-30 DIAGNOSIS — M6281 Muscle weakness (generalized): Secondary | ICD-10-CM | POA: Diagnosis not present

## 2016-10-30 DIAGNOSIS — R262 Difficulty in walking, not elsewhere classified: Secondary | ICD-10-CM
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
## 2016-11-01 ENCOUNTER — Ambulatory Visit: Payer: Medicare Other

## 2016-11-01 DIAGNOSIS — M25551 Pain in right hip: Secondary | ICD-10-CM

## 2016-11-01 DIAGNOSIS — M6281 Muscle weakness (generalized): Secondary | ICD-10-CM | POA: Diagnosis not present

## 2016-11-01 DIAGNOSIS — R262 Difficulty in walking, not elsewhere classified: Secondary | ICD-10-CM
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
## 2016-11-05 ENCOUNTER — Ambulatory Visit: Payer: Medicare Other

## 2016-11-07 ENCOUNTER — Ambulatory Visit: Payer: Medicare Other | Attending: Internal Medicine

## 2016-11-07 DIAGNOSIS — M6281 Muscle weakness (generalized): Secondary | ICD-10-CM | POA: Insufficient documentation

## 2016-11-07 DIAGNOSIS — R262 Difficulty in walking, not elsewhere classified: Secondary | ICD-10-CM | POA: Diagnosis present

## 2016-11-07 DIAGNOSIS — M25551 Pain in right hip: Secondary | ICD-10-CM | POA: Insufficient documentation
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
## 2016-11-12 ENCOUNTER — Ambulatory Visit: Payer: Medicare Other

## 2016-11-14 ENCOUNTER — Ambulatory Visit: Payer: Medicare Other

## 2016-11-19 ENCOUNTER — Ambulatory Visit: Payer: Medicare Other

## 2016-11-19 DIAGNOSIS — M6281 Muscle weakness (generalized): Secondary | ICD-10-CM | POA: Diagnosis not present

## 2016-11-19 DIAGNOSIS — R262 Difficulty in walking, not elsewhere classified: Secondary | ICD-10-CM
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
## 2016-11-22 ENCOUNTER — Ambulatory Visit: Payer: Medicare Other

## 2016-11-22 DIAGNOSIS — R262 Difficulty in walking, not elsewhere classified: Secondary | ICD-10-CM

## 2016-11-22 DIAGNOSIS — M25551 Pain in right hip: Secondary | ICD-10-CM

## 2016-11-22 DIAGNOSIS — M6281 Muscle weakness (generalized): Secondary | ICD-10-CM
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
## 2016-11-27 ENCOUNTER — Ambulatory Visit: Payer: Medicare Other

## 2016-11-27 DIAGNOSIS — M6281 Muscle weakness (generalized): Secondary | ICD-10-CM

## 2016-11-27 DIAGNOSIS — R262 Difficulty in walking, not elsewhere classified: Secondary | ICD-10-CM

## 2016-11-27 DIAGNOSIS — M25551 Pain in right hip: Secondary | ICD-10-CM
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
## 2016-12-17 ENCOUNTER — Encounter: Payer: Self-pay | Admitting: Podiatry

## 2016-12-17 ENCOUNTER — Ambulatory Visit (INDEPENDENT_AMBULATORY_CARE_PROVIDER_SITE_OTHER): Payer: Medicare Other | Admitting: Podiatry

## 2016-12-17 DIAGNOSIS — M79675 Pain in left toe(s): Secondary | ICD-10-CM

## 2016-12-17 DIAGNOSIS — M79674 Pain in right toe(s): Secondary | ICD-10-CM

## 2016-12-17 DIAGNOSIS — M87059 Idiopathic aseptic necrosis of unspecified femur: Secondary | ICD-10-CM | POA: Insufficient documentation

## 2016-12-17 DIAGNOSIS — M25559 Pain in unspecified hip: Secondary | ICD-10-CM | POA: Insufficient documentation

## 2016-12-17 DIAGNOSIS — S72019A Unspecified intracapsular fracture of unspecified femur, initial encounter for closed fracture: Secondary | ICD-10-CM | POA: Insufficient documentation

## 2016-12-17 DIAGNOSIS — I2510 Atherosclerotic heart disease of native coronary artery without angina pectoris: Secondary | ICD-10-CM | POA: Insufficient documentation

## 2016-12-17 DIAGNOSIS — M1992 Post-traumatic osteoarthritis, unspecified site: Secondary | ICD-10-CM | POA: Insufficient documentation

## 2016-12-17 DIAGNOSIS — M169 Osteoarthritis of hip, unspecified: Secondary | ICD-10-CM | POA: Insufficient documentation

## 2016-12-17 DIAGNOSIS — B351 Tinea unguium: Secondary | ICD-10-CM | POA: Diagnosis not present

## 2016-12-17 DIAGNOSIS — R269 Unspecified abnormalities of gait and mobility: Secondary | ICD-10-CM | POA: Insufficient documentation

## 2016-12-17 DIAGNOSIS — M461 Sacroiliitis, not elsewhere classified: Secondary | ICD-10-CM | POA: Insufficient documentation

## 2016-12-17 DIAGNOSIS — M1710 Unilateral primary osteoarthritis, unspecified knee: Secondary | ICD-10-CM | POA: Insufficient documentation

## 2016-12-17 DIAGNOSIS — M7070 Other bursitis of hip, unspecified hip: Secondary | ICD-10-CM | POA: Insufficient documentation

## 2016-12-17 DIAGNOSIS — M179 Osteoarthritis of knee, unspecified: Secondary | ICD-10-CM | POA: Insufficient documentation

## 2016-12-17 DIAGNOSIS — S72033A Displaced midcervical fracture of unspecified femur, initial encounter for closed fracture: Secondary | ICD-10-CM | POA: Insufficient documentation

## 2016-12-17 NOTE — Progress Notes (Signed)
Complaint:  Visit Type: Patient returns to my office for continued preventative foot care services. Complaint: Patient states" my nails have grown long and thick and become painful to walk and wear shoes"  The patient presents for preventative foot care services. No changes to ROS  Podiatric Exam: Vascular: dorsalis pedis and posterior tibial pulses are palpable bilateral. Capillary return is immediate. Temperature gradient is WNL. Skin turgor WNL  Sensorium: Normal Semmes Weinstein monofilament test. Normal tactile sensation bilaterally. Nail Exam: Pt has thick disfigured discolored nails with subungual debris noted bilateral entire nail hallux through fifth toenails Ulcer Exam: There is no evidence of ulcer or pre-ulcerative changes or infection. Orthopedic Exam: Muscle tone and strength are WNL. No limitations in general ROM. No crepitus or effusions noted. Foot type and digits show no abnormalities. Bony prominences are unremarkable. Skin: No Porokeratosis. No infection or ulcers  Diagnosis:  Onychomycosis, , Pain in right toe, pain in left toes  Treatment & Plan Procedures and Treatment: Consent by patient was obtained for treatment procedures. The patient understood the discussion of treatment and procedures well. All questions were answered thoroughly reviewed. Debridement of mycotic and hypertrophic toenails, 1 through 5 bilateral and clearing of subungual debris. No ulceration, no infection noted.  Return Visit-Office Procedure: Patient instructed to return to the office for a follow up visit 10 weeksw for continued evaluation and treatment.    Helane GuntherGregory Perris Tripathi DPM

## 2016-12-31 ENCOUNTER — Ambulatory Visit: Payer: Medicare Other | Admitting: Podiatry

## 2017-03-07 ENCOUNTER — Encounter: Payer: Self-pay | Admitting: Podiatry

## 2017-03-07 ENCOUNTER — Ambulatory Visit (INDEPENDENT_AMBULATORY_CARE_PROVIDER_SITE_OTHER): Payer: Medicare Other | Admitting: Podiatry

## 2017-03-07 DIAGNOSIS — M79675 Pain in left toe(s): Secondary | ICD-10-CM | POA: Diagnosis not present

## 2017-03-07 DIAGNOSIS — M79674 Pain in right toe(s): Secondary | ICD-10-CM

## 2017-03-07 DIAGNOSIS — B351 Tinea unguium: Secondary | ICD-10-CM | POA: Diagnosis not present

## 2017-03-07 NOTE — Progress Notes (Signed)
Complaint:  Visit Type: Patient returns to my office for continued preventative foot care services. Complaint: Patient states" my nails have grown long and thick and become painful to walk and wear shoes"  The patient presents for preventative foot care services. No changes to ROS  Podiatric Exam: Vascular: dorsalis pedis and posterior tibial pulses are palpable bilateral. Capillary return is immediate. Temperature gradient is WNL. Skin turgor WNL  Sensorium: Normal Semmes Weinstein monofilament test. Normal tactile sensation bilaterally. Nail Exam: Pt has thick disfigured discolored nails with subungual debris noted bilateral entire nail hallux through fifth toenails Ulcer Exam: There is no evidence of ulcer or pre-ulcerative changes or infection. Orthopedic Exam: Muscle tone and strength are WNL. No limitations in general ROM. No crepitus or effusions noted. Foot type and digits show no abnormalities. Bony prominences are unremarkable. Skin: No Porokeratosis. No infection or ulcers.  Corn second right.  Diagnosis:  Onychomycosis, , Pain in right toe, pain in left toes  Treatment & Plan Procedures and Treatment: Consent by patient was obtained for treatment procedures. The patient understood the discussion of treatment and procedures well. All questions were answered thoroughly reviewed. Debridement of mycotic and hypertrophic toenails, 1 through 5 bilateral and clearing of subungual debris. No ulceration, no infection noted.  Return Visit-Office Procedure: Patient instructed to return to the office for a follow up visit 10 weeks for continued evaluation and treatment.    Helane GuntherGregory Jalen Daluz DPM

## 2017-05-16 ENCOUNTER — Ambulatory Visit (INDEPENDENT_AMBULATORY_CARE_PROVIDER_SITE_OTHER): Payer: Medicare Other | Admitting: Podiatry

## 2017-05-16 ENCOUNTER — Encounter: Payer: Self-pay | Admitting: Podiatry

## 2017-05-16 DIAGNOSIS — B351 Tinea unguium: Secondary | ICD-10-CM

## 2017-05-16 DIAGNOSIS — M79674 Pain in right toe(s): Secondary | ICD-10-CM

## 2017-05-16 DIAGNOSIS — M79675 Pain in left toe(s): Secondary | ICD-10-CM

## 2017-05-16 NOTE — Progress Notes (Signed)
Complaint:  Visit Type: Patient returns to my office for continued preventative foot care services. Complaint: Patient states" my nails have grown long and thick and become painful to walk and wear shoes" . The patient presents for preventative foot care services. No changes to ROS  Podiatric Exam: Vascular: dorsalis pedis and posterior tibial pulses are palpable bilateral. Capillary return is immediate. Temperature gradient is WNL. Skin turgor WNL  Sensorium: Normal Semmes Weinstein monofilament test. Normal tactile sensation bilaterally. Nail Exam: Pt has thick disfigured discolored nails with subungual debris noted bilateral entire nail hallux through fifth toenails Ulcer Exam: There is no evidence of ulcer or pre-ulcerative changes or infection. Orthopedic Exam: Muscle tone and strength are WNL. No limitations in general ROM. No crepitus or effusions noted. Foot type and digits show no abnormalities. Bony prominences are unremarkable. Skin: No Porokeratosis. No infection or ulcers  Diagnosis:  Onychomycosis, , Pain in right toe, pain in left toes  Treatment & Plan Procedures and Treatment: Consent by patient was obtained for treatment procedures. The patient understood the discussion of treatment and procedures well. All questions were answered thoroughly reviewed. Debridement of mycotic and hypertrophic toenails, 1 through 5 bilateral and clearing of subungual debris. No ulceration, no infection noted.  Return Visit-Office Procedure: Patient instructed to return to the office for a follow up visit 10 weeks  for continued evaluation and treatment.    Katye Valek DPM 

## 2017-08-15 ENCOUNTER — Ambulatory Visit (INDEPENDENT_AMBULATORY_CARE_PROVIDER_SITE_OTHER): Payer: Medicare Other | Admitting: Podiatry

## 2017-08-15 ENCOUNTER — Encounter: Payer: Self-pay | Admitting: Podiatry

## 2017-08-15 DIAGNOSIS — M79675 Pain in left toe(s): Secondary | ICD-10-CM

## 2017-08-15 DIAGNOSIS — M79674 Pain in right toe(s): Secondary | ICD-10-CM | POA: Diagnosis not present

## 2017-08-15 DIAGNOSIS — B351 Tinea unguium: Secondary | ICD-10-CM | POA: Diagnosis not present

## 2017-08-15 NOTE — Progress Notes (Signed)
Complaint:  Visit Type: Patient returns to my office for continued preventative foot care services. Complaint: Patient states" my nails have grown long and thick and become painful to walk and wear shoes"  The patient presents for preventative foot care services. No changes to ROS.  Patient says she is taking prednisone and her nails are growing long.  Podiatric Exam: Vascular: dorsalis pedis and posterior tibial pulses are palpable bilateral. Capillary return is immediate. Temperature gradient is WNL. Skin turgor WNL  Sensorium: Normal Semmes Weinstein monofilament test. Normal tactile sensation bilaterally. Nail Exam: Pt has thick disfigured discolored nails with subungual debris noted bilateral entire nail hallux through fifth toenails Ulcer Exam: There is no evidence of ulcer or pre-ulcerative changes or infection. Orthopedic Exam: Muscle tone and strength are WNL. No limitations in general ROM. No crepitus or effusions noted. Foot type and digits show no abnormalities. Bony prominences are unremarkable. Skin: No Porokeratosis. No infection or ulcers.    Diagnosis:  Onychomycosis, , Pain in right toe, pain in left toes  Treatment & Plan Procedures and Treatment: Consent by patient was obtained for treatment procedures. The patient understood the discussion of treatment and procedures well. All questions were answered thoroughly reviewed. Debridement of mycotic and hypertrophic toenails, 1 through 5 bilateral and clearing of subungual debris. No ulceration, no infection noted.  Return Visit-Office Procedure: Patient instructed to return to the office for a follow up visit 10 weeks for continued evaluation and treatment.    Helane Gunther DPM

## 2017-10-24 ENCOUNTER — Ambulatory Visit: Payer: Medicare Other | Admitting: Podiatry

## 2017-11-05 ENCOUNTER — Observation Stay
Admission: EM | Admit: 2017-11-05 | Discharge: 2017-11-07 | Disposition: A | Discharge status: Home / Self Care | Payer: Medicare Other | Attending: Internal Medicine | Admitting: Internal Medicine

## 2017-11-05 ENCOUNTER — Other Ambulatory Visit: Payer: Self-pay

## 2017-11-05 ENCOUNTER — Encounter: Payer: Self-pay | Admitting: Emergency Medicine

## 2017-11-05 ENCOUNTER — Emergency Department: Payer: Medicare Other

## 2017-11-05 ENCOUNTER — Observation Stay
Admit: 2017-11-05 | Discharge: 2017-11-05 | Disposition: A | Discharge status: Home / Self Care | Payer: Medicare Other | Attending: Internal Medicine | Admitting: Internal Medicine

## 2017-11-05 DIAGNOSIS — K449 Diaphragmatic hernia without obstruction or gangrene: Secondary | ICD-10-CM | POA: Diagnosis not present

## 2017-11-05 DIAGNOSIS — H409 Unspecified glaucoma: Secondary | ICD-10-CM | POA: Insufficient documentation

## 2017-11-05 DIAGNOSIS — Z7952 Long term (current) use of systemic steroids: Secondary | ICD-10-CM | POA: Diagnosis not present

## 2017-11-05 DIAGNOSIS — E782 Mixed hyperlipidemia: Secondary | ICD-10-CM | POA: Insufficient documentation

## 2017-11-05 DIAGNOSIS — F329 Major depressive disorder, single episode, unspecified: Secondary | ICD-10-CM | POA: Diagnosis not present

## 2017-11-05 DIAGNOSIS — Z87891 Personal history of nicotine dependence: Secondary | ICD-10-CM | POA: Diagnosis not present

## 2017-11-05 DIAGNOSIS — G4700 Insomnia, unspecified: Secondary | ICD-10-CM | POA: Diagnosis not present

## 2017-11-05 DIAGNOSIS — I6529 Occlusion and stenosis of unspecified carotid artery: Secondary | ICD-10-CM | POA: Diagnosis not present

## 2017-11-05 DIAGNOSIS — R001 Bradycardia, unspecified: Secondary | ICD-10-CM | POA: Diagnosis present

## 2017-11-05 DIAGNOSIS — I10 Essential (primary) hypertension: Secondary | ICD-10-CM | POA: Insufficient documentation

## 2017-11-05 DIAGNOSIS — I2510 Atherosclerotic heart disease of native coronary artery without angina pectoris: Secondary | ICD-10-CM | POA: Diagnosis not present

## 2017-11-05 DIAGNOSIS — F419 Anxiety disorder, unspecified: Secondary | ICD-10-CM | POA: Insufficient documentation

## 2017-11-05 DIAGNOSIS — R55 Syncope and collapse: Secondary | ICD-10-CM | POA: Diagnosis present

## 2017-11-05 DIAGNOSIS — Z8673 Personal history of transient ischemic attack (TIA), and cerebral infarction without residual deficits: Secondary | ICD-10-CM | POA: Insufficient documentation

## 2017-11-05 DIAGNOSIS — Z66 Do not resuscitate: Secondary | ICD-10-CM | POA: Insufficient documentation

## 2017-11-05 DIAGNOSIS — E039 Hypothyroidism, unspecified: Secondary | ICD-10-CM | POA: Diagnosis not present

## 2017-11-05 DIAGNOSIS — Z885 Allergy status to narcotic agent status: Secondary | ICD-10-CM | POA: Diagnosis not present

## 2017-11-05 DIAGNOSIS — Z8711 Personal history of peptic ulcer disease: Secondary | ICD-10-CM | POA: Insufficient documentation

## 2017-11-05 DIAGNOSIS — Z882 Allergy status to sulfonamides status: Secondary | ICD-10-CM | POA: Diagnosis not present

## 2017-11-05 DIAGNOSIS — I493 Ventricular premature depolarization: Principal | ICD-10-CM | POA: Insufficient documentation

## 2017-11-05 LAB — BASIC METABOLIC PANEL
Anion gap: 7 (ref 5–15)
BUN: 22 mg/dL (ref 8–23)
CHLORIDE: 101 mmol/L (ref 98–111)
CO2: 28 mmol/L (ref 22–32)
Calcium: 9.6 mg/dL (ref 8.9–10.3)
Creatinine, Ser: 1.06 mg/dL — ABNORMAL HIGH (ref 0.44–1.00)
GFR calc Af Amer: 54 mL/min — ABNORMAL LOW (ref >60)
GFR calc non Af Amer: 46 mL/min — ABNORMAL LOW (ref >60)
Glucose, Bld: 158 mg/dL — ABNORMAL HIGH (ref 70–99)
Potassium: 4.5 mmol/L (ref 3.5–5.1)
SODIUM: 136 mmol/L (ref 135–145)

## 2017-11-05 LAB — CBC
HCT: 43.4 % (ref 35.0–47.0)
Hemoglobin: 14.2 g/dL (ref 12.0–16.0)
MCH: 29.2 pg (ref 26.0–34.0)
MCHC: 32.8 g/dL (ref 32.0–36.0)
MCV: 89.2 fL (ref 80.0–100.0)
PLATELETS: 288 10*3/uL (ref 150–440)
RBC: 4.87 MIL/uL (ref 3.80–5.20)
RDW: 15.2 % — AB (ref 11.5–14.5)
WBC: 6.7 10*3/uL (ref 3.6–11.0)

## 2017-11-05 LAB — MAGNESIUM: MAGNESIUM: 2.6 mg/dL — AB (ref 1.7–2.4)

## 2017-11-05 LAB — GLUCOSE, CAPILLARY: Glucose-Capillary: 137 mg/dL — ABNORMAL HIGH (ref 70–99)

## 2017-11-05 LAB — TROPONIN I: Troponin I: <0.03 ng/mL (ref <0.03)

## 2017-11-05 MED ORDER — ONDANSETRON HCL 4 MG PO TABS: 4.0000 mg | ORAL_TABLET | Freq: Four times a day (QID) | ORAL | Status: DC | PRN

## 2017-11-05 MED ORDER — BISACODYL 5 MG PO TBEC
5.0000 mg | DELAYED_RELEASE_TABLET | Freq: Every day | ORAL | Status: DC | PRN
  Administered 2017-11-06: 5 mg via ORAL
  Filled 2017-11-05: qty 1

## 2017-11-05 MED ORDER — SODIUM CHLORIDE 0.9 % IV BOLUS
500.0000 mL | Freq: Once | INTRAVENOUS | Status: AC
  Administered 2017-11-05: 500 mL via INTRAVENOUS

## 2017-11-05 MED ORDER — SODIUM CHLORIDE 0.9% FLUSH
3.0000 mL | Freq: Two times a day (BID) | INTRAVENOUS | Status: DC
  Administered 2017-11-05 – 2017-11-07 (×4): 3 mL via INTRAVENOUS

## 2017-11-05 MED ORDER — LORAZEPAM 0.5 MG PO TABS
0.5000 mg | ORAL_TABLET | Freq: Every day | ORAL | Status: DC
  Administered 2017-11-05 – 2017-11-06 (×2): 0.5 mg via ORAL
  Filled 2017-11-05 (×2): qty 1

## 2017-11-05 MED ORDER — SODIUM CHLORIDE 0.9 % IV SOLN: 250.0000 mL | INTRAVENOUS | Status: DC | PRN

## 2017-11-05 MED ORDER — BUPROPION HCL ER (XL) 150 MG PO TB24
300.0000 mg | ORAL_TABLET | Freq: Every day | ORAL | Status: DC
  Administered 2017-11-06 – 2017-11-07 (×2): 300 mg via ORAL
  Filled 2017-11-05 (×2): qty 2

## 2017-11-05 MED ORDER — ENOXAPARIN SODIUM 40 MG/0.4ML ~~LOC~~ SOLN
40.0000 mg | SUBCUTANEOUS | Status: DC
  Administered 2017-11-05 – 2017-11-06 (×2): 40 mg via SUBCUTANEOUS
  Filled 2017-11-05 (×2): qty 0.4

## 2017-11-05 MED ORDER — SENNA 8.6 MG PO TABS
1.0000 | ORAL_TABLET | Freq: Every day | ORAL | Status: DC
  Administered 2017-11-06 – 2017-11-07 (×2): 8.6 mg via ORAL
  Filled 2017-11-05 (×2): qty 1

## 2017-11-05 MED ORDER — ALBUTEROL SULFATE (2.5 MG/3ML) 0.083% IN NEBU: 2.5000 mg | INHALATION_SOLUTION | RESPIRATORY_TRACT | Status: DC | PRN

## 2017-11-05 MED ORDER — SODIUM CHLORIDE 0.9% FLUSH: 3.0000 mL | INTRAVENOUS | Status: DC | PRN

## 2017-11-05 MED ORDER — ACETAMINOPHEN 325 MG PO TABS
650.0000 mg | ORAL_TABLET | Freq: Four times a day (QID) | ORAL | Status: DC | PRN
  Administered 2017-11-06: 650 mg via ORAL
  Filled 2017-11-05: qty 2

## 2017-11-05 MED ORDER — DONEPEZIL HCL 5 MG PO TABS
5.0000 mg | ORAL_TABLET | Freq: Every day | ORAL | Status: DC
  Administered 2017-11-05 – 2017-11-06 (×2): 5 mg via ORAL
  Filled 2017-11-05 (×3): qty 1

## 2017-11-05 MED ORDER — ONDANSETRON HCL 4 MG/2ML IJ SOLN: 4.0000 mg | Freq: Four times a day (QID) | INTRAMUSCULAR | Status: DC | PRN

## 2017-11-05 MED ORDER — SENNOSIDES-DOCUSATE SODIUM 8.6-50 MG PO TABS: 1.0000 | ORAL_TABLET | Freq: Every evening | ORAL | Status: DC | PRN

## 2017-11-05 MED ORDER — LEVOTHYROXINE SODIUM 112 MCG PO TABS
112.0000 ug | ORAL_TABLET | Freq: Every day | ORAL | Status: DC
  Administered 2017-11-06 – 2017-11-07 (×2): 112 ug via ORAL
  Filled 2017-11-05 (×2): qty 1

## 2017-11-05 MED ORDER — ACETAMINOPHEN 650 MG RE SUPP: 650.0000 mg | Freq: Four times a day (QID) | RECTAL | Status: DC | PRN

## 2017-11-05 NOTE — ED Provider Notes (Signed)
[facility] Emergency Department Provider Note    First MD Initiated Contact with Patient 11/05/17 1501     (approximate)  I have reviewed the triage vital signs and the nursing notes.   HISTORY  Chief Complaint Loss of Consciousness    HPI Kerry Cabrera is a 84 y.o. female with past medical history as described below presents the ER after having witnessed syncopal episode.  This occurred initially at her primary care physician's office today was witnessed lasting roughly 15 to 20 seconds.  And had 2 other episodes witnessed today.  On arrival to the ER she was witnessed to have a similar episode where she completely lost consciousness and then came back to.  No recent medication changes.  While was evaluated patient was witnessed to have another syncopal episode where she became flaccid unresponsive for roughly 20 seconds she did have very slow weak bradycardic pulse and on the telemetry monitor there was evidence of a narrow complex bradycardia to the low 30s.  Patient was placed in Trendelenburg position and her heart rate returned to normal.  She is denying any chest pain.  No blurry vision.  No headaches.  During 1 of the syncopal episode she did bite her tongue.    Past Medical History:  Diagnosis Date  . Arthritis   . Carotid artery occlusion   . Complication of anesthesia    very confused for days  . Coronary artery disease   . Depression   . Glaucoma   . Hyperlipemia   . Hypertension    h/o not on medication  . Hypothyroidism   . Insomnia   . PUD (peptic ulcer disease)    in distant past  . TIA (transient ischemic attack)    Family History  Problem Relation Age of Onset  . Prostate cancer Son   . Kidney disease Neg Hx   . Kidney cancer Neg Hx    Past Surgical History:  Procedure Laterality Date  . CARDIAC CATHETERIZATION    . KNEE ARTHROPLASTY Left 08/08/2015   Procedure: COMPUTER ASSISTED TOTAL KNEE ARTHROPLASTY;  Surgeon: Donato Heinz, MD;  Location: [facility] ORS;  Service: Orthopedics;  Laterality: Left;  . KNEE ARTHROSCOPY    . Percutaneous pinning of right femoral neck fracture    . TONSILLECTOMY    . TOTAL HIP ARTHROPLASTY Right 02/06/2014   Dr. Deeann Saint   Patient Active Problem List   Diagnosis Date Noted  . Abnormal gait 12/17/2016  . Avascular necrosis of femoral head (HCC) 12/17/2016  . Bursitis of hip 12/17/2016  . CAD (coronary artery disease) 12/17/2016  . Closed fracture of intracapsular section of femur (HCC) 12/17/2016  . Closed transcervical fracture of femur (HCC) 12/17/2016  . Hip pain 12/17/2016  . Inflammation of sacroiliac joint (HCC) 12/17/2016  . Osteoarthritis of hip 12/17/2016  . Osteoarthritis of knee 12/17/2016  . Post-traumatic osteoarthritis 12/17/2016  . Diarrhea 09/20/2016  . Hiatal hernia 09/12/2016  . Acquired hypothyroidism 07/04/2016  . S/P total knee arthroplasty 08/08/2015  . Moderate mitral insufficiency 12/20/2014  . 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  . Hyperlipidemia, mixed 07/09/2013      Prior to Admission medications   Medication Sig Start Date End Date Taking? Authorizing Provider  buPROPion (WELLBUTRIN XL) 300 MG 24 hr tablet Take 300 mg by mouth daily.    [provider]  Cyanocobalamin (VITAMIN B 12 PO) Take 1,000 mcg by mouth daily.    [provider]  DHA-Vitamin C-Lutein (EYE HEALTH  FORMULA PO) Take 1 capsule by mouth daily.    [provider]  flurazepam (DALMANE) 30 MG capsule Take 30 mg by mouth daily.    [provider]  levothyroxine (SYNTHROID, LEVOTHROID) 112 MCG tablet Take 112 mcg by mouth daily before breakfast.    [provider]  metoprolol succinate (TOPROL-XL) 25 MG 24 hr tablet Take 25 mg by mouth daily.    [provider]  Misc Natural Products (OSTEO BI-FLEX TRIPLE STRENGTH) TABS Take 1 tablet by mouth daily.    [provider]  Multiple Vitamin (MULTIVITAMIN) tablet Take 1 tablet  by mouth daily.    [provider]  naloxegol oxalate (MOVANTIK) 25 MG TABS tablet Take 25 mg by mouth daily after breakfast.    [provider]  oxyCODONE (OXY IR/ROXICODONE) 5 MG immediate release tablet Take 1-2 tablets (5-10 mg total) by mouth every 3 (three) hours as needed for severe pain or breakthrough pain. Patient not taking: Reported on 06/25/2016 08/10/15   Tera Partridge, PA  polyethylene glycol powder (GLYCOLAX/MIRALAX) powder Take 1 Container by mouth once. 4 tablespoons at night    [provider]  senna (SENOKOT) 8.6 MG tablet Take 1 tablet by mouth daily. Reported on 08/08/2015    [provider]  solifenacin (VESICARE) 5 MG tablet Take 1 tablet (5 mg total) by mouth daily. 06/25/16   Alfredo Martinez, MD  traMADol (ULTRAM) 50 MG tablet Take 1-2 tablets (50-100 mg total) by mouth every 4 (four) hours as needed for moderate pain. Patient not taking: Reported on 06/25/2016 08/10/15   Tera Partridge, PA    Allergies Ace inhibitors; Codeine; Gabapentin; Micardis hct [telmisartan-hctz]; Prednisone; Sulfa antibiotics; and Zithromax [azithromycin]    Social History Social History   Tobacco Use  . Smoking status: Former Smoker    Years: 12.00  . Smokeless tobacco: Never Used  Substance Use Topics  . Alcohol use: Yes    Comment: osccasional  . Drug use: No    Review of Systems Patient denies headaches, rhinorrhea, blurry vision, numbness, shortness of breath, chest pain, edema, cough, abdominal pain, nausea, vomiting, diarrhea, dysuria, fevers, rashes or hallucinations unless otherwise stated above in HPI. ____________________________________________   PHYSICAL EXAM:  VITAL SIGNS: Vitals:   11/05/17 1416 11/05/17 1456  BP: 111/71 129/70  Pulse: 82 64  Resp: 18 14  Temp: 98.4 F (36.9 C)   SpO2: 95% 95%    Constitutional: Alert and oriented.  Eyes: Conjunctivae are normal.  Head: Atraumatic. Nose: No  congestion/rhinnorhea. Mouth/Throat: Mucous membranes are moist.  Bruising to tongue Neck: No stridor. Painless ROM.  Cardiovascular: Normal rate, regular rhythm. Grossly normal heart sounds.  Good peripheral circulation. Respiratory: Normal respiratory effort.  No retractions. Lungs CTAB. Gastrointestinal: Soft and nontender. No distention. No abdominal bruits. No CVA tenderness. Genitourinary:  Musculoskeletal: No lower extremity tenderness nor edema.  No joint effusions. Neurologic:  Normal speech and language. No gross focal neurologic deficits are appreciated. No facial droop Skin:  Skin is warm, dry and intact. No rash noted. Psychiatric: Mood and affect are normal. Speech and behavior are normal.  ____________________________________________   LABS (all labs ordered are listed, but only abnormal results are displayed)  Results for orders placed or performed during the hospital encounter of 11/05/17 (from the past 24 hour(s))  Basic metabolic panel     Status: Abnormal   Collection Time: 11/05/17  2:24 PM  Result Value Ref Range   Sodium 136 135 - 145 mmol/L  Potassium 4.5 3.5 - 5.1 mmol/L   Chloride 101 98 - 111 mmol/L   CO2 28 22 - 32 mmol/L   Glucose, Bld 158 (H) 70 - 99 mg/dL   BUN 22 8 - 23 mg/dL   Creatinine, Ser 1.611.06 (H) 0.44 - 1.00 mg/dL   Calcium 9.6 8.9 - 09.610.3 mg/dL   GFR calc non Af Amer 46 (L) >60 mL/min   GFR calc Af Amer 54 (L) >60 mL/min   Anion gap 7 5 - 15  CBC     Status: Abnormal   Collection Time: 11/05/17  2:24 PM  Result Value Ref Range   WBC 6.7 3.6 - 11.0 K/uL   RBC 4.87 3.80 - 5.20 MIL/uL   Hemoglobin 14.2 12.0 - 16.0 g/dL   HCT 04.543.4 40.935.0 - 81.147.0 %   MCV 89.2 80.0 - 100.0 fL   MCH 29.2 26.0 - 34.0 pg   MCHC 32.8 32.0 - 36.0 g/dL   RDW 91.415.2 (H) 78.211.5 - 95.614.5 %   Platelets 288 150 - 440 K/uL  Glucose, capillary     Status: Abnormal   Collection Time: 11/05/17  2:39 PM  Result Value Ref Range   Glucose-Capillary 137 (H) 70 - 99 mg/dL    ____________________________________________  EKG My review and personal interpretation at Time: 14:18   Indication: syncope  Rate: 85  Rhythm: sinus Axis: normal Other: normal intervals, non specific st abn, ____________________________________________  RADIOLOGY  I personally reviewed all radiographic images ordered to evaluate for the above acute complaints and reviewed radiology reports and findings.  These findings were personally discussed with the patient.  Please see medical record for radiology report.  ____________________________________________   PROCEDURES  Procedure(s) performed:  Procedures    Critical Care performed: no ____________________________________________   INITIAL IMPRESSION / ASSESSMENT AND PLAN / ED COURSE  Pertinent labs & imaging results that were available during my care of the patient were reviewed by me and considered in my medical decision making (see chart for details).   DDX: Dysrhythmia, lecture light abnormality, anemia, orthostasis, dehydration, medication reaction  Celesta Gentileatti B Sage is a 84 y.o. who presents to the ED with symptoms as described above.  Patient with several witnessed seizure episodes.  Neuro exam is nonfocal.  Upon arrival to the ER she was placed on cardiac pacer pads due to concern for dysrhythmia and placed immediately on telemetry.  Telemetry monitoring did show evidence of symptomatic bradycardia.  Etiology is uncertain at this time.  Blood work is reassuring.  Chest x-ray and CT imaging of the head ordered to evaluate for mass, congestive heart failure consolidation.  All which is reassuring.  Based on her multiple episodes of syncopal events I do believe patient should be admitted to the hospital for further evaluation.   concerning for sick sinus syndrome.  Seems less consistent with medication induced bradycardia she is reverting back to a normal rate spontaneously.      As part of my medical decision making, I  reviewed the following data within the electronic MEDICAL RECORD NUMBER Nursing notes reviewed and incorporated, Labs reviewed, notes from prior ED visits.   ____________________________________________   FINAL CLINICAL IMPRESSION(S) / ED DIAGNOSES  Final diagnoses:  Syncope and collapse  Symptomatic bradycardia      NEW MEDICATIONS STARTED DURING THIS VISIT:  New Prescriptions   No medications on file     Note:  This document was prepared using Dragon voice recognition software and may include unintentional dictation errors.  Willy Eddy, MD 11/05/17 [phone]

## 2017-11-05 NOTE — Progress Notes (Signed)
Patient admitted to unit. Oriented to room, call bell, and staff. Bed in lowest position. Fall safety plan reviewed. Full assessment to Epic. Skin assessment verified with Greer EeSusan Presto RN. Telemetry box verification with tele clerk- Box#: --[id number]---. Will continue to monitor.

## 2017-11-05 NOTE — H&P (Signed)
[facility] - [place] [facility]   PATIENT NAME: Kerry Cabrera    MR#:  [id number]  DATE OF BIRTH:  March 16, 1929  DATE OF ADMISSION:  11/05/2017  PRIMARY CARE PHYSICIAN: Danella Penton, MD   REQUESTING/REFERRING PHYSICIAN: Dr. Roxan Hockey.  CHIEF COMPLAINT:   Chief Complaint  Patient presents with  . Loss of Consciousness   Loss of consciousness 4 times today. HISTORY OF PRESENT ILLNESS:  Kerry Cabrera  is a 83 y.o. female with a known history of hypertension, hyperlipidemia, CAD, hypothyroidism, PUD, carotid arterial occlusion and TIA.  The patient presented to ED with above chief complaints.  She lost consciousness twice in PCPs office today, which lasted about 15 to 20 seconds.  She had a similar episode in the ED.  She said she feels dizzy and weak before syncope episode.  But she denies any chest pain, palpitation or leg edema.  She was found severe bradycardia to the low 30s.  Now she is in sinus rhythm with heart rate at the 60s. Telemetry monitoring did show evidence of symptomatic bradycardia.  PAST MEDICAL HISTORY:   Past Medical History:  Diagnosis Date  . Arthritis   . Carotid artery occlusion   . Complication of anesthesia    very confused for days  . Coronary artery disease   . Depression   . Glaucoma   . Hyperlipemia   . Hypertension    h/o not on medication  . Hypothyroidism   . Insomnia   . PUD (peptic ulcer disease)    in distant past  . TIA (transient ischemic attack)     PAST SURGICAL HISTORY:   Past Surgical History:  Procedure Laterality Date  . CARDIAC CATHETERIZATION    . KNEE ARTHROPLASTY Left 08/08/2015   Procedure: COMPUTER ASSISTED TOTAL KNEE ARTHROPLASTY;  Surgeon: Donato Heinz, MD;  Location: [facility] ORS;  Service: Orthopedics;  Laterality: Left;  . KNEE ARTHROSCOPY    . Percutaneous pinning of right femoral neck fracture    . TONSILLECTOMY    . TOTAL HIP ARTHROPLASTY Right 02/06/2014   Dr. Deeann Saint    SOCIAL  HISTORY:   Social History   Tobacco Use  . Smoking status: Former Smoker    Years: 12.00  . Smokeless tobacco: Never Used  Substance Use Topics  . Alcohol use: Yes    Comment: osccasional    FAMILY HISTORY:   Family History  Problem Relation Age of Onset  . Prostate cancer Son   . Kidney disease Neg Hx   . Kidney cancer Neg Hx     DRUG ALLERGIES:   Allergies  Allergen Reactions  . Ace Inhibitors Cough  . Codeine Nausea And Vomiting and Nausea Only  . Gabapentin Other (See Comments)    unsure  . Micardis Hct [Telmisartan-Hctz]     unsure  . Prednisone Other (See Comments)    Pt has received cortisone injection for knee with out problems. Pt states she will not take pills for extended time due to feeling lethargic and weight gain. Weight gain  . Sulfa Antibiotics Other (See Comments) and Rash    Fever 106 degrees,  sent to ER High fever  . Zithromax [Azithromycin] Other (See Comments) and Rash    unsure    REVIEW OF SYSTEMS:   Review of Systems  Constitutional: Positive for malaise/fatigue. Negative for chills and fever.  HENT: Negative for sore throat.   Eyes: Negative for blurred vision and double vision.  Respiratory: Negative for cough,  hemoptysis, shortness of breath, wheezing and stridor.   Cardiovascular: Negative for chest pain, palpitations, orthopnea and leg swelling.  Gastrointestinal: Negative for abdominal pain, blood in stool, diarrhea, melena, nausea and vomiting.  Genitourinary: Negative for dysuria, flank pain and hematuria.  Musculoskeletal: Negative for back pain and joint pain.  Skin: Negative for rash.  Neurological: Positive for dizziness. Negative for sensory change, focal weakness, seizures, loss of consciousness, weakness and headaches.  Endo/Heme/Allergies: Negative for polydipsia.  Psychiatric/Behavioral: Negative for depression. The patient is not nervous/anxious.     MEDICATIONS AT HOME:   Prior to Admission medications     Medication Sig Start Date End Date Taking? Authorizing Provider  buPROPion (WELLBUTRIN XL) 300 MG 24 hr tablet Take 300 mg by mouth daily.    [provider]  Cyanocobalamin (VITAMIN B 12 PO) Take 1,000 mcg by mouth daily.    [provider]  DHA-Vitamin C-Lutein (EYE HEALTH FORMULA PO) Take 1 capsule by mouth daily.    [provider]  flurazepam (DALMANE) 30 MG capsule Take 30 mg by mouth daily.    [provider]  levothyroxine (SYNTHROID, LEVOTHROID) 112 MCG tablet Take 112 mcg by mouth daily before breakfast.    [provider]  metoprolol succinate (TOPROL-XL) 25 MG 24 hr tablet Take 25 mg by mouth daily.    [provider]  Misc Natural Products (OSTEO BI-FLEX TRIPLE STRENGTH) TABS Take 1 tablet by mouth daily.    [provider]  Multiple Vitamin (MULTIVITAMIN) tablet Take 1 tablet by mouth daily.    [provider]  naloxegol oxalate (MOVANTIK) 25 MG TABS tablet Take 25 mg by mouth daily after breakfast.    [provider]  oxyCODONE (OXY IR/ROXICODONE) 5 MG immediate release tablet Take 1-2 tablets (5-10 mg total) by mouth every 3 (three) hours as needed for severe pain or breakthrough pain. Patient not taking: Reported on 06/25/2016 08/10/15   Tera PartridgeWolfe, Jon R, PA  polyethylene glycol powder (GLYCOLAX/MIRALAX) powder Take 1 Container by mouth once. 4 tablespoons at night    [provider]  senna (SENOKOT) 8.6 MG tablet Take 1 tablet by mouth daily. Reported on 08/08/2015    [provider]  solifenacin (VESICARE) 5 MG tablet Take 1 tablet (5 mg total) by mouth daily. 06/25/16   Alfredo MartinezMacDiarmid, Scott, MD  traMADol (ULTRAM) 50 MG tablet Take 1-2 tablets (50-100 mg total) by mouth every 4 (four) hours as needed for moderate pain. Patient not taking: Reported on 06/25/2016 08/10/15   Tera PartridgeWolfe, Jon R, PA      VITAL SIGNS:  Blood pressure 140/75, pulse 63, temperature 98.4 F (36.9 C), temperature source  Oral, resp. rate 18, height 5\' 6"  (1.676 m), weight 155 lb (70.3 kg), SpO2 100 %.  PHYSICAL EXAMINATION:  Physical Exam  GENERAL:  84 y.o.-year-old patient lying in the bed with no acute distress.  EYES: Pupils equal, round, reactive to light and accommodation. No scleral icterus. Extraocular muscles intact.  HEENT: Head atraumatic, normocephalic. Oropharynx and nasopharynx clear.  NECK:  Supple, no jugular venous distention. No thyroid enlargement, no tenderness.  LUNGS: Normal breath sounds bilaterally, no wheezing, rales,rhonchi or crepitation. No use of accessory muscles of respiration.  CARDIOVASCULAR: S1, S2 normal. No murmurs, rubs, or gallops.  ABDOMEN: Soft, nontender, nondistended. Bowel sounds present. No organomegaly or mass.  EXTREMITIES: No pedal edema, cyanosis, or clubbing.  NEUROLOGIC: Cranial nerves II through XII are intact. Muscle strength 5/5 in all extremities. Sensation intact. Gait not checked.  PSYCHIATRIC: The patient is alert and oriented x 3.  SKIN: No obvious rash, lesion, or ulcer.   LABORATORY PANEL:   CBC Recent Labs  Lab 11/05/17 1424  WBC 6.7  HGB 14.2  HCT 43.4  PLT 288   ------------------------------------------------------------------------------------------------------------------  Chemistries  Recent Labs  Lab 11/05/17 1424  NA 136  K 4.5  CL 101  CO2 28  GLUCOSE 158*  BUN 22  CREATININE 1.06*  CALCIUM 9.6   ------------------------------------------------------------------------------------------------------------------  Cardiac Enzymes Recent Labs  Lab 11/05/17 1424  TROPONINI <0.03   ------------------------------------------------------------------------------------------------------------------  RADIOLOGY:  Ct Head Wo Contrast  Result Date: 11/05/2017 CLINICAL DATA:  Altered level of consciousness, syncopal episode of 20 seconds duration, history coronary artery disease, hypertension, TIA, former smoker EXAM: CT HEAD  WITHOUT CONTRAST TECHNIQUE: Contiguous axial images were obtained from the base of the skull through the vertex without intravenous contrast. Sagittal and coronal MPR images reconstructed from axial data set. COMPARISON:  08/19/2013 FINDINGS: Brain: Generalized atrophy. Normal ventricular morphology. No midline shift or mass effect. Small vessel chronic ischemic changes of deep cerebral white matter. No intracranial hemorrhage, mass lesion, evidence of acute infarction, or extra-axial fluid collection. Vascular: Atherosclerotic calcification of internal carotid arteries at skull base Skull: Mild diffuse demineralization.  Calvaria intact. Sinuses/Orbits: Clear Other: N/A IMPRESSION: Atrophy with small vessel chronic ischemic changes of deep cerebral white matter. No acute intracranial abnormalities. Electronically Signed   By: Ulyses Southward M.D.   On: 11/05/2017 15:55   Dg Chest Portable 1 View  Result Date: 11/05/2017 CLINICAL DATA:  Syncope. EXAM: PORTABLE CHEST 1 VIEW COMPARISON:  Radiograph of Aug 19, 2013. FINDINGS: Stable cardiomediastinal silhouette. Stable large hiatal hernia is noted. No pneumothorax or pleural effusion is noted. Lungs are clear. Bony thorax is unremarkable. IMPRESSION: No acute cardiopulmonary abnormality seen.  Large hiatal hernia. Electronically Signed   By: Lupita Raider, M.D.   On: 11/05/2017 15:27      IMPRESSION AND PLAN:   Syncope due to severe bradycardia. The patient will be placed for observation. Continue telemetry monitor, hold Toprol, echocardiograph and cardiology consult.  Hypertension.  Controlled, hold Toprol due to symptomatic bradycardia.  Hypothyroidism.  Continue home Synthroid medication. All the records are reviewed and case discussed with ED provider. Management plans discussed with the patient, her daughter and they are in agreement.  CODE STATUS: DNR  TOTAL TIME TAKING CARE OF THIS PATIENT: 40 minutes.    Shaune Pollack M.D on 11/05/2017 at  4:28 PM  Between 7am to 6pm - Pager - [phone]  After 6pm go to [email or web address] - Social research officer, government  [facility] [place] [facility]  Office  [phone]  CC: Primary care physician; Danella Penton, MD   Note: This dictation was prepared with Dragon dictation along with smaller phrase technology. Any transcriptional errors that result from this process are unin

## 2017-11-05 NOTE — Progress Notes (Signed)
Chaplain was rounded in ED and stop to visit patient. Patient was very glad to see Chaplain she stated" I'm glad you are here. Patient felt as if she was dying. Chaplain sat with patient and allow her to share her narrative. Patient was move for ED to Telemetry. Chaplain will follow up with patient later.

## 2017-11-05 NOTE — ED Notes (Signed)
Zoll pads placed on pt. A & O at this time.

## 2017-11-05 NOTE — ED Triage Notes (Signed)
Pt brought over by Dr. Rondel BatonMiller's office after suffering syncopal episode for 20 seconds at his office. Pt was unresponsive for staff. Pt alert & oriented at this time. NAD Noted.

## 2017-11-05 NOTE — ED Notes (Signed)
While this RN was in the room pt states "I'm going out." Pt became unresponsive and bradycardiac to the 40's. MD Roxan Hockeyobinson at bedside. After approx 15 seconds pt regained consciousness and HR to the 60's. Pt remains on the monitor.

## 2017-11-05 NOTE — ED Notes (Signed)
Pt reports she is a DNR.

## 2017-11-05 NOTE — ED Notes (Signed)
Pt reports dizziness and "not feeling well" over last couple of days. Pt here with daughter and reports pt has had 2 syncopal episodes today. Pt was brought over from her PCP for evaluation. Pt reports being on prednisone for chronic L shoulder pain. Recently stopped taking sleeping medication because "it's off the market" and now reports feel off since stopping sleeping meds.

## 2017-11-05 NOTE — ED Notes (Signed)
During triage, pt states she felt dizzy and said "I'm going down again." She closed her eyes and began to sweat. She was not responsive for about 15 seconds and then awakened and asked this nurse "who are you?" When asked if she knew where she was, she responded that she was at the hospital and was alert. She was hot and sweaty. NAD noted at this time. When asked how she feels now, she responds "shitty."

## 2017-11-05 NOTE — ED Notes (Signed)
Pt denies pain at this time. Daughter remains at side.

## 2017-11-05 NOTE — ED Notes (Signed)
Report called to Ladona Ridgelaylor, RN on floor and pt updated with plan of care. Family remains at bedside.

## 2017-11-05 NOTE — Progress Notes (Signed)
Advanced Care Plan.  Purpose of Encounter: CODE STATUS. Parties in Attendance: The patient, her daughter and me. Patient's Decisional Capacity: Yes. Medical Story: Kerry Cabrera  is a 84 y.o. female with a known history of hypertension, hyperlipidemia, CAD, hypothyroidism, PUD, carotid arterial occlusion and TIA.  She had syncope episode 4 times today.  She is being admitted for severe bradycardia and syncope.  I discussed with the patient about her current condition, prognosis and CODE STATUS.  The patient does not want to be resuscitated or intubated.  Plan:  Code Status: DNR. Time spent discussing advance care planning: 17 minutes.

## 2017-11-05 NOTE — ED Notes (Signed)
Attempted to call report and they are changing the bed assignment, awaiting call back from RN for report.

## 2017-11-06 DIAGNOSIS — I493 Ventricular premature depolarization: Secondary | ICD-10-CM | POA: Diagnosis not present

## 2017-11-06 LAB — BASIC METABOLIC PANEL
ANION GAP: 8 (ref 5–15)
BUN: 19 mg/dL (ref 8–23)
CALCIUM: 9 mg/dL (ref 8.9–10.3)
CO2: 28 mmol/L (ref 22–32)
Chloride: 106 mmol/L (ref 98–111)
Creatinine, Ser: 0.83 mg/dL (ref 0.44–1.00)
Glucose, Bld: 107 mg/dL — ABNORMAL HIGH (ref 70–99)
POTASSIUM: 3.9 mmol/L (ref 3.5–5.1)
Sodium: 142 mmol/L (ref 135–145)

## 2017-11-06 LAB — ECHOCARDIOGRAM COMPLETE
HEIGHTINCHES: 66 in
WEIGHTICAEL: 2446.4 [oz_av]

## 2017-11-06 MED ORDER — VENLAFAXINE HCL ER 37.5 MG PO CP24
37.5000 mg | ORAL_CAPSULE | Freq: Every day | ORAL | Status: DC
  Administered 2017-11-07: 37.5 mg via ORAL
  Filled 2017-11-06 (×2): qty 1

## 2017-11-06 MED ORDER — ALPRAZOLAM 0.5 MG PO TABS
0.2500 mg | ORAL_TABLET | Freq: Three times a day (TID) | ORAL | Status: DC | PRN
  Administered 2017-11-06 (×2): 0.25 mg via ORAL
  Filled 2017-11-06 (×2): qty 1

## 2017-11-06 MED ORDER — OXYCODONE HCL 5 MG PO TABS
5.0000 mg | ORAL_TABLET | Freq: Four times a day (QID) | ORAL | Status: DC | PRN
  Administered 2017-11-06: 5 mg via ORAL
  Filled 2017-11-06: qty 1

## 2017-11-06 MED ORDER — PREDNISONE 10 MG PO TABS
5.0000 mg | ORAL_TABLET | Freq: Every day | ORAL | Status: DC
  Administered 2017-11-07: 5 mg via ORAL
  Filled 2017-11-06: qty 1

## 2017-11-06 NOTE — Consult Note (Signed)
[facility] Card[facility]  CARDIOLOGY CONSULT NOTE  Patient ID: Kerry Cabrera MRN: [id number] DOB/AGE: 82-Jul-1930 82 y.o.  Admit date: 11/05/2017 Referring Physician Imogene Burn Primary Physician [facility] Primary Cardiologist Par[place] Reason for Consultation Syncope, symptomatic bradycardia  HPI: 84 year old female referred for evaluation of syncope and symptomatic bradycardia.  Patient has a history of coronary artery disease per cardiac catheterization in 2006, premature ventricular contractions, hyperlipidemia, mitral regurgitation, history of TIA, hypertension, and hyperlipidemia.  The patient reports feeling lightheaded and "off" yesterday morning while preparing for a visit with her primary care provider.  She sat down several times throughout the morning to help relieve her symptoms, and asked her friend to drive her to the clinic.  In office visit the patient had a witnessed syncopal episode with preceding lightheadedness.  The episode lasted about 15 to 20 seconds.  The patient was brought to [facility] ER where she had another syncopal episode while on the telemetry monitor, which showed narrow complex bradycardia with rates in the low 30s.  The patient's heart rate returned to normal when placed in the Trendelenburg position.  The patient is not on any AV nodal blocking agents.  She denies experiencing chest pain or shortness of breath.  She denies palpitations or heart racing.  Other than the intermittent episodes of feeling lightheaded and weak, the patient reports feeling well overall.  Admission labs notable for troponin less than 0.03, magnesium 2.6.  Head CT negative for acute intracranial abnormality.  Chest x-ray negative for acute cardiopulmonary abnormality.  Review of systems complete and found to be negative unless listed above     Past Medical History:  Diagnosis Date  . Arthritis   . Carotid artery occlusion   . Complication of anesthesia    very confused for days  . Coronary artery disease    . Depression   . Glaucoma   . Hyperlipemia   . Hypertension    h/o not on medication  . Hypothyroidism   . Insomnia   . PUD (peptic ulcer disease)    in distant past  . TIA (transient ischemic attack)     Past Surgical History:  Procedure Laterality Date  . CARDIAC CATHETERIZATION    . KNEE ARTHROPLASTY Left 08/08/2015   Procedure: COMPUTER ASSISTED TOTAL KNEE ARTHROPLASTY;  Surgeon: Donato Heinz, MD;  Location: [facility] ORS;  Service: Orthopedics;  Laterality: Left;  . KNEE ARTHROSCOPY    . Percutaneous pinning of right femoral neck fracture    . TONSILLECTOMY    . TOTAL HIP ARTHROPLASTY Right 02/06/2014   Dr. Deeann Saint    Medications Prior to Admission  Medication Sig Dispense Refill Last Dose  . donepezil (ARICEPT) 5 MG tablet Take 5 mg by mouth at bedtime.     Marland Kitchen escitalopram (LEXAPRO) 10 MG tablet Take 10 mg by mouth daily.   11/04/2017 at Unknown time  . levothyroxine (SYNTHROID, LEVOTHROID) 112 MCG tablet Take 112 mcg by mouth daily before breakfast.   11/04/2017 at Unknown time  . LORazepam (ATIVAN) 0.5 MG tablet Take 0.5 mg by mouth at bedtime.   11/04/2017 at Unknown time  . predniSONE (DELTASONE) 1 MG tablet Take 4 mg by mouth daily with breakfast.     . predniSONE (DELTASONE) 5 MG tablet Take 5 mg by mouth daily with breakfast.   11/04/2017 at Unknown time  . venlafaxine XR (EFFEXOR-XR) 37.5 MG 24 hr capsule Take 37.5 mg by mouth daily with breakfast.   11/04/2017 at Unknown time  . ALPRAZolam (XANAX) 0.5 MG  tablet Take 0.5 mg by mouth at bedtime as needed for anxiety.   Not Taking at Unknown time  . Cyanocobalamin (VITAMIN B 12 PO) Take 1,000 mcg by mouth daily.   Not Taking at Unknown time  . Multiple Vitamin (MULTIVITAMIN) tablet Take 1 tablet by mouth daily.   Not Taking at Unknown time  . oxyCODONE (OXY IR/ROXICODONE) 5 MG immediate release tablet Take 1-2 tablets (5-10 mg total) by mouth every 3 (three) hours as needed for severe pain or breakthrough pain. (Patient  not taking: Reported on 06/25/2016) 30 tablet 0 Not Taking at Unknown time  . senna (SENOKOT) 8.6 MG tablet Take 1 tablet by mouth daily as needed for constipation. Reported on 08/08/2015   Not Taking at PRN  . solifenacin (VESICARE) 5 MG tablet Take 1 tablet (5 mg total) by mouth daily. (Patient not taking: Reported on 11/05/2017) 30 tablet 11 Not Taking at Unknown time  . traMADol (ULTRAM) 50 MG tablet Take 1-2 tablets (50-100 mg total) by mouth every 4 (four) hours as needed for moderate pain. (Patient not taking: Reported on 06/25/2016) 30 tablet 0 Not Taking at Unknown time   Social History   Socioeconomic History  . Marital status: Widowed    Spouse name: Not on file  . Number of children: Not on file  . Years of education: Not on file  . Highest education level: Not on file  Occupational History  . Not on file  Social Needs  . Financial resource strain: Not on file  . Food insecurity:    Worry: Not on file    Inability: Not on file  . Transportation needs:    Medical: Not on file    Non-medical: Not on file  Tobacco Use  . Smoking status: Former Smoker    Years: 12.00  . Smokeless tobacco: Never Used  Substance and Sexual Activity  . Alcohol use: Yes    Comment: osccasional  . Drug use: No  . Sexual activity: Not on file  Lifestyle  . Physical activity:    Days per week: Not on file    Minutes per session: Not on file  . Stress: Not on file  Relationships  . Social connections:    Talks on phone: Not on file    Gets together: Not on file    Attends religious service: Not on file    Active member of club or organization: Not on file    Attends meetings of clubs or organizations: Not on file    Relationship status: Not on file  . Intimate partner violence:    Fear of current or ex partner: Not on file    Emotionally abused: Not on file    Physically abused: Not on file    Forced sexual activity: Not on file  Other Topics Concern  . Not on file  Social History  Narrative  . Not on file    Family History  Problem Relation Age of Onset  . Prostate cancer Son   . Kidney disease Neg Hx   . Kidney cancer Neg Hx       Review of systems complete and found to be negative unless listed above      PHYSICAL EXAM  General: Well developed, well nourished, in no acute distress HEENT:  Normocephalic and atramatic Neck:  No JVD.  Lungs: Clear bilaterally to auscultation, normal effort of breathing on room air Heart: HRRR . Normal S1 and S2 without gallops or murmurs.  Abdomen: Bowel  sounds are positive, abdomen soft  Msk:  Back normal, able to sit upright in bed, gait not assessed. Normal strength and tone for age. Extremities: No clubbing, cyanosis or edema.   Neuro: Alert and oriented X 3. Psych:  Good affect, responds appropriately  Labs:   Lab Results  Component Value Date   WBC 6.7 11/05/2017   HGB 14.2 11/05/2017   HCT 43.4 11/05/2017   MCV 89.2 11/05/2017   PLT 288 11/05/2017    Recent Labs  Lab 11/06/17 0328  NA 142  K 3.9  CL 106  CO2 28  BUN 19  CREATININE 0.83  CALCIUM 9.0  GLUCOSE 107*   Lab Results  Component Value Date   CKTOTAL 90 08/19/2013   CKMB 1.0 08/19/2013   TROPONINI <0.03 11/05/2017    Lab Results  Component Value Date   CHOL 141 08/20/2013   Lab Results  Component Value Date   HDL 42 08/20/2013   Lab Results  Component Value Date   LDLCALC 73 08/20/2013   Lab Results  Component Value Date   TRIG 129 08/20/2013   No results found for: CHOLHDL No results found for: LDLDIRECT    Radiology: Ct Head Wo Contrast  Result Date: 11/05/2017 CLINICAL DATA:  Altered level of consciousness, syncopal episode of 20 seconds duration, history coronary artery disease, hypertension, TIA, former smoker EXAM: CT HEAD WITHOUT CONTRAST TECHNIQUE: Contiguous axial images were obtained from the base of the skull through the vertex without intravenous contrast. Sagittal and coronal MPR images reconstructed from  axial data set. COMPARISON:  08/19/2013 FINDINGS: Brain: Generalized atrophy. Normal ventricular morphology. No midline shift or mass effect. Small vessel chronic ischemic changes of deep cerebral white matter. No intracranial hemorrhage, mass lesion, evidence of acute infarction, or extra-axial fluid collection. Vascular: Atherosclerotic calcification of internal carotid arteries at skull base Skull: Mild diffuse demineralization.  Calvaria intact. Sinuses/Orbits: Clear Other: N/A IMPRESSION: Atrophy with small vessel chronic ischemic changes of deep cerebral white matter. No acute intracranial abnormalities. Electronically Signed   By: Ulyses SouthwardMark  Boles M.D.   On: 11/05/2017 15:55   Dg Chest Portable 1 View  Result Date: 11/05/2017 CLINICAL DATA:  Syncope. EXAM: PORTABLE CHEST 1 VIEW COMPARISON:  Radiograph of Aug 19, 2013. FINDINGS: Stable cardiomediastinal silhouette. Stable large hiatal hernia is noted. No pneumothorax or pleural effusion is noted. Lungs are clear. Bony thorax is unremarkable. IMPRESSION: No acute cardiopulmonary abnormality seen.  Large hiatal hernia. Electronically Signed   By: Lupita RaiderJames  Green Jr, M.D.   On: 11/05/2017 15:27    EKG: Sinus rhythm, rate 79 bpm  ASSESSMENT AND PLAN:  1. Syncope, likely due to bradycardia.  Patient had a syncopal episode while in the ER on telemetry which revealed narrow complex bradycardia with rates in the low 30s.  The patient's heart rate normalized when placed in Trendelenburg position.   2. Symptomatic bradycardia, resolved for now.  Currently in normal sinus rhythm at a rate of 79 bpm. 3.  Hypertension, blood pressure mildly elevated  Plan: 1.  Agree with overall plan 2.  Review 2D echocardiogram 3.  Continue to monitor on telemetry for recurrent bradycardia     Signed: Leanora Ivanoffnna Kysha Muralles MD,PhD, [facility] 11/06/2017, 8:20 AM

## 2017-11-06 NOTE — Care Management Obs Status (Signed)
MEDICARE OBSERVATION STATUS NOTIFICATION   Patient Details  Name: Kerry Cabrera MRN: [id number] Date of Birth: 08/27/1928   Medicare Observation Status Notification Given:  Yes    Eber HongGreene, Remy Dia R, RN 11/06/2017, 9:00 AM

## 2017-11-06 NOTE — Progress Notes (Signed)
   [facility]   PATIENT NAME: Kerry Cabrera    MR#:  [id number]  DATE OF BIRTH:  02/13/1929  SUBJECTIVE:  CHIEF COMPLAINT:   Chief Complaint  Patient presents with  . Loss of Consciousness    REVIEW OF SYSTEMS:  ROS  DRUG ALLERGIES:   Allergies  Allergen Reactions  . Ace Inhibitors Cough  . Codeine Nausea And Vomiting and Nausea Only  . Gabapentin Other (See Comments)    unsure  . Micardis Hct [Telmisartan-Hctz]     unsure  . Prednisone Other (See Comments)    Pt has received cortisone injection for knee with out problems. Pt states she will not take pills for extended time due to feeling lethargic and weight gain. Weight gain  . Sulfa Antibiotics Other (See Comments) and Rash    Fever 106 degrees,  sent to ER High fever  . Zithromax [Azithromycin] Other (See Comments) and Rash    unsure   VITALS:  Blood pressure (!) 150/72, pulse 77, temperature 98.1 F (36.7 C), temperature source Oral, resp. rate 18, height 5\' 6"  (1.676 m), weight 152 lb 14.4 oz (69.4 kg), SpO2 98 %. PHYSICAL EXAMINATION:  Physical Exam LABORATORY PANEL:  Female CBC Recent Labs  Lab 11/05/17 1424  WBC 6.7  HGB 14.2  HCT 43.4  PLT 288   ------------------------------------------------------------------------------------------------------------------ Chemistries  Recent Labs  Lab 11/05/17 1424 11/06/17 0328  NA 136 142  K 4.5 3.9  CL 101 106  CO2 28 28  GLUCOSE 158* 107*  BUN 22 19  CREATININE 1.06* 0.83  CALCIUM 9.6 9.0  MG 2.6*  --    RADIOLOGY:  Ct Head Wo Contrast  Result Date: 11/05/2017 CLINICAL DATA:  Altered level of consciousness, syncopal episode of 20 seconds duration, history coronary artery disease, hypertension, TIA, former smoker EXAM: CT HEAD WITHOUT CONTRAST TECHNIQUE: Contiguous axial images were obtained from the base of the skull through the vertex without intravenous contrast. Sagittal and coronal MPR images  reconstructed from axial data set. COMPARISON:  08/19/2013 FINDINGS: Brain: Generalized atrophy. Normal ventricular morphology. No midline shift or mass effect. Small vessel chronic ischemic changes of deep cerebral white matter. No intracranial hemorrhage, mass lesion, evidence of acute infarction, or extra-axial fluid collection. Vascular: Atherosclerotic calcification of internal carotid arteries at skull base Skull: Mild diffuse demineralization.  Calvaria intact. Sinuses/Orbits: Clear Other: N/A IMPRESSION: Atrophy with small vessel chronic ischemic changes of deep cerebral white matter. No acute intracranial abnormalities. Electronically Signed   By: Ulyses SouthwardMark  Boles M.D.   On: 11/05/2017 15:55   ASSESSMENT AND PLAN:   Syncope due to severe bradycardia.. Continue telemetry monitor, hold Toprol.  Echo is unremarkable. LV EF: 55% -   65%. Agree with overall plan per cardiology consult.  Hypertension. hold Toprol due to symptomatic bradycardia.  Hypothyroidism.  Continue home Synthroid medication. Anxiety.  Xanax as needed. All the records are reviewed and case discussed with Care Management/Social Worker. Management plans discussed with the patient, family and they are in agreement.  CODE STATUS: DNR  TOTAL TIME TAKING CARE OF THIS PATIENT: 36 minutes.   More than 50% of the time was spent in counseling/coordination of care: YES  POSSIBLE D/C IN 1 DAYS, DEPENDING ON CLINICAL CONDITION.   Kerry Cabrera M.D on 11/06/2017 at 3:36 PM  Between 7am to 6pm - Pager - [phone]  After 6pm go to [email or web address] - Therapist, nutritionalpassword EPAS [facility]  [facility] [place] [facility]

## 2017-11-06 NOTE — Care Management (Signed)
Placed in observation for syncopal episode- one which was observed in t the ED with drop in heart rate to the 30's.

## 2017-11-06 NOTE — Plan of Care (Signed)
  Problem: Clinical Measurements: Goal: Ability to maintain clinical measurements within normal limits will improve Outcome: Progressing Goal: Cardiovascular complication will be avoided Outcome: Progressing   

## 2017-11-06 NOTE — Plan of Care (Signed)
  Problem: Coping: Goal: Level of anxiety will decrease 11/06/2017 1857 by Jerene DillingJackson, Jajuan Skoog D, RN Outcome: Not Progressing 11/06/2017 1857 by Jerene DillingJackson, Brice Kossman D, RN Outcome: Not Progressing

## 2017-11-07 DIAGNOSIS — I493 Ventricular premature depolarization: Secondary | ICD-10-CM | POA: Diagnosis not present

## 2017-11-07 MED ORDER — ALPRAZOLAM 0.5 MG PO TABS: 0.5000 mg | ORAL_TABLET | Freq: Three times a day (TID) | ORAL | Status: DC | PRN

## 2017-11-07 NOTE — Discharge Summary (Signed)
[facility] [place] [facility]   PATIENT NAME: Kerry Cabrera    MR#:  [id number]  DATE OF BIRTH:  04/08/1929  DATE OF ADMISSION:  11/05/2017   ADMITTING PHYSICIAN: Shaune PollackQing Arzella Rehmann, MD  DATE OF DISCHARGE: 11/07/2017  PRIMARY CARE PHYSICIAN: Danella PentonMiller, Mark F, MD   ADMISSION DIAGNOSIS:  Syncope and collapse [R55] Symptomatic bradycardia [R00.1] DISCHARGE DIAGNOSIS:  Active Problems:   Symptomatic bradycardia  SECONDARY DIAGNOSIS:   Past Medical History:  Diagnosis Date  . Arthritis   . Carotid artery occlusion   . Complication of anesthesia    very confused for days  . Coronary artery disease   . Depression   . Glaucoma   . Hyperlipemia   . Hypertension    h/o not on medication  . Hypothyroidism   . Insomnia   . PUD (peptic ulcer disease)    in distant past  . TIA (transient ischemic attack)    HOSPITAL COURSE:  Syncope due to severe bradycardia.. No syncope and no bradycardia per telemetry monitor, hold Toprol.  Echo is unremarkable. LV EF: 55% - 65%. Agree with overall plan and outpatient Holter monitor per cardiology consult.  Hypertension. hold Toprol due to symptomatic bradycardia.  Hypothyroidism. Continue home Synthroid medication. Anxiety.  Xanax as needed. DISCHARGE CONDITIONS:  Stable, discharge to home today. CONSULTS OBTAINED:  Treatment Team:  Marcina MillardParaschos, Alexander, MD DRUG ALLERGIES:   Allergies  Allergen Reactions  . Ace Inhibitors Cough  . Codeine Nausea And Vomiting and Nausea Only  . Gabapentin Other (See Comments)    unsure  . Micardis Hct [Telmisartan-Hctz]     unsure  . Prednisone Other (See Comments)    Pt has received cortisone injection for knee with out problems. Pt states she will not take pills for extended time due to feeling lethargic and weight gain. Weight gain  . Sulfa Antibiotics Other (See Comments) and Rash    Fever 106 degrees,  sent to ER High fever  . Zithromax [Azithromycin] Other (See  Comments) and Rash    unsure   DISCHARGE MEDICATIONS:   Allergies as of 11/07/2017      Reactions   Ace Inhibitors Cough   Codeine Nausea And Vomiting, Nausea Only   Gabapentin Other (See Comments)   unsure   Micardis Hct [telmisartan-hctz]    unsure   Prednisone Other (See Comments)   Pt has received cortisone injection for knee with out problems. Pt states she will not take pills for extended time due to feeling lethargic and weight gain. Weight gain   Sulfa Antibiotics Other (See Comments), Rash   Fever 106 degrees,  sent to ER High fever   Zithromax [azithromycin] Other (See Comments), Rash   unsure      Medication List    TAKE these medications   ALPRAZolam 0.5 MG tablet Commonly known as:  XANAX Take 0.5 mg by mouth at bedtime as needed for anxiety.   donepezil 5 MG tablet Commonly known as:  ARICEPT Take 5 mg by mouth at bedtime.   escitalopram 10 MG tablet Commonly known as:  LEXAPRO Take 10 mg by mouth daily.   levothyroxine 112 MCG tablet Commonly known as:  SYNTHROID, LEVOTHROID Take 112 mcg by mouth daily before breakfast.   LORazepam 0.5 MG tablet Commonly known as:  ATIVAN Take 0.5 mg by mouth at bedtime.   multivitamin tablet Take 1 tablet by mouth daily.   oxyCODONE 5 MG immediate release tablet Commonly known as:  Oxy IR/ROXICODONE Take  1-2 tablets (5-10 mg total) by mouth every 3 (three) hours as needed for severe pain or breakthrough pain.   predniSONE 1 MG tablet Commonly known as:  DELTASONE Take 4 mg by mouth daily with breakfast.   predniSONE 5 MG tablet Commonly known as:  DELTASONE Take 5 mg by mouth daily with breakfast.   senna 8.6 MG tablet Commonly known as:  SENOKOT Take 1 tablet by mouth daily as needed for constipation. Reported on 08/08/2015   solifenacin 5 MG tablet Commonly known as:  VESICARE Take 1 tablet (5 mg total) by mouth daily.   traMADol 50 MG tablet Commonly known as:  ULTRAM Take 1-2 tablets (50-100 mg  total) by mouth every 4 (four) hours as needed for moderate pain.   venlafaxine XR 37.5 MG 24 hr capsule Commonly known as:  EFFEXOR-XR Take 37.5 mg by mouth daily with breakfast.   VITAMIN B 12 PO Take 1,000 mcg by mouth daily.        DISCHARGE INSTRUCTIONS:  See AVS. If you experience worsening of your admission symptoms, develop shortness of breath, life threatening emergency, suicidal or homicidal thoughts you must seek medical attention immediately by calling 911 or calling your MD immediately  if symptoms less severe.  You Must read complete instructions/literature along with all the possible adverse reactions/side effects for all the Medicines you take and that have been prescribed to you. Take any new Medicines after you have completely understood and accpet all the possible adverse reactions/side effects.   Please note  You were cared for by a hospitalist during your hospital stay. If you have any questions about your discharge medications or the care you received while you were in the hospital after you are discharged, you can call the unit and asked to speak with the hospitalist on call if the hospitalist that took care of you is not available. Once you are discharged, your primary care physician will handle any further medical issues. Please note that NO REFILLS for any discharge medications will be authorized once you are discharged, as it is imperative that you return to your primary care physician (or establish a relationship with a primary care physician if you do not have one) for your aftercare needs so that they can reassess your need for medications and monitor your lab values.    On the day of Discharge:  VITAL SIGNS:  Blood pressure 117/63, pulse 75, temperature 98.5 F (36.9 C), temperature source Oral, resp. rate 18, height 5\' 6"  (1.676 m), weight 152 lb 14.4 oz (69.4 kg), SpO2 96 %. PHYSICAL EXAMINATION:  GENERAL:  84 y.o.-year-old patient lying in the bed with  no acute distress.  EYES: Pupils equal, round, reactive to light and accommodation. No scleral icterus. Extraocular muscles intact.  HEENT: Head atraumatic, normocephalic. Oropharynx and nasopharynx clear.  NECK:  Supple, no jugular venous distention. No thyroid enlargement, no tenderness.  LUNGS: Normal breath sounds bilaterally, no wheezing, rales,rhonchi or crepitation. No use of accessory muscles of respiration.  CARDIOVASCULAR: S1, S2 normal. No murmurs, rubs, or gallops.  ABDOMEN: Soft, non-tender, non-distended. Bowel sounds present. No organomegaly or mass.  EXTREMITIES: No pedal edema, cyanosis, or clubbing.  NEUROLOGIC: Cranial nerves II through XII are intact. Muscle strength 5/5 in all extremities. Sensation intact. Gait not checked.  PSYCHIATRIC: The patient is alert and oriented x 3.  SKIN: No obvious rash, lesion, or ulcer.  DATA REVIEW:   CBC Recent Labs  Lab 11/05/17 1424  WBC 6.7  HGB 14.2  HCT 43.4  PLT 288    Chemistries  Recent Labs  Lab 11/05/17 1424 11/06/17 0328  NA 136 142  K 4.5 3.9  CL 101 106  CO2 28 28  GLUCOSE 158* 107*  BUN 22 19  CREATININE 1.06* 0.83  CALCIUM 9.6 9.0  MG 2.6*  --      Microbiology Results  Results for orders placed or performed during the hospital encounter of 10/18/16  C difficile quick scan w PCR reflex     Status: None   Collection Time: 10/18/16 11:00 AM  Result Value Ref Range Status   C Diff antigen NEGATIVE NEGATIVE Final   C Diff toxin NEGATIVE NEGATIVE Final   C Diff interpretation No C. difficile detected.  Final  Gastrointestinal Panel by PCR , Stool     Status: None   Collection Time: 10/18/16 11:00 AM  Result Value Ref Range Status   Campylobacter species NOT DETECTED NOT DETECTED Final   Plesimonas shigelloides NOT DETECTED NOT DETECTED Final   Salmonella species NOT DETECTED NOT DETECTED Final   Yersinia enterocolitica NOT DETECTED NOT DETECTED Final   Vibrio species NOT DETECTED NOT DETECTED Final    Vibrio cholerae NOT DETECTED NOT DETECTED Final   Enteroaggregative E coli (EAEC) NOT DETECTED NOT DETECTED Final   Enteropathogenic E coli (EPEC) NOT DETECTED NOT DETECTED Final   Enterotoxigenic E coli (ETEC) NOT DETECTED NOT DETECTED Final   Shiga like toxin producing E coli (STEC) NOT DETECTED NOT DETECTED Final   Shigella/Enteroinvasive E coli (EIEC) NOT DETECTED NOT DETECTED Final   Cryptosporidium NOT DETECTED NOT DETECTED Final   Cyclospora cayetanensis NOT DETECTED NOT DETECTED Final   Entamoeba histolytica NOT DETECTED NOT DETECTED Final   Giardia lamblia NOT DETECTED NOT DETECTED Final   Adenovirus F40/41 NOT DETECTED NOT DETECTED Final   Astrovirus NOT DETECTED NOT DETECTED Final   Norovirus GI/GII NOT DETECTED NOT DETECTED Final   Rotavirus A NOT DETECTED NOT DETECTED Final   Sapovirus (I, II, IV, and V) NOT DETECTED NOT DETECTED Final    RADIOLOGY:  No results found.   Management plans discussed with the patient, her daughter and they are in agreement.  CODE STATUS: DNR   TOTAL TIME TAKING CARE OF THIS PATIENT: 28 minutes.    Shaune Pollack M.D on 11/07/2017 at 10:48 AM  Between 7am to 6pm - Pager - [phone]  After 6pm go to [email or web address] - Social research officer, government  [facility] [place] [facility]  Office  [phone]  CC: Primary care physician; Danella Penton, MD   Note: This dictation was prepared with Dragon dictation along with smaller phrase technology. Any transcriptional errors that result from this process are unintentional.

## 2017-11-07 NOTE — Plan of Care (Signed)

## 2017-11-07 NOTE — Progress Notes (Signed)
Patient given discharge instructions with daughter at bedside. IV taken out and tele monitor off. Patient and daughter verbalized understanding with no further questions. Patient wheeled down to family vehicle in stable condition.

## 2017-12-25 ENCOUNTER — Other Ambulatory Visit: Payer: Self-pay | Admitting: Internal Medicine

## 2017-12-25 DIAGNOSIS — R1011 Right upper quadrant pain: Secondary | ICD-10-CM

## 2017-12-26 ENCOUNTER — Ambulatory Visit
Admission: RE | Admit: 2017-12-26 | Discharge: 2017-12-26 | Disposition: A | Discharge status: Home / Self Care | Payer: Medicare Other | Source: Ambulatory Visit | Attending: Internal Medicine | Admitting: Internal Medicine

## 2017-12-26 DIAGNOSIS — R1011 Right upper quadrant pain: Secondary | ICD-10-CM | POA: Diagnosis present

## 2017-12-26 DIAGNOSIS — K8020 Calculus of gallbladder without cholecystitis without obstruction: Secondary | ICD-10-CM | POA: Insufficient documentation

## 2018-01-02 ENCOUNTER — Emergency Department
Admission: EM | Admit: 2018-01-02 | Discharge: 2018-01-02 | Disposition: A | Discharge status: Home / Self Care | Payer: Medicare Other | Attending: Emergency Medicine | Admitting: Emergency Medicine

## 2018-01-02 ENCOUNTER — Other Ambulatory Visit: Payer: Self-pay

## 2018-01-02 ENCOUNTER — Encounter: Payer: Self-pay | Admitting: Emergency Medicine

## 2018-01-02 ENCOUNTER — Emergency Department: Payer: Medicare Other

## 2018-01-02 DIAGNOSIS — R109 Unspecified abdominal pain: Secondary | ICD-10-CM | POA: Diagnosis present

## 2018-01-02 DIAGNOSIS — Z8673 Personal history of transient ischemic attack (TIA), and cerebral infarction without residual deficits: Secondary | ICD-10-CM | POA: Insufficient documentation

## 2018-01-02 DIAGNOSIS — Z96641 Presence of right artificial hip joint: Secondary | ICD-10-CM | POA: Insufficient documentation

## 2018-01-02 DIAGNOSIS — E039 Hypothyroidism, unspecified: Secondary | ICD-10-CM | POA: Diagnosis not present

## 2018-01-02 DIAGNOSIS — Z96652 Presence of left artificial knee joint: Secondary | ICD-10-CM | POA: Diagnosis not present

## 2018-01-02 DIAGNOSIS — G479 Sleep disorder, unspecified: Secondary | ICD-10-CM | POA: Insufficient documentation

## 2018-01-02 DIAGNOSIS — I10 Essential (primary) hypertension: Secondary | ICD-10-CM | POA: Insufficient documentation

## 2018-01-02 DIAGNOSIS — Z87891 Personal history of nicotine dependence: Secondary | ICD-10-CM | POA: Diagnosis not present

## 2018-01-02 DIAGNOSIS — R1084 Generalized abdominal pain: Secondary | ICD-10-CM | POA: Insufficient documentation

## 2018-01-02 DIAGNOSIS — I2510 Atherosclerotic heart disease of native coronary artery without angina pectoris: Secondary | ICD-10-CM | POA: Insufficient documentation

## 2018-01-02 DIAGNOSIS — F419 Anxiety disorder, unspecified: Secondary | ICD-10-CM | POA: Insufficient documentation

## 2018-01-02 LAB — CBC
HCT: 38.1 % (ref 35.0–47.0)
Hemoglobin: 13.2 g/dL (ref 12.0–16.0)
MCH: 29.8 pg (ref 26.0–34.0)
MCHC: 34.7 g/dL (ref 32.0–36.0)
MCV: 85.9 fL (ref 80.0–100.0)
PLATELETS: 357 10*3/uL (ref 150–440)
RBC: 4.44 MIL/uL (ref 3.80–5.20)
RDW: 13.9 % (ref 11.5–14.5)
WBC: 6.1 10*3/uL (ref 3.6–11.0)

## 2018-01-02 LAB — BASIC METABOLIC PANEL
Anion gap: 10 (ref 5–15)
BUN: 15 mg/dL (ref 8–23)
CALCIUM: 9 mg/dL (ref 8.9–10.3)
CO2: 22 mmol/L (ref 22–32)
CREATININE: 0.84 mg/dL (ref 0.44–1.00)
Chloride: 102 mmol/L (ref 98–111)
GFR calc Af Amer: >60 mL/min (ref >60)
GLUCOSE: 210 mg/dL — AB (ref 70–99)
Potassium: 4 mmol/L (ref 3.5–5.1)
Sodium: 134 mmol/L — ABNORMAL LOW (ref 135–145)

## 2018-01-02 LAB — TROPONIN I: Troponin I: <0.03 ng/mL (ref <0.03)

## 2018-01-02 NOTE — ED Provider Notes (Signed)
[facility] Emergency Department Provider Note  ____________________________________________  Time seen: Approximately 3:48 PM  I have reviewed the triage vital signs and the nursing notes.   HISTORY  Chief Complaint Chest Pain and Weakness    HPI Kerry Cabrera is a 84 y.o. female history of anxiety on Xanax, sleep disturbance, HTN, TIA, presenting with abdominal pain and anxiety.  The patient reports that she was not feeling well while sitting in her down yesterday, and developed "a rainbow of pain" describing waves of abdominal pain from the epigastrium down throughout the entire abdomen.  She did not have associated back pain, nausea or vomiting, diaphoresis, constipation or diarrhea.  She has not been having any urinary symptoms, fevers or chills.  This made her very anxious, but she was "too scared" to take her Xanax.  She continued to have more waves of pain until 4 AM, when she did take Xanax, and her symptoms completely resolved and have not returned since then.  She was scared that she had had a heart attack; "I just knew I had had a heart attack."  At no time did she ever have any chest pain, shortness of breath, palpitations, lightheadedness or syncope.  She has remained symptom-free since 4 AM.  She has had prior panic attacks that have felt similar to this in the past.  Approximately 4 months ago, she was taken off of sleep add medication that she has been taking successfully for 40 years, and states that her sleep disturbance is worse than ever.  Past Medical History:  Diagnosis Date  . Arthritis   . Carotid artery occlusion   . Complication of anesthesia    very confused for days  . Coronary artery disease   . Depression   . Glaucoma   . Hyperlipemia   . Hypertension    h/o not on medication  . Hypothyroidism   . Insomnia   . PUD (peptic ulcer disease)    in distant past  . TIA (transient ischemic attack)     Patient Active Problem List    Diagnosis Date Noted  . Symptomatic bradycardia 11/05/2017  . Abnormal gait 12/17/2016  . Avascular necrosis of femoral head (HCC) 12/17/2016  . Bursitis of hip 12/17/2016  . CAD (coronary artery disease) 12/17/2016  . Closed fracture of intracapsular section of femur (HCC) 12/17/2016  . Closed transcervical fracture of femur (HCC) 12/17/2016  . Hip pain 12/17/2016  . Inflammation of sacroiliac joint (HCC) 12/17/2016  . Osteoarthritis of hip 12/17/2016  . Osteoarthritis of knee 12/17/2016  . Post-traumatic osteoarthritis 12/17/2016  . Diarrhea 09/20/2016  . Hiatal hernia 09/12/2016  . Acquired hypothyroidism 07/04/2016  . S/P total knee arthroplasty 08/08/2015  . Moderate mitral insufficiency 12/20/2014  . 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  . Hyperlipidemia, mixed 07/09/2013    Past Surgical History:  Procedure Laterality Date  . CARDIAC CATHETERIZATION    . KNEE ARTHROPLASTY Left 08/08/2015   Procedure: COMPUTER ASSISTED TOTAL KNEE ARTHROPLASTY;  Surgeon: Donato Heinz, MD;  Location: [facility] ORS;  Service: Orthopedics;  Laterality: Left;  . KNEE ARTHROSCOPY    . Percutaneous pinning of right femoral neck fracture    . TONSILLECTOMY    . TOTAL HIP ARTHROPLASTY Right 02/06/2014   Dr. Deeann Saint    Current Outpatient Rx  . Order #: [id number] Class: Historical Med  . Order #: [id number] Class: Historical Med  . Order #: [id number] Class: Historical Med  . Order #: [id number] Class: Historical Med  . Order #: [id number] Class: Historical  Med  . Order #: [id number] Class: Historical Med  . Order #: [id number] Class: Historical Med  . Order #: [id number] Class: Print  . Order #: [id number] Class: Historical Med  . Order #: [id number] Class: Historical Med  . Order #: [id number] Class: Historical Med  . Order #: [id number] Class: Print  . Order #: [id number] Class: Print  . Order #: [id number] Class: Historical Med    Allergies Ace inhibitors; Codeine; Gabapentin; Micardis hct [telmisartan-hctz];  Prednisone; Sulfa antibiotics; and Zithromax [azithromycin]  Family History  Problem Relation Age of Onset  . Prostate cancer Son   . Kidney disease Neg Hx   . Kidney cancer Neg Hx     Social History Social History   Tobacco Use  . Smoking status: Former Smoker    Years: 12.00  . Smokeless tobacco: Never Used  Substance Use Topics  . Alcohol use: Yes    Comment: osccasional  . Drug use: No    Review of Systems Constitutional: No fever/chills.  No lightheadedness or syncope.  No diaphoresis. Eyes: No visual changes. ENT: No sore throat. No congestion or rhinorrhea. Cardiovascular: Denies chest pain. Denies palpitations. Respiratory: Denies shortness of breath.  No cough. Gastrointestinal: Positive diffuse abdominal pain.  No nausea, no vomiting.  No diarrhea.  No constipation. Genitourinary: Negative for dysuria.  No urinary frequency.  No hematuria. Musculoskeletal: Negative for back pain. Skin: Negative for rash. Neurological: Negative for headaches. No focal numbness, tingling or weakness.  Psychiatric:Positive anxiety. ____________________________________________   PHYSICAL EXAM:  VITAL SIGNS: ED Triage Vitals [01/02/18 1137]  Enc Vitals Group     BP 104/68     Pulse Rate 91     Resp 16     Temp 98.5 F (36.9 C)     Temp Source Oral     SpO2 95 %     Weight 150 lb (68 kg)     Height 5\' 6"  (1.676 m)     Head Circumference      Peak Flow      Pain Score 0     Pain Loc      Pain Edu?      Excl. in GC?     Constitutional: Alert and oriented. Answers questions appropriately.  Anxious. Eyes: Conjunctivae are normal.  EOMI. No scleral icterus. Head: Atraumatic. Nose: No congestion/rhinnorhea. Mouth/Throat: Mucous membranes are moist.  Neck: No stridor.  Supple.  No JVD.  No meningismus. Cardiovascular: Normal rate, regular rhythm. No murmurs, rubs or gallops.  Respiratory: Normal respiratory effort.  No accessory muscle use or retractions. Lungs CTAB.   No wheezes, rales or ronchi. Gastrointestinal: Overweight.  Soft, nontender and highly distended.  Negative Murphy sign.  No guarding or rebound.  No peritoneal signs. Musculoskeletal: No LE edema. No ttp in the calves or palpable cords.  Negative Homan's sign. Neurologic:  A&Ox3.  Speech is clear.  Face and smile are symmetric.  EOMI.  Moves all extremities well. Skin:  Skin is warm, dry and intact. No rash noted. Psychiatric: Patient has a normal mood and anxious affect.  She does not have any pressured speech, and has good insight into her feelings.  ____________________________________________   LABS (all labs ordered are listed, but only abnormal results are displayed)  Labs Reviewed  BASIC METABOLIC PANEL - Abnormal; Notable for the following components:      Result Value   Sodium 134 (*)    Glucose, Bld 210 (*)    All other components within normal limits  CBC  TROPONIN I  TROPONIN I  ____________________________________________  EKG  ED ECG REPORT I, Anne-Caroline Sharma Covert, the attending physician, personally viewed and interpreted this ECG.   Date: 01/02/2018  EKG Time: 1124  Rate: 88  Rhythm: normal sinus rhythm; noted to have prominent P waves.  Axis: normal  Intervals:none  ST&T Change: No STEMI  This EKG is similar to prior EKGs cyst/7/17; morphologically it is somewhat different than EKG performed on 11/05/2017.  There are not any new ischemic changes  ____________________________________________  RADIOLOGY  Dg Chest 2 View  Result Date: 01/02/2018 CLINICAL DATA:  Chest pain and weakness. EXAM: CHEST - 2 VIEW COMPARISON:  11/05/2017. FINDINGS: Mediastinum and hilar structures normal. Heart size normal. Prominent unchanged hiatal hernia. No pleural effusion or pneumothorax. No acute bony abnormality. IMPRESSION: 1.  Prominent unchanged hiatal hernia. 2.  No acute cardiopulmonary disease. Electronically Signed   By: Maisie Fus  Register   On: 01/02/2018 12:38     ____________________________________________   PROCEDURES  Procedure(s) performed: None  Procedures  Critical Care performed: No ____________________________________________   INITIAL IMPRESSION / ASSESSMENT AND PLAN / ED COURSE  Pertinent labs & imaging results that were available during my care of the patient were reviewed by me and considered in my medical decision making (see chart for details).  84 y.o. female presenting with abdominal pain, now resolved after Xanax.  Overall, the patient is hemodynamically stable.  Clinically, her symptoms appear most consistent with anxiety, and completely resolved with Xanax.  She did not have any chest pain and cannot explain why she thought she might of had a heart attack.  Objectively, her vital signs are stable, her EKG does not show any ischemic changes and is unchanged compared to prior EKG in 2017, and she has negative troponin.  A second troponin is pending.  The patient is completely asymptomatic at this time.  We will plan to get a second troponin, and if it is negative, the patient will be discharged home with a close follow-up with her PMD.  I have talked to the patient about her sleep disturbance, and she plans to try the Xanax that she is prescribed by her PMD, with 12.5 mg of Benadryl; she will follow-up with her primary care physician for feedback on this regimen.  Follow-up instructions as well as return precautions were discussed with the patient and her daughter.  ____________________________________________  FINAL CLINICAL IMPRESSION(S) / ED DIAGNOSES  Final diagnoses:  Generalized abdominal pain  Anxiety  Sleep disturbance         NEW MEDICATIONS STARTED DURING THIS VISIT:  New Prescriptions   No medications on file      Rockne Menghini, MD 01/02/18 1558

## 2018-01-02 NOTE — ED Triage Notes (Signed)
Pt comes into the ED via POV c/o weakness and chest pain.  Patient states "I think I had a heart attack last night".  Patient describes an incidence last night where her chest started hurting, her skin was "crawling and I thought I was dying".  Patient states she felt this way for an hour yesterday and then she continued to feel weak this morning.  Patient has even and unlabored respirations at this time.  Patient took a xanax this morning to help relax her and she states that she feels a little better at this time.

## 2018-01-02 NOTE — ED Notes (Signed)
Pt given water and crackers  

## 2018-01-02 NOTE — Discharge Instructions (Addendum)
These drink plenty of fluid to stay well-hydrated.  Please continue to take all of your medications as prescribed.  If you develop anxiety or panic attack, you may take Xanax as prescribed by Dr. Hyacinth Meeker.  You may also take Xanax and 12.5 mg of Benadryl to help you sleep at night.  Return to the emergency department if you develop chest pain, shortness of breath, severe pain, lightheadedness or fainting, fever, or any other symptoms concerning to you.

## 2018-05-28 ENCOUNTER — Emergency Department
Admission: EM | Admit: 2018-05-28 | Discharge: 2018-05-28 | Disposition: A | Discharge status: Home / Self Care | Payer: Medicare Other | Attending: Emergency Medicine | Admitting: Emergency Medicine

## 2018-05-28 ENCOUNTER — Emergency Department: Payer: Medicare Other

## 2018-05-28 ENCOUNTER — Other Ambulatory Visit: Payer: Self-pay

## 2018-05-28 ENCOUNTER — Encounter: Payer: Self-pay | Admitting: Emergency Medicine

## 2018-05-28 DIAGNOSIS — Z87891 Personal history of nicotine dependence: Secondary | ICD-10-CM | POA: Insufficient documentation

## 2018-05-28 DIAGNOSIS — Z79899 Other long term (current) drug therapy: Secondary | ICD-10-CM | POA: Insufficient documentation

## 2018-05-28 DIAGNOSIS — Z8673 Personal history of transient ischemic attack (TIA), and cerebral infarction without residual deficits: Secondary | ICD-10-CM | POA: Insufficient documentation

## 2018-05-28 DIAGNOSIS — I10 Essential (primary) hypertension: Secondary | ICD-10-CM | POA: Insufficient documentation

## 2018-05-28 DIAGNOSIS — I2510 Atherosclerotic heart disease of native coronary artery without angina pectoris: Secondary | ICD-10-CM | POA: Diagnosis not present

## 2018-05-28 DIAGNOSIS — E785 Hyperlipidemia, unspecified: Secondary | ICD-10-CM | POA: Insufficient documentation

## 2018-05-28 DIAGNOSIS — R531 Weakness: Secondary | ICD-10-CM | POA: Insufficient documentation

## 2018-05-28 LAB — CBC
HEMATOCRIT: 39.1 % (ref 36.0–46.0)
HEMOGLOBIN: 12.5 g/dL (ref 12.0–15.0)
MCH: 27.8 pg (ref 26.0–34.0)
MCHC: 32 g/dL (ref 30.0–36.0)
MCV: 86.9 fL (ref 80.0–100.0)
Platelets: 232 10*3/uL (ref 150–400)
RBC: 4.5 MIL/uL (ref 3.87–5.11)
RDW: 14.6 % (ref 11.5–15.5)
WBC: 5 10*3/uL (ref 4.0–10.5)
nRBC: 0 % (ref 0.0–0.2)

## 2018-05-28 LAB — URINALYSIS, COMPLETE (UACMP) WITH MICROSCOPIC
BACTERIA UA: NONE SEEN
Bilirubin Urine: NEGATIVE
Glucose, UA: NEGATIVE mg/dL
Hgb urine dipstick: NEGATIVE
Ketones, ur: NEGATIVE mg/dL
Leukocytes,Ua: NEGATIVE
Nitrite: NEGATIVE
Protein, ur: NEGATIVE mg/dL
Specific Gravity, Urine: 1.011 (ref 1.005–1.030)
pH: 7 (ref 5.0–8.0)

## 2018-05-28 LAB — BASIC METABOLIC PANEL
Anion gap: 9 (ref 5–15)
BUN: 21 mg/dL (ref 8–23)
CHLORIDE: 103 mmol/L (ref 98–111)
CO2: 25 mmol/L (ref 22–32)
Calcium: 8.8 mg/dL — ABNORMAL LOW (ref 8.9–10.3)
Creatinine, Ser: 0.75 mg/dL (ref 0.44–1.00)
GFR calc Af Amer: >60 mL/min (ref >60)
GFR calc non Af Amer: >60 mL/min (ref >60)
GLUCOSE: 136 mg/dL — AB (ref 70–99)
POTASSIUM: 3.6 mmol/L (ref 3.5–5.1)
Sodium: 137 mmol/L (ref 135–145)

## 2018-05-28 LAB — TROPONIN I

## 2018-05-28 MED ORDER — SODIUM CHLORIDE 0.9 % IV BOLUS
1000.0000 mL | Freq: Once | INTRAVENOUS | Status: AC
  Administered 2018-05-28: 1000 mL via INTRAVENOUS

## 2018-05-28 NOTE — ED Notes (Signed)
Pt refusing to keep blood pressure cuff and pulse ox cord on at this time. MD aware.  

## 2018-05-28 NOTE — ED Notes (Signed)
Pt daughter, Verlon Au, aware of pt D/C. Pt daughter states that she will come pick pt up from the ED.

## 2018-05-28 NOTE — Discharge Instructions (Addendum)
Please follow-up with Dr. Hyacinth Meeker as soon as possible for recheck/reevaluation.  Please drink plenty of fluids and obtain plenty of rest.  Return to the emergency department for any chest pain, trouble breathing, or any other symptom personally concerning to yourself.

## 2018-05-28 NOTE — ED Triage Notes (Signed)
Pt presents from home via [facility] with c/o generalized weakness that began around 2am. Pt states she has been watching tv all day and then all of the sudden felt really weak while sitting in chair. Pt c/o no pain at this time. Pt 65 bpm with first degree heart block for ems. CBG 156. Pt alert and oriented x4 at this time.

## 2018-05-28 NOTE — ED Provider Notes (Signed)
[facility] Emergency Department Provider Note  Time seen: 3:12 AM  I have reviewed the triage vital signs and the nursing notes.   HISTORY  Chief Complaint Weakness    HPI Kerry Cabrera is a 85 y.o. female with a past medical history of arthritis, CAD, depression, hypertension, hyperlipidemia, TIA, presents to the emergency department for acute onset of weakness.  According to the patient she was watching TV around 2:00 this morning when she began feeling very weak.  States she went to the restroom to use the bathroom and was able to get herself back to the bed but continued to feel weakness all over her body and felt "peculiar."  Denies any headache.  Denies any weakness or numbness, nausea, vomiting, diarrhea.  Denies any chest pain or abdominal pain.  Denies shortness of breath recent cough or congestion.  Denies any fever.  Negative review of systems besides general fatigue/weakness.   Past Medical History:  Diagnosis Date  . Arthritis   . Carotid artery occlusion   . Complication of anesthesia    very confused for days  . Coronary artery disease   . Depression   . Glaucoma   . Hyperlipemia   . Hypertension    h/o not on medication  . Hypothyroidism   . Insomnia   . PUD (peptic ulcer disease)    in distant past  . TIA (transient ischemic attack)     Patient Active Problem List   Diagnosis Date Noted  . Symptomatic bradycardia 11/05/2017  . Abnormal gait 12/17/2016  . Avascular necrosis of femoral head (HCC) 12/17/2016  . Bursitis of hip 12/17/2016  . CAD (coronary artery disease) 12/17/2016  . Closed fracture of intracapsular section of femur (HCC) 12/17/2016  . Closed transcervical fracture of femur (HCC) 12/17/2016  . Hip pain 12/17/2016  . Inflammation of sacroiliac joint (HCC) 12/17/2016  . Osteoarthritis of hip 12/17/2016  . Osteoarthritis of knee 12/17/2016  . Post-traumatic osteoarthritis 12/17/2016  . Diarrhea 09/20/2016  .  Hiatal hernia 09/12/2016  . Acquired hypothyroidism 07/04/2016  . S/P total knee arthroplasty 08/08/2015  . Moderate mitral insufficiency 12/20/2014  . 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  . Hyperlipidemia, mixed 07/09/2013    Past Surgical History:  Procedure Laterality Date  . CARDIAC CATHETERIZATION    . KNEE ARTHROPLASTY Left 08/08/2015   Procedure: COMPUTER ASSISTED TOTAL KNEE ARTHROPLASTY;  Surgeon: Donato Heinz, MD;  Location: [facility] ORS;  Service: Orthopedics;  Laterality: Left;  . KNEE ARTHROSCOPY    . Percutaneous pinning of right femoral neck fracture    . TONSILLECTOMY    . TOTAL HIP ARTHROPLASTY Right 02/06/2014   Dr. Deeann Saint    Prior to Admission medications   Medication Sig Start Date End Date Taking? Authorizing Provider  ALPRAZolam Prudy Feeler) 0.5 MG tablet Take 0.5 mg by mouth at bedtime as needed for anxiety.    [provider]  Cyanocobalamin (VITAMIN B 12 PO) Take 1,000 mcg by mouth daily.    [provider]  donepezil (ARICEPT) 5 MG tablet Take 5 mg by mouth at bedtime.    [provider]  escitalopram (LEXAPRO) 10 MG tablet Take 10 mg by mouth daily.    [provider]  levothyroxine (SYNTHROID, LEVOTHROID) 112 MCG tablet Take 112 mcg by mouth daily before breakfast.    [provider]  LORazepam (ATIVAN) 0.5 MG tablet Take 0.5 mg by mouth at bedtime.    [provider]  Multiple Vitamin (MULTIVITAMIN) tablet Take 1  tablet by mouth daily.    [provider]  oxyCODONE (OXY IR/ROXICODONE) 5 MG immediate release tablet Take 1-2 tablets (5-10 mg total) by mouth every 3 (three) hours as needed for severe pain or breakthrough pain. Patient not taking: Reported on 06/25/2016 08/10/15   Tera Partridge, PA  predniSONE (DELTASONE) 1 MG tablet Take 4 mg by mouth daily with breakfast.    [provider]  predniSONE (DELTASONE) 5 MG tablet Take 5 mg by mouth daily with breakfast.    [provider]  senna  (SENOKOT) 8.6 MG tablet Take 1 tablet by mouth daily as needed for constipation. Reported on 08/08/2015    [provider]  solifenacin (VESICARE) 5 MG tablet Take 1 tablet (5 mg total) by mouth daily. Patient not taking: Reported on 11/05/2017 06/25/16   Alfredo Martinez, MD  traMADol (ULTRAM) 50 MG tablet Take 1-2 tablets (50-100 mg total) by mouth every 4 (four) hours as needed for moderate pain. Patient not taking: Reported on 06/25/2016 08/10/15   Tera Partridge, PA  venlafaxine XR (EFFEXOR-XR) 37.5 MG 24 hr capsule Take 37.5 mg by mouth daily with breakfast.    [provider]    Allergies  Allergen Reactions  . Ace Inhibitors Cough  . Codeine Nausea And Vomiting and Nausea Only  . Gabapentin Other (See Comments)    unsure  . Micardis Hct [Telmisartan-Hctz]     unsure  . Prednisone Other (See Comments)    Pt has received cortisone injection for knee with out problems. Pt states she will not take pills for extended time due to feeling lethargic and weight gain. Weight gain  . Sulfa Antibiotics Other (See Comments) and Rash    Fever 106 degrees,  sent to ER High fever  . Zithromax [Azithromycin] Other (See Comments) and Rash    unsure    Family History  Problem Relation Age of Onset  . Prostate cancer Son   . Kidney disease Neg Hx   . Kidney cancer Neg Hx     Social History Social History   Tobacco Use  . Smoking status: Former Smoker    Years: 12.00  . Smokeless tobacco: Never Used  Substance Use Topics  . Alcohol use: Yes    Comment: osccasional  . Drug use: No    Review of Systems Constitutional: Negative for fever.  Positive for generalized fatigue/weakness. ENT: Negative for recent illness/congestion Cardiovascular: Negative for chest pain. Respiratory: Negative for shortness of breath. Gastrointestinal: Negative for abdominal pain, vomiting and diarrhea. Genitourinary: Negative for urinary compaints Musculoskeletal: Negative for musculoskeletal  complaints Skin: Negative for skin complaints  Neurological: Negative for headache All other ROS negative  ____________________________________________   PHYSICAL EXAM:  VITAL SIGNS: ED Triage Vitals  Enc Vitals Group     BP 05/28/18 0251 (!) 179/73     Pulse Rate 05/28/18 0251 63     Resp 05/28/18 0251 (!) 24     Temp 05/28/18 0248 (!) 97.5 F (36.4 C)     Temp Source 05/28/18 0248 Oral     SpO2 05/28/18 0251 94 %     Weight 05/28/18 0247 150 lb (68 kg)     Height 05/28/18 0247 5\' 6"  (1.676 m)     Head Circumference --      Peak Flow --      Pain Score 05/28/18 0248 0     Pain Loc --      Pain Edu? --      Excl.  in GC? --    Constitutional: Alert, oriented to person and place.  Was unable to tell me the correct year.. Well appearing and in no distress. Eyes: Normal exam ENT   Head: Normocephalic and atraumatic.   Mouth/Throat: Mucous membranes are moist. Cardiovascular: Normal rate, regular rhythm.  Respiratory: Normal respiratory effort without tachypnea nor retractions. Breath sounds are clear  Gastrointestinal: Soft and nontender. No distention.  Musculoskeletal: Nontender with normal range of motion in all extremities Neurologic:  Normal speech and language. No gross focal neurologic deficits.  Equal grip strength bilaterally.  No cranial nerve deficits. Skin:  Skin is warm, dry and intact.  Psychiatric: Mood and affect are normal.   ____________________________________________    EKG  EKG viewed and interpreted by myself shows tachycardia around 115 bpm with a narrow QRS, normal axis, largely normal intervals no concerning ST changes.  ____________________________________________    RADIOLOGY  Chest x-ray is negative. CT scan of the head is negative.  ____________________________________________   INITIAL IMPRESSION / ASSESSMENT AND PLAN / ED COURSE  Pertinent labs & imaging results that were available during my care of the patient were  reviewed by me and considered in my medical decision making (see chart for details).  Patient presents to the emergency department for weakness/fatigue starting around 2:00 this morning.  Patient states she has difficulty sleeping, she was up watching TV around 2:00 felt weak.  Denies any particular symptoms besides feeling weak and "peculiar."  Currently normal physical exam including neurological exam.  Denies any specific symptoms.  We will check labs, urinalysis will also obtain CT scan of the head and chest x-ray as a precaution.  Differential would include metabolic or electrolyte abnormality, infectious etiology, TIA/CVA/ICH.  Patient's work-up has resulted overall very reassuring.  Labs are within normal limits.  Urinalysis negative.  CT scan of the head and chest x-ray are normal.  I discussed these results with the patient, she is reassured but states she just does not feel well.  When asked specifically what feels unwell patient states "I feel very anxious."  Patient does states she feels considerably better after the work-up and fluids.  We will ambulate the patient in the emergency department to see how she does.  Patient's work-up exam and vitals are all reassuring thus far.  Patient able to ambulate.  Heart rate currently between 95 and 105 bpm.  Patient states she will call Dr. Hyacinth MeekerMiller today to inform him of today's visit.  I also discussed return precautions.  Patient agreeable to plan of care.  ____________________________________________   FINAL CLINICAL IMPRESSION(S) / ED DIAGNOSES  Weakness   Minna AntisPaduchowski, Zimere Dunlevy, MD 05/28/18 [phone]

## 2018-05-28 NOTE — ED Notes (Signed)
Daughter called and notified that pt is here. Pt given the phone to speak with the daughter.

## 2018-05-28 NOTE — ED Notes (Signed)
Pt given water to drink. MD aware. 

## 2018-05-28 NOTE — ED Notes (Signed)
ED Provider at bedside. 

## 2018-05-28 NOTE — ED Notes (Signed)
Pt ambulating in room with this RN and Dorian, NT. Pt in NAD during ambulation. Respirations even and unlabored. Pt with steady gait. Pt assisted back to bed and new purewick placed on pt at this time. This RN will continue to monitor.

## 2018-07-23 ENCOUNTER — Other Ambulatory Visit: Payer: Self-pay

## 2018-07-23 ENCOUNTER — Emergency Department: Payer: Medicare Other

## 2018-07-23 ENCOUNTER — Emergency Department
Admission: EM | Admit: 2018-07-23 | Discharge: 2018-07-23 | Disposition: A | Discharge status: Home / Self Care | Payer: Medicare Other | Attending: Emergency Medicine | Admitting: Emergency Medicine

## 2018-07-23 ENCOUNTER — Encounter: Payer: Self-pay | Admitting: Emergency Medicine

## 2018-07-23 DIAGNOSIS — N390 Urinary tract infection, site not specified: Secondary | ICD-10-CM | POA: Insufficient documentation

## 2018-07-23 DIAGNOSIS — E039 Hypothyroidism, unspecified: Secondary | ICD-10-CM | POA: Diagnosis not present

## 2018-07-23 DIAGNOSIS — E860 Dehydration: Secondary | ICD-10-CM | POA: Diagnosis not present

## 2018-07-23 DIAGNOSIS — R531 Weakness: Secondary | ICD-10-CM | POA: Diagnosis not present

## 2018-07-23 DIAGNOSIS — Z96641 Presence of right artificial hip joint: Secondary | ICD-10-CM | POA: Diagnosis not present

## 2018-07-23 DIAGNOSIS — I2510 Atherosclerotic heart disease of native coronary artery without angina pectoris: Secondary | ICD-10-CM | POA: Insufficient documentation

## 2018-07-23 DIAGNOSIS — I10 Essential (primary) hypertension: Secondary | ICD-10-CM | POA: Diagnosis not present

## 2018-07-23 DIAGNOSIS — Z96652 Presence of left artificial knee joint: Secondary | ICD-10-CM | POA: Insufficient documentation

## 2018-07-23 DIAGNOSIS — Z79899 Other long term (current) drug therapy: Secondary | ICD-10-CM | POA: Insufficient documentation

## 2018-07-23 DIAGNOSIS — Z8673 Personal history of transient ischemic attack (TIA), and cerebral infarction without residual deficits: Secondary | ICD-10-CM | POA: Insufficient documentation

## 2018-07-23 DIAGNOSIS — Z87891 Personal history of nicotine dependence: Secondary | ICD-10-CM | POA: Diagnosis not present

## 2018-07-23 DIAGNOSIS — R42 Dizziness and giddiness: Secondary | ICD-10-CM | POA: Diagnosis present

## 2018-07-23 DIAGNOSIS — R55 Syncope and collapse: Secondary | ICD-10-CM

## 2018-07-23 LAB — CBC WITH DIFFERENTIAL/PLATELET
Abs Immature Granulocytes: 0.05 10*3/uL (ref 0.00–0.07)
Basophils Absolute: 0.1 10*3/uL (ref 0.0–0.1)
Basophils Relative: 1 %
Eosinophils Absolute: 0 10*3/uL (ref 0.0–0.5)
Eosinophils Relative: 0 %
HCT: 46.1 % — ABNORMAL HIGH (ref 36.0–46.0)
Hemoglobin: 14.5 g/dL (ref 12.0–15.0)
Immature Granulocytes: 1 %
Lymphocytes Relative: 14 %
Lymphs Abs: 1.2 10*3/uL (ref 0.7–4.0)
MCH: 28.3 pg (ref 26.0–34.0)
MCHC: 31.5 g/dL (ref 30.0–36.0)
MCV: 90 fL (ref 80.0–100.0)
Monocytes Absolute: 0.7 10*3/uL (ref 0.1–1.0)
Monocytes Relative: 8 %
Neutro Abs: 6.5 10*3/uL (ref 1.7–7.7)
Neutrophils Relative %: 76 %
Platelets: 288 10*3/uL (ref 150–400)
RBC: 5.12 MIL/uL — ABNORMAL HIGH (ref 3.87–5.11)
RDW: 14.9 % (ref 11.5–15.5)
WBC: 8.5 10*3/uL (ref 4.0–10.5)
nRBC: 0 % (ref 0.0–0.2)

## 2018-07-23 LAB — COMPREHENSIVE METABOLIC PANEL
ALT: 17 U/L (ref 0–44)
AST: 20 U/L (ref 15–41)
Albumin: 3.8 g/dL (ref 3.5–5.0)
Alkaline Phosphatase: 72 U/L (ref 38–126)
Anion gap: 12 (ref 5–15)
BUN: 26 mg/dL — ABNORMAL HIGH (ref 8–23)
CO2: 26 mmol/L (ref 22–32)
Calcium: 9.4 mg/dL (ref 8.9–10.3)
Chloride: 101 mmol/L (ref 98–111)
Creatinine, Ser: 1.05 mg/dL — ABNORMAL HIGH (ref 0.44–1.00)
GFR calc Af Amer: 55 mL/min — ABNORMAL LOW (ref >60)
GFR calc non Af Amer: 48 mL/min — ABNORMAL LOW (ref >60)
Glucose, Bld: 162 mg/dL — ABNORMAL HIGH (ref 70–99)
Potassium: 4.3 mmol/L (ref 3.5–5.1)
Sodium: 139 mmol/L (ref 135–145)
Total Bilirubin: 0.6 mg/dL (ref 0.3–1.2)
Total Protein: 6.8 g/dL (ref 6.5–8.1)

## 2018-07-23 LAB — URINALYSIS, COMPLETE (UACMP) WITH MICROSCOPIC
Bacteria, UA: NONE SEEN
Bilirubin Urine: NEGATIVE
Glucose, UA: NEGATIVE mg/dL
Hgb urine dipstick: NEGATIVE
Ketones, ur: 5 mg/dL — AB
Nitrite: NEGATIVE
Protein, ur: 30 mg/dL — AB
Specific Gravity, Urine: 1.023 (ref 1.005–1.030)
pH: 5 (ref 5.0–8.0)

## 2018-07-23 LAB — GLUCOSE, CAPILLARY: Glucose-Capillary: 118 mg/dL — ABNORMAL HIGH (ref 70–99)

## 2018-07-23 LAB — TROPONIN I: Troponin I: <0.03 ng/mL (ref <0.03)

## 2018-07-23 MED ORDER — CEPHALEXIN 500 MG PO CAPS: 500.0000 mg | ORAL_CAPSULE | Freq: Three times a day (TID) | ORAL | 0 refills | Status: AC

## 2018-07-23 MED ORDER — SODIUM CHLORIDE 0.9 % IV BOLUS
500.0000 mL | Freq: Once | INTRAVENOUS | Status: AC
  Administered 2018-07-23: 17:00:00 500 mL via INTRAVENOUS

## 2018-07-23 MED ORDER — SODIUM CHLORIDE 0.9 % IV SOLN
1.0000 g | Freq: Once | INTRAVENOUS | Status: AC
  Administered 2018-07-23: 1 g via INTRAVENOUS
  Filled 2018-07-23: qty 10

## 2018-07-23 NOTE — ED Provider Notes (Addendum)
[facility] Emergency Department Provider Note  ____________________________________________   I have reviewed the triage vital signs and the nursing notes. Where available I have reviewed prior notes and, if possible and indicated, outside hospital notes.    HISTORY  Chief Complaint Loss of Consciousness    HPI Kerry Cabrera is a 85 y.o. female who presents here today complaining of wanting to go home.  She states that she was going to [place] to get groceries and then going to go to the liquor store to get some jim beam because 1 of her friends stated that they should drink of that tonight.  Patient states that she also went to [place].  States she had not had much to eat or drink all day long because she wanted to go to the grocery store first to make sure she had enough although she does have the food at home.  Is not exactly clear why she did not eat or drink anything during the course of the day but she states she did not.  Sounds like she had perhaps a Ensure or something like that for breakfast and then did not eat anything until she went to the [employer].  At the [facility] she felt hot and lightheaded and she states someone helped to the ground.  She did not hit her head.  She does not have any pain anywhere.  She states that she feels "fine" and just wants to go home.  She denies any other prodrome, she states she felt just fine before she got "hot" and she feels fine now.  She denies any chest pain shortness of breath nausea vomiting headache diarrhea cough stiff neck fever chills dysuria urinary frequency or other concerns. There is no seizure activity she did not bite her tongue she was not incontinent of bowel or bladder she does not have a history of seizures and she states that she was not confused when she woke up.  She states she thinks she just overdid it and did not have enough to eat or drink.  He states that she has had syncope in the past, she was seen  for syncope and had an echocardiogram done in July 2019 which was normal  Past Medical History:  Diagnosis Date  . Arthritis   . Carotid artery occlusion   . Complication of anesthesia    very confused for days  . Coronary artery disease   . Depression   . Glaucoma   . Hyperlipemia   . Hypertension    h/o not on medication  . Hypothyroidism   . Insomnia   . PUD (peptic ulcer disease)    in distant past  . TIA (transient ischemic attack)     Patient Active Problem List   Diagnosis Date Noted  . Symptomatic bradycardia 11/05/2017  . Abnormal gait 12/17/2016  . Avascular necrosis of femoral head (HCC) 12/17/2016  . Bursitis of hip 12/17/2016  . CAD (coronary artery disease) 12/17/2016  . Closed fracture of intracapsular section of femur (HCC) 12/17/2016  . Closed transcervical fracture of femur (HCC) 12/17/2016  . Hip pain 12/17/2016  . Inflammation of sacroiliac joint (HCC) 12/17/2016  . Osteoarthritis of hip 12/17/2016  . Osteoarthritis of knee 12/17/2016  . Post-traumatic osteoarthritis 12/17/2016  . Diarrhea 09/20/2016  . Hiatal hernia 09/12/2016  . Acquired hypothyroidism 07/04/2016  . S/P total knee arthroplasty 08/08/2015  . Moderate mitral insufficiency 12/20/2014  . 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  . Hyperlipidemia, mixed 07/09/2013  Past Surgical History:  Procedure Laterality Date  . CARDIAC CATHETERIZATION    . KNEE ARTHROPLASTY Left 08/08/2015   Procedure: COMPUTER ASSISTED TOTAL KNEE ARTHROPLASTY;  Surgeon: Donato Heinz, MD;  Location: [facility] ORS;  Service: Orthopedics;  Laterality: Left;  . KNEE ARTHROSCOPY    . Percutaneous pinning of right femoral neck fracture    . TONSILLECTOMY    . TOTAL HIP ARTHROPLASTY Right 02/06/2014   Dr. Deeann Saint    Prior to Admission medications   Medication Sig Start Date End Date Taking? Authorizing Provider  ALPRAZolam Prudy Feeler) 0.5 MG tablet Take 0.5 mg by mouth at bedtime as needed for anxiety.    [provider]  Cyanocobalamin (VITAMIN B 12 PO) Take 1,000 mcg by mouth daily.    [provider]  donepezil (ARICEPT) 5 MG tablet Take 5 mg by mouth at bedtime.    [provider]  escitalopram (LEXAPRO) 10 MG tablet Take 10 mg by mouth daily.    [provider]  levothyroxine (SYNTHROID, LEVOTHROID) 112 MCG tablet Take 112 mcg by mouth daily before breakfast.    [provider]  LORazepam (ATIVAN) 0.5 MG tablet Take 0.5 mg by mouth at bedtime.    [provider]  Multiple Vitamin (MULTIVITAMIN) tablet Take 1 tablet by mouth daily.    [provider]  oxyCODONE (OXY IR/ROXICODONE) 5 MG immediate release tablet Take 1-2 tablets (5-10 mg total) by mouth every 3 (three) hours as needed for severe pain or breakthrough pain. Patient not taking: Reported on 06/25/2016 08/10/15   Tera Partridge, PA  predniSONE (DELTASONE) 1 MG tablet Take 4 mg by mouth daily with breakfast.    [provider]  predniSONE (DELTASONE) 5 MG tablet Take 5 mg by mouth daily with breakfast.    [provider]  senna (SENOKOT) 8.6 MG tablet Take 1 tablet by mouth daily as needed for constipation. Reported on 08/08/2015    [provider]  solifenacin (VESICARE) 5 MG tablet Take 1 tablet (5 mg total) by mouth daily. Patient not taking: Reported on 11/05/2017 06/25/16   Alfredo Martinez, MD  traMADol (ULTRAM) 50 MG tablet Take 1-2 tablets (50-100 mg total) by mouth every 4 (four) hours as needed for moderate pain. Patient not taking: Reported on 06/25/2016 08/10/15   Tera Partridge, PA  venlafaxine XR (EFFEXOR-XR) 37.5 MG 24 hr capsule Take 37.5 mg by mouth daily with breakfast.    [provider]    Allergies Ace inhibitors; Codeine; Gabapentin; Micardis hct [telmisartan-hctz]; Prednisone; Sulfa antibiotics; and Zithromax [azithromycin]  Family History  Problem Relation Age of Onset  . Prostate cancer Son   . Kidney disease Neg Hx   . Kidney  cancer Neg Hx     Social History Social History   Tobacco Use  . Smoking status: Former Smoker    Years: 12.00  . Smokeless tobacco: Never Used  Substance Use Topics  . Alcohol use: Yes    Comment: osccasional  . Drug use: No    Review of Systems Constitutional: No fever/chills Eyes: No visual changes. ENT: No sore throat. No stiff neck no neck pain Cardiovascular: Denies chest pain. Respiratory: Denies shortness of breath. Gastrointestinal:   no vomiting.  No diarrhea.  No constipation. Genitourinary: Negative for dysuria. Musculoskeletal: Negative lower extremity swelling Skin: Negative for rash. Neurological: Negative for severe headaches, focal weakness or numbness.   ____________________________________________   PHYSICAL EXAM:  VITAL SIGNS: ED Triage Vitals  Enc Vitals Group  BP 07/23/18 1555 129/65     Pulse Rate 07/23/18 1555 (!) 113     Resp --      Temp 07/23/18 1555 97.9 F (36.6 C)     Temp Source 07/23/18 1555 Oral     SpO2 07/23/18 1555 98 %     Weight 07/23/18 1557 150 lb (68 kg)     Height 07/23/18 1557  (1.676 m)     Head Circumference --      Peak Flow --      Pain Score 07/23/18 1557 0     Pain Loc --      Pain Edu? --      Excl. in GC? --     Constitutional: Alert and oriented. Well appearing and in no acute distress. Eyes: Conjunctivae are normal Head: Atraumatic HEENT: No congestion/rhinnorhea. Mucous membranes are moist.  Oropharynx non-erythematous Neck:   Nontender with no meningismus, no masses, no stridor Cardiovascular: Normal rate, regular rhythm. Grossly normal heart sounds.  Good peripheral circulation. Respiratory: Normal respiratory effort.  No retractions. Lungs CTAB. Abdominal: Soft and nontender. No distention. No guarding no rebound Back:  There is no focal tenderness or step off.  there is no midline tenderness there are no lesions noted. there is no CVA tenderness Musculoskeletal: No lower extremity  tenderness, no upper extremity tenderness. No joint effusions, no DVT signs strong distal pulses no edema Neurologic:  Normal speech and language. No gross focal neurologic deficits are appreciated.  Skin:  Skin is warm, dry and intact. No rash noted. Psychiatric: Mood and affect are mood anxious. Speech and behavior are normal.  ____________________________________________   LABS (all labs ordered are listed, but only abnormal results are displayed)  Labs Reviewed  CBC WITH DIFFERENTIAL/PLATELET  COMPREHENSIVE METABOLIC PANEL  URINALYSIS, COMPLETE (UACMP) WITH MICROSCOPIC  TROPONIN I  CBG MONITORING, ED    Pertinent labs  results that were available during my care of the patient were reviewed by me and considered in my medical decision making (see chart for details). ____________________________________________  EKG  I personally interpreted any EKGs ordered by me or triage EKG shows sinus rhythm, atrial enlargement, no acute ST elevation, borderline LAD, no significant change from prior EKG performed on January 03, 2018. ____________________________________________  RADIOLOGY  Pertinent labs & imaging results that were available during my care of the patient were reviewed by me and considered in my medical decision making (see chart for details). If possible, patient and/or family made aware of any abnormal findings.  No results found. ____________________________________________    PROCEDURES  Procedure(s) performed: None  Procedures  Critical Care performed: None  ____________________________________________   INITIAL IMPRESSION / ASSESSMENT AND PLAN / ED COURSE  Pertinent labs & imaging results that were available during my care of the patient were reviewed by me and considered in my medical decision making (see chart for details).  Here for a syncopal event, he has had cardiogenic syncope in the past with a bradycardic syncope, she is bradycardic at this  time heart rate is in the 90s and thus I enter the room which time it goes up to the low 100s.  Soon as I leave it comes down again.  Patient does have a history of anxiety.  She is quite well-appearing at this time and in no acute distress appears to be at her baseline for the extent that I can determine.  Certainly possible that she had a cardiogenic syncope also possible that she simply did not eat  or drink anything today and [facility] and had a vagal event preceded by feeling hot.  At no time did she have any chest pain.  We will send cardiac markers blood work including electrolytes, and obtain chest x-ray urinalysis and reassess.  ----------------------------------------- 6:13 PM on 07/23/2018 -----------------------------------------  Is evidence of urinary tract infection and states she has been having a slight amount of dysuria recently.  Otherwise she is awake and alert, eating and drinking here, we have given her fluids vital signs are reassuring, she adamant that he does not want to be admitted to the hospital.  We will check orthostatic vital signs, and reassess.  On the monitor she remains in sinus rhythm with no dysrhythmia noted.  Has had episodes like this before when she skips lunch and she feels that that is what is going on we will try to reassess her prior to discharge but she is adamant that she will not stay  ----------------------------------------- 6:27 PM on 07/23/2018 -----------------------------------------  Gust with her daughter who also is POA she is also very strong opinion the patient should go home they are concerned about coronavirus they do not want her in the hospital.  I do not think this is unreasonable, they all understand the limitations this places upon us in terms of further evaluation.   ____________________________________________   FINAL CLINICAL IMPRESSION(S) / ED DIAGNOSES  Final diagnoses:  Weakness      This chart was dictated  using voice recognition software.  Despite best efforts to proofread,  errors can occur which can change meaning.      Jeanmarie PlantMcShane, Rafik Koppel A, MD 07/23/18 1634    Jeanmarie PlantMcShane, Lachina Salsberry A, MD 07/23/18 [phone]

## 2018-07-23 NOTE — ED Notes (Signed)
Rivka Barbara, neighbor, will pick pt up at discharge [phone])

## 2018-07-23 NOTE — ED Triage Notes (Addendum)
Pt ems from [employer]. Per ems pt became weak and sat down and ems was called. EMS stated that pt went completely out x 2 when ems was on scene. Pt says that she feels weak but mentally normal.

## 2018-07-23 NOTE — Discharge Instructions (Signed)
Do not fail to drink plenty of fluids, if you change your mind about admission or you feel worse in any way including lightheadedness chest pain shortness of breath or other concerns return to the emergency department.

## 2018-07-24 LAB — URINE CULTURE: Culture: >=100000 — AB

## 2018-09-19 ENCOUNTER — Emergency Department: Payer: Medicare Other

## 2018-09-19 ENCOUNTER — Other Ambulatory Visit: Payer: Self-pay

## 2018-09-19 ENCOUNTER — Encounter: Payer: Self-pay | Admitting: Emergency Medicine

## 2018-09-19 ENCOUNTER — Emergency Department
Admission: EM | Admit: 2018-09-19 | Discharge: 2018-09-20 | Disposition: A | Discharge status: Home / Self Care | Payer: Medicare Other | Attending: Emergency Medicine | Admitting: Emergency Medicine

## 2018-09-19 DIAGNOSIS — Z96641 Presence of right artificial hip joint: Secondary | ICD-10-CM | POA: Insufficient documentation

## 2018-09-19 DIAGNOSIS — R1084 Generalized abdominal pain: Secondary | ICD-10-CM | POA: Diagnosis present

## 2018-09-19 DIAGNOSIS — R197 Diarrhea, unspecified: Secondary | ICD-10-CM | POA: Diagnosis not present

## 2018-09-19 DIAGNOSIS — Z96652 Presence of left artificial knee joint: Secondary | ICD-10-CM | POA: Diagnosis not present

## 2018-09-19 DIAGNOSIS — I10 Essential (primary) hypertension: Secondary | ICD-10-CM | POA: Insufficient documentation

## 2018-09-19 DIAGNOSIS — E039 Hypothyroidism, unspecified: Secondary | ICD-10-CM | POA: Insufficient documentation

## 2018-09-19 DIAGNOSIS — Z87891 Personal history of nicotine dependence: Secondary | ICD-10-CM | POA: Insufficient documentation

## 2018-09-19 DIAGNOSIS — Z79899 Other long term (current) drug therapy: Secondary | ICD-10-CM | POA: Diagnosis not present

## 2018-09-19 DIAGNOSIS — Z8673 Personal history of transient ischemic attack (TIA), and cerebral infarction without residual deficits: Secondary | ICD-10-CM | POA: Diagnosis not present

## 2018-09-19 LAB — CBC
HCT: 42.1 % (ref 36.0–46.0)
Hemoglobin: 13.5 g/dL (ref 12.0–15.0)
MCH: 28.3 pg (ref 26.0–34.0)
MCHC: 32.1 g/dL (ref 30.0–36.0)
MCV: 88.3 fL (ref 80.0–100.0)
Platelets: 242 10*3/uL (ref 150–400)
RBC: 4.77 MIL/uL (ref 3.87–5.11)
RDW: 14.5 % (ref 11.5–15.5)
WBC: 8 10*3/uL (ref 4.0–10.5)
nRBC: 0 % (ref 0.0–0.2)

## 2018-09-19 LAB — URINALYSIS, COMPLETE (UACMP) WITH MICROSCOPIC
Bacteria, UA: NONE SEEN
Bilirubin Urine: NEGATIVE
Glucose, UA: NEGATIVE mg/dL
Hgb urine dipstick: NEGATIVE
Ketones, ur: NEGATIVE mg/dL
Nitrite: NEGATIVE
Protein, ur: NEGATIVE mg/dL
Specific Gravity, Urine: 1.019 (ref 1.005–1.030)
pH: 6 (ref 5.0–8.0)

## 2018-09-19 LAB — COMPREHENSIVE METABOLIC PANEL
ALT: 15 U/L (ref 0–44)
AST: 19 U/L (ref 15–41)
Albumin: 3.8 g/dL (ref 3.5–5.0)
Alkaline Phosphatase: 74 U/L (ref 38–126)
Anion gap: 9 (ref 5–15)
BUN: 22 mg/dL (ref 8–23)
CO2: 26 mmol/L (ref 22–32)
Calcium: 9.4 mg/dL (ref 8.9–10.3)
Chloride: 102 mmol/L (ref 98–111)
Creatinine, Ser: 0.79 mg/dL (ref 0.44–1.00)
GFR calc Af Amer: >60 mL/min (ref >60)
GFR calc non Af Amer: >60 mL/min (ref >60)
Glucose, Bld: 103 mg/dL — ABNORMAL HIGH (ref 70–99)
Potassium: 4.1 mmol/L (ref 3.5–5.1)
Sodium: 137 mmol/L (ref 135–145)
Total Bilirubin: 0.9 mg/dL (ref 0.3–1.2)
Total Protein: 6.6 g/dL (ref 6.5–8.1)

## 2018-09-19 LAB — LIPASE, BLOOD: Lipase: 39 U/L (ref 11–51)

## 2018-09-19 MED ORDER — SODIUM CHLORIDE 0.9% FLUSH: 3.0000 mL | Freq: Once | INTRAVENOUS | Status: DC

## 2018-09-19 MED ORDER — SODIUM CHLORIDE 0.9 % IV BOLUS
500.0000 mL | Freq: Once | INTRAVENOUS | Status: AC
  Administered 2018-09-19: 500 mL via INTRAVENOUS

## 2018-09-19 MED ORDER — IOHEXOL 300 MG/ML  SOLN
100.0000 mL | Freq: Once | INTRAMUSCULAR | Status: AC | PRN
  Administered 2018-09-19: 100 mL via INTRAVENOUS

## 2018-09-19 NOTE — ED Provider Notes (Addendum)
[facility] Emergency Department Provider Note  ____________________________________________  Time seen: Approximately 10:37 PM  I have reviewed the triage vital signs and the nursing notes.   HISTORY  Chief Complaint Abdominal Pain    HPI Kerry Gentileatti B Wahlert is a 85 y.o. female with a history of CAD hypertension who comes to the ED complaining of generalized abdominal pain for the past 5 days.  No nausea or vomiting.  She does have some loose stools.  Denies black or bloody stool or history of GI bleed.  Not on any blood thinners.  Symptoms have been constant for the past 5 days, no aggravating or alleviating factors.  Nonradiating.  Reports that she is eating and drinking normally.    Past Medical History:  Diagnosis Date  . Arthritis   . Carotid artery occlusion   . Complication of anesthesia    very confused for days  . Coronary artery disease   . Depression   . Glaucoma   . Hyperlipemia   . Hypertension    h/o not on medication  . Hypothyroidism   . Insomnia   . PUD (peptic ulcer disease)    in distant past  . TIA (transient ischemic attack)      Patient Active Problem List   Diagnosis Date Noted  . Symptomatic bradycardia 11/05/2017  . Abnormal gait 12/17/2016  . Avascular necrosis of femoral head (HCC) 12/17/2016  . Bursitis of hip 12/17/2016  . CAD (coronary artery disease) 12/17/2016  . Closed fracture of intracapsular section of femur (HCC) 12/17/2016  . Closed transcervical fracture of femur (HCC) 12/17/2016  . Hip pain 12/17/2016  . Inflammation of sacroiliac joint (HCC) 12/17/2016  . Osteoarthritis of hip 12/17/2016  . Osteoarthritis of knee 12/17/2016  . Post-traumatic osteoarthritis 12/17/2016  . Diarrhea 09/20/2016  . Hiatal hernia 09/12/2016  . Acquired hypothyroidism 07/04/2016  . S/P total knee arthroplasty 08/08/2015  . Moderate mitral insufficiency 12/20/2014  . 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  . Hyperlipidemia, mixed  07/09/2013     Past Surgical History:  Procedure Laterality Date  . CARDIAC CATHETERIZATION    . KNEE ARTHROPLASTY Left 08/08/2015   Procedure: COMPUTER ASSISTED TOTAL KNEE ARTHROPLASTY;  Surgeon: Donato HeinzJames P Hooten, MD;  Location: [facility] ORS;  Service: Orthopedics;  Laterality: Left;  . KNEE ARTHROSCOPY    . Percutaneous pinning of right femoral neck fracture    . TONSILLECTOMY    . TOTAL HIP ARTHROPLASTY Right 02/06/2014   Dr. Deeann SaintHoward Miller     Prior to Admission medications   Medication Sig Start Date End Date Taking? Authorizing Provider  ALPRAZolam Prudy Feeler(XANAX) 0.5 MG tablet Take 0.5 mg by mouth at bedtime as needed for anxiety.    [provider]  Cyanocobalamin (VITAMIN B 12 PO) Take 1,000 mcg by mouth daily.    [provider]  donepezil (ARICEPT) 5 MG tablet Take 5 mg by mouth at bedtime.    [provider]  escitalopram (LEXAPRO) 10 MG tablet Take 10 mg by mouth daily.    [provider]  levothyroxine (SYNTHROID, LEVOTHROID) 112 MCG tablet Take 112 mcg by mouth daily before breakfast.    [provider]  LORazepam (ATIVAN) 0.5 MG tablet Take 0.5 mg by mouth at bedtime.    [provider]  Multiple Vitamin (MULTIVITAMIN) tablet Take 1 tablet by mouth daily.    [provider]  oxyCODONE (OXY IR/ROXICODONE) 5 MG immediate release tablet Take 1-2 tablets (5-10 mg total) by mouth every 3 (three) hours as  needed for severe pain or breakthrough pain. Patient not taking: Reported on 06/25/2016 08/10/15   Tera PartridgeWolfe, Jon R, PA  predniSONE (DELTASONE) 1 MG tablet Take 4 mg by mouth daily with breakfast.    [provider]  predniSONE (DELTASONE) 5 MG tablet Take 5 mg by mouth daily with breakfast.    [provider]  senna (SENOKOT) 8.6 MG tablet Take 1 tablet by mouth daily as needed for constipation. Reported on 08/08/2015    [provider]  solifenacin (VESICARE) 5 MG tablet Take 1 tablet (5 mg total) by mouth  daily. Patient not taking: Reported on 11/05/2017 06/25/16   Alfredo MartinezMacDiarmid, Scott, MD  traMADol (ULTRAM) 50 MG tablet Take 1-2 tablets (50-100 mg total) by mouth every 4 (four) hours as needed for moderate pain. Patient not taking: Reported on 06/25/2016 08/10/15   Tera PartridgeWolfe, Jon R, PA  venlafaxine XR (EFFEXOR-XR) 37.5 MG 24 hr capsule Take 37.5 mg by mouth daily with breakfast.    [provider]     Allergies Ace inhibitors, Codeine, Gabapentin, Micardis hct [telmisartan-hctz], Prednisone, Sulfa antibiotics, and Zithromax [azithromycin]   Family History  Problem Relation Age of Onset  . Prostate cancer Son   . Kidney disease Neg Hx   . Kidney cancer Neg Hx     Social History Social History   Tobacco Use  . Smoking status: Former Smoker    Years: 12.00  . Smokeless tobacco: Never Used  Substance Use Topics  . Alcohol use: Yes    Comment: osccasional  . Drug use: No    Review of Systems  Constitutional:   No fever or chills.  ENT:   No sore throat. No rhinorrhea. Cardiovascular:   No chest pain or syncope. Respiratory:   No dyspnea or cough. Gastrointestinal:   Positive as above abdominal pain without vomiting constipation or significant diarrhea.  Musculoskeletal:   Negative for focal pain or swelling All other systems reviewed and are negative except as documented above in ROS and HPI.  ____________________________________________   PHYSICAL EXAM:  VITAL SIGNS: ED Triage Vitals [09/19/18 1831]  Enc Vitals Group     BP 111/64     Pulse Rate 65     Resp 14     Temp 98.5 F (36.9 C)     Temp Source Oral     SpO2 96 %     Weight 140 lb (63.5 kg)     Height 5\' 6"  (1.676 m)     Head Circumference      Peak Flow      Pain Score 6     Pain Loc      Pain Edu?      Excl. in GC?     Vital signs reviewed, nursing assessments reviewed.   Constitutional:   Alert and oriented. Non-toxic appearance. Eyes:   Conjunctivae are normal. EOMI. PERRL. ENT      Head:    Normocephalic and atraumatic.      Nose:   No congestion/rhinnorhea.       Mouth/Throat:   MMM, no pharyngeal erythema. No peritonsillar mass.       Neck:   No meningismus. Full ROM. Hematological/Lymphatic/Immunilogical:   No cervical lymphadenopathy. Cardiovascular:   RRR. Symmetric bilateral radial and DP pulses.  No murmurs. Cap refill less than 2 seconds. Respiratory:   Normal respiratory effort without tachypnea/retractions. Breath sounds are clear and equal bilaterally. No wheezes/rales/rhonchi. Gastrointestinal:   Soft with mild generalized tenderness, nonfocal. Non distended. There is no  CVA tenderness.  No rebound, rigidity, or guarding. Musculoskeletal:   Normal range of motion in all extremities. No joint effusions.  No lower extremity tenderness.  No edema. Neurologic:   Normal speech and language.  Motor grossly intact. No acute focal neurologic deficits are appreciated.  Skin:    Skin is warm, dry and intact. No rash noted.  No petechiae, purpura, or bullae.  ____________________________________________    LABS (pertinent positives/negatives) (all labs ordered are listed, but only abnormal results are displayed) Labs Reviewed  COMPREHENSIVE METABOLIC PANEL - Abnormal; Notable for the following components:      Result Value   Glucose, Bld 103 (*)    All other components within normal limits  URINALYSIS, COMPLETE (UACMP) WITH MICROSCOPIC - Abnormal; Notable for the following components:   Color, Urine STRAW (*)    APPearance CLEAR (*)    Leukocytes,Ua TRACE (*)    All other components within normal limits  LIPASE, BLOOD  CBC   ____________________________________________   EKG    ____________________________________________    RADIOLOGY  Ct Abdomen Pelvis W Contrast  Result Date: 09/19/2018 CLINICAL DATA:  Abdominal pain EXAM: CT ABDOMEN AND PELVIS WITH CONTRAST TECHNIQUE: Multidetector CT imaging of the abdomen and pelvis was performed using the standard  protocol following bolus administration of intravenous contrast. CONTRAST:  100mL OMNIPAQUE IOHEXOL 300 MG/ML  SOLN COMPARISON:  02/16/2016 FINDINGS: Lower chest: Large hiatal hernia with much of the stomach in the right lower chest, stable. No acute abnormality. Hepatobiliary: 1.9 cm gallstone within the gallbladder. No focal hepatic abnormality or biliary ductal dilatation. Pancreas: Much of the pancreas is located within the hernia and the right lower chest. Mild ductal dilatation. No visible focal abnormality. Spleen: No focal abnormality.  Normal size. Adrenals/Urinary Tract: No adrenal abnormality. No focal renal abnormality. No stones or hydronephrosis. Urinary bladder is unremarkable. Stomach/Bowel: Diffuse colonic diverticulosis, most pronounced in the sigmoid colon. No active diverticulitis. No evidence of bowel obstruction. Vascular/Lymphatic: Aortic atherosclerosis. No enlarged abdominal or pelvic lymph nodes. Reproductive: Uterus and adnexa unremarkable.  No mass. Other: No free fluid or free air. Musculoskeletal: Right hip replacement.  No acute bony abnormality. IMPRESSION: Large hiatal hernia containing much of the stomach and pancreas located in the right lower chest. Cholelithiasis. Colonic diverticulosis.  No active diverticulitis. Aortic atherosclerosis. No acute findings in the abdomen or pelvis. Electronically Signed   By: Charlett NoseKevin  Dover M.D.   On: 09/19/2018 22:23    ____________________________________________   PROCEDURES Procedures  ____________________________________________  DIFFERENTIAL DIAGNOSIS   Cystitis, gastritis, diverticulitis, bowel obstruction, perforation  CLINICAL IMPRESSION / ASSESSMENT AND PLAN / ED COURSE  Medications ordered in the ED: Medications  sodium chloride flush (NS) 0.9 % injection 3 mL (0 mLs Intravenous Hold 09/19/18 2022)  sodium chloride 0.9 % bolus 500 mL (500 mLs Intravenous New Bag/Given 09/19/18 2140)  iohexol (OMNIPAQUE) 300 MG/ML  solution 100 mL (100 mLs Intravenous Contrast Given 09/19/18 2155)  sodium chloride 0.9 % bolus 500 mL (500 mLs Intravenous New Bag/Given 09/19/18 2248)    Pertinent labs & imaging results that were available during my care of the patient were reviewed by me and considered in my medical decision making (see chart for details).  Kerry Cabrera was evaluated in Emergency Department on 09/19/2018 for the symptoms described in the history of present illness. She was evaluated in the context of the global COVID-19 pandemic, which necessitated consideration that the patient might be at risk for infection with the SARS-CoV-2 virus that causes COVID-19.  Institutional protocols and algorithms that pertain to the evaluation of patients at risk for COVID-19 are in a state of rapid change based on information released by regulatory bodies including the CDC and federal and state organizations. These policies and algorithms were followed during the patient's care in the ED.   Patient presents with generalized abdominal pain, mild tenderness.  She is well-appearing, nontoxic, normal vital signs.  Doubt mesenteric ischemia dissection AAA.  Labs unremarkable.  Will obtain CT scan due to broad differential, check urinalysis.  ----------------------------------------- 10:39 PM on 09/19/2018 -----------------------------------------  CT unremarkable, serum labs unremarkable.  Need to follow-up on urinalysis after which patient can be discharged home.  ----------------------------------------- 11:29 PM on 09/19/2018 -----------------------------------------  ua unremarkable. Tolerating PO.       ____________________________________________   FINAL CLINICAL IMPRESSION(S) / ED DIAGNOSES    Final diagnoses:  Generalized abdominal pain     ED Discharge Orders    None      Portions of this note were generated with dragon dictation software. Dictation errors may occur despite best attempts at  proofreading.   Carrie Mew, MD 09/19/18 [place]    Carrie Mew, MD 09/19/18 2330

## 2018-09-19 NOTE — Discharge Instructions (Addendum)
Your labs and CT scan were all okay today. Continue taking all your medications, and please follow up with your doctor in 3 days to reassess your symptoms.

## 2018-09-19 NOTE — ED Triage Notes (Signed)
Pt to ED from home c/o abd pain since Tuesday night, denies n/v but some diarrhea, denies urinary symptoms.  Pt states pain is like "a whole bunch of animals down there and they're fighting".  Pt skin WNL, chest rise even and unlabored, in NAD at this time.

## 2018-09-20 NOTE — ED Notes (Signed)
Pt verbalizes understanding of discharge instructions.

## 2018-11-03 ENCOUNTER — Other Ambulatory Visit: Payer: Self-pay

## 2018-11-03 ENCOUNTER — Emergency Department
Admission: EM | Admit: 2018-11-03 | Discharge: 2018-11-03 | Disposition: A | Discharge status: Home / Self Care | Payer: Medicare Other | Attending: Emergency Medicine | Admitting: Emergency Medicine

## 2018-11-03 ENCOUNTER — Encounter: Payer: Self-pay | Admitting: *Deleted

## 2018-11-03 DIAGNOSIS — Z8673 Personal history of transient ischemic attack (TIA), and cerebral infarction without residual deficits: Secondary | ICD-10-CM | POA: Insufficient documentation

## 2018-11-03 DIAGNOSIS — R109 Unspecified abdominal pain: Secondary | ICD-10-CM | POA: Diagnosis present

## 2018-11-03 DIAGNOSIS — F419 Anxiety disorder, unspecified: Secondary | ICD-10-CM | POA: Insufficient documentation

## 2018-11-03 DIAGNOSIS — R197 Diarrhea, unspecified: Secondary | ICD-10-CM | POA: Insufficient documentation

## 2018-11-03 DIAGNOSIS — Z79899 Other long term (current) drug therapy: Secondary | ICD-10-CM | POA: Diagnosis not present

## 2018-11-03 DIAGNOSIS — Z96652 Presence of left artificial knee joint: Secondary | ICD-10-CM | POA: Insufficient documentation

## 2018-11-03 DIAGNOSIS — Z87891 Personal history of nicotine dependence: Secondary | ICD-10-CM | POA: Diagnosis not present

## 2018-11-03 DIAGNOSIS — I10 Essential (primary) hypertension: Secondary | ICD-10-CM | POA: Insufficient documentation

## 2018-11-03 DIAGNOSIS — E039 Hypothyroidism, unspecified: Secondary | ICD-10-CM | POA: Diagnosis not present

## 2018-11-03 LAB — COMPREHENSIVE METABOLIC PANEL
ALT: 13 U/L (ref 0–44)
AST: 19 U/L (ref 15–41)
Albumin: 3.6 g/dL (ref 3.5–5.0)
Alkaline Phosphatase: 69 U/L (ref 38–126)
Anion gap: 8 (ref 5–15)
BUN: 19 mg/dL (ref 8–23)
CO2: 26 mmol/L (ref 22–32)
Calcium: 8.9 mg/dL (ref 8.9–10.3)
Chloride: 106 mmol/L (ref 98–111)
Creatinine, Ser: 0.71 mg/dL (ref 0.44–1.00)
GFR calc Af Amer: >60 mL/min (ref >60)
GFR calc non Af Amer: >60 mL/min (ref >60)
Glucose, Bld: 114 mg/dL — ABNORMAL HIGH (ref 70–99)
Potassium: 4.3 mmol/L (ref 3.5–5.1)
Sodium: 140 mmol/L (ref 135–145)
Total Bilirubin: 0.7 mg/dL (ref 0.3–1.2)
Total Protein: 6.7 g/dL (ref 6.5–8.1)

## 2018-11-03 LAB — LIPASE, BLOOD: Lipase: 76 U/L — ABNORMAL HIGH (ref 11–51)

## 2018-11-03 LAB — CBC
HCT: 39.2 % (ref 36.0–46.0)
Hemoglobin: 12.7 g/dL (ref 12.0–15.0)
MCH: 28.5 pg (ref 26.0–34.0)
MCHC: 32.4 g/dL (ref 30.0–36.0)
MCV: 88.1 fL (ref 80.0–100.0)
Platelets: 282 10*3/uL (ref 150–400)
RBC: 4.45 MIL/uL (ref 3.87–5.11)
RDW: 13.6 % (ref 11.5–15.5)
WBC: 5.3 10*3/uL (ref 4.0–10.5)
nRBC: 0 % (ref 0.0–0.2)

## 2018-11-03 MED ORDER — LORAZEPAM 0.5 MG PO TABS: 0.5000 mg | ORAL_TABLET | Freq: Two times a day (BID) | ORAL | 0 refills | Status: AC | PRN

## 2018-11-03 MED ORDER — DICYCLOMINE HCL 20 MG PO TABS: 20.0000 mg | ORAL_TABLET | Freq: Three times a day (TID) | ORAL | 0 refills | Status: DC | PRN

## 2018-11-03 NOTE — ED Triage Notes (Signed)
Pt to Ed reporting intermittent diarrhea for the past month. Pt reports she has called PCP multiple times and is given a  Pill that helps for a short while and then the diarrhea returns and generalized abd "soreness." Pt reports she is starting to feel fatigued.   No nausea or vomiting and no changes in urine. No fevers.

## 2018-11-03 NOTE — ED Provider Notes (Signed)
[facility] Emergency Department Provider Note   ____________________________________________    I have reviewed the triage vital signs and the nursing notes.   HISTORY  Chief Complaint Abdominal Pain and Diarrhea     HPI Kerry Cabrera is a 85 y.o. female who presents with complaints of intermittent abdominal cramps and diarrhea over several weeks.  She has been working with GI and her PCP but is frustrated because things are not moving fast enough for her.  Currently she feels well and has no significant complaints.  She does report that she is having significant anxiety and that this is a terrible time  because of the pandemic that she is coping as best she can.  No fevers or chills.  No nausea or vomiting.  Has had some relief with sucralfate   Past Medical History:  Diagnosis Date  . Arthritis   . Carotid artery occlusion   . Complication of anesthesia    very confused for days  . Coronary artery disease   . Depression   . Glaucoma   . Hyperlipemia   . Hypertension    h/o not on medication  . Hypothyroidism   . Insomnia   . PUD (peptic ulcer disease)    in distant past  . TIA (transient ischemic attack)     Patient Active Problem List   Diagnosis Date Noted  . Symptomatic bradycardia 11/05/2017  . Abnormal gait 12/17/2016  . Avascular necrosis of femoral head ([place]) 12/17/2016  . Bursitis of hip 12/17/2016  . CAD (coronary artery disease) 12/17/2016  . Closed fracture of intracapsular section of femur ([place]) 12/17/2016  . Closed transcervical fracture of femur ([place]) 12/17/2016  . Hip pain 12/17/2016  . Inflammation of sacroiliac joint ([place]) 12/17/2016  . Osteoarthritis of hip 12/17/2016  . Osteoarthritis of knee 12/17/2016  . Post-traumatic osteoarthritis 12/17/2016  . Diarrhea 09/20/2016  . Hiatal hernia 09/12/2016  . Acquired hypothyroidism 07/04/2016  . S/P total knee arthroplasty 08/08/2015  . Moderate mitral  insufficiency 12/20/2014  . 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  . Hyperlipidemia, mixed 07/09/2013    Past Surgical History:  Procedure Laterality Date  . CARDIAC CATHETERIZATION    . KNEE ARTHROPLASTY Left 08/08/2015   Procedure: COMPUTER ASSISTED TOTAL KNEE ARTHROPLASTY;  Surgeon: Dereck Leep, MD;  Location: [facility] ORS;  Service: Orthopedics;  Laterality: Left;  . KNEE ARTHROSCOPY    . Percutaneous pinning of right femoral neck fracture    . TONSILLECTOMY    . TOTAL HIP ARTHROPLASTY Right 02/06/2014   Dr. Earnestine Leys    Prior to Admission medications   Medication Sig Start Date End Date Taking? Authorizing Provider  ALPRAZolam Duanne Moron) 0.5 MG tablet Take 0.5 mg by mouth at bedtime as needed for anxiety.    [provider]  Cyanocobalamin (VITAMIN B 12 PO) Take 1,000 mcg by mouth daily.    [provider]  dicyclomine (BENTYL) 20 MG tablet Take 1 tablet (20 mg total) by mouth 3 (three) times daily as needed for spasms. 11/03/18 11/03/19  Lavonia Drafts, MD  donepezil (ARICEPT) 5 MG tablet Take 5 mg by mouth at bedtime.    [provider]  escitalopram (LEXAPRO) 10 MG tablet Take 10 mg by mouth daily.    [provider]  levothyroxine (SYNTHROID, LEVOTHROID) 112 MCG tablet Take 112 mcg by mouth daily before breakfast.    [provider]  LORazepam (ATIVAN) 0.5 MG tablet Take 1 tablet (0.5 mg total) by mouth every 12 (  twelve) hours as needed for up to 6 days for anxiety. 11/03/18 11/09/18  Jene EveryKinner, Darcel Frane, MD  Multiple Vitamin (MULTIVITAMIN) tablet Take 1 tablet by mouth daily.    [provider]  oxyCODONE (OXY IR/ROXICODONE) 5 MG immediate release tablet Take 1-2 tablets (5-10 mg total) by mouth every 3 (three) hours as needed for severe pain or breakthrough pain. Patient not taking: Reported on 06/25/2016 08/10/15   Tera PartridgeWolfe, Jon R, PA  predniSONE (DELTASONE) 1 MG tablet Take 4 mg by mouth daily with breakfast.    [provider]  predniSONE  (DELTASONE) 5 MG tablet Take 5 mg by mouth daily with breakfast.    [provider]  senna (SENOKOT) 8.6 MG tablet Take 1 tablet by mouth daily as needed for constipation. Reported on 08/08/2015    [provider]  solifenacin (VESICARE) 5 MG tablet Take 1 tablet (5 mg total) by mouth daily. Patient not taking: Reported on 11/05/2017 06/25/16   Alfredo MartinezMacDiarmid, Scott, MD  traMADol (ULTRAM) 50 MG tablet Take 1-2 tablets (50-100 mg total) by mouth every 4 (four) hours as needed for moderate pain. Patient not taking: Reported on 06/25/2016 08/10/15   Tera PartridgeWolfe, Jon R, PA  venlafaxine XR (EFFEXOR-XR) 37.5 MG 24 hr capsule Take 37.5 mg by mouth daily with breakfast.    [provider]     Allergies Ace inhibitors, Codeine, Gabapentin, Micardis hct [telmisartan-hctz], Prednisone, Sulfa antibiotics, and Zithromax [azithromycin]  Family History  Problem Relation Age of Onset  . Prostate cancer Son   . Kidney disease Neg Hx   . Kidney cancer Neg Hx     Social History Social History   Tobacco Use  . Smoking status: Former Smoker    Years: 12.00  . Smokeless tobacco: Never Used  Substance Use Topics  . Alcohol use: Yes    Comment: osccasional  . Drug use: No    Review of Systems  Constitutional: No fever/chills Eyes: No visual changes.  ENT: No sore throat. Cardiovascular: Denies chest pain. Respiratory: Denies shortness of breath. Gastrointestinal: As above Genitourinary: Negative for dysuria. Musculoskeletal: Negative for back pain. Skin: Negative for rash. Neurological: Negative for headaches    ____________________________________________   PHYSICAL EXAM:  VITAL SIGNS: ED Triage Vitals  Enc Vitals Group     BP 11/03/18 1129 (!) 152/71     Pulse Rate 11/03/18 1129 60     Resp 11/03/18 1129 16     Temp 11/03/18 1129 99 F (37.2 C)     Temp Source 11/03/18 1129 Oral     SpO2 11/03/18 1129 96 %     Weight 11/03/18 1130 63.5 kg (140 lb)     Height 11/03/18  1130 1.651 m (5\' 5" )     Head Circumference --      Peak Flow --      Pain Score 11/03/18 1130 3     Pain Loc --      Pain Edu? --      Excl. in GC? --     Constitutional: Alert No acute distress. Pleasant and interactive  Nose: No congestion/rhinnorhea. Mouth/Throat: Mucous membranes are moist.    Cardiovascular: Normal rate, regular rhythm. Grossly normal heart sounds.  Good peripheral circulation. Respiratory: Normal respiratory effort.  No retractions. Lungs CTAB. Gastrointestinal: Soft and nontender. No distention.  No CVA tenderness.  Reassuring exam  Musculoskeletal:  Warm and well perfused Neurologic:  Normal speech and language. No gross focal neurologic deficits are appreciated.  Skin:  Skin is warm,  dry and intact. No rash noted. Psychiatric: Mood and affect are normal. Speech and behavior are normal.  ____________________________________________   LABS (all labs ordered are listed, but only abnormal results are displayed)  Labs Reviewed  LIPASE, BLOOD - Abnormal; Notable for the following components:      Result Value   Lipase 76 (*)    All other components within normal limits  COMPREHENSIVE METABOLIC PANEL - Abnormal; Notable for the following components:   Glucose, Bld 114 (*)    All other components within normal limits  CBC  URINALYSIS, COMPLETE (UACMP) WITH MICROSCOPIC   ____________________________________________  EKG  None ____________________________________________  RADIOLOGY   ____________________________________________   PROCEDURES  Procedure(s) performed: No  Procedures   Critical Care performed: No ____________________________________________   INITIAL IMPRESSION / ASSESSMENT AND PLAN / ED COURSE  Pertinent labs & imaging results that were available during my care of the patient were reviewed by me and considered in my medical decision making (see chart for details).  Patient is being worked up for diarrhea by GI, her  abdominal exam is quite reassuring, lab work here is unremarkable, no epigastric tenderness palpation.  In fact she does not seem to have any abdominal pain today.  We discussed a trial of Bentyl, will prescribe short dose of Ativan for her anxiety, close follow-up with PCP and GI.  Return precautions discussed.    ____________________________________________   FINAL CLINICAL IMPRESSION(S) / ED DIAGNOSES  Final diagnoses:  Diarrhea, unspecified type        Note:  This document was prepared using Dragon voice recognition software and may include unintentional dictation errors.   Jene EveryKinner, Yehoshua Vitelli, MD 11/03/18 1459

## 2018-11-07 ENCOUNTER — Other Ambulatory Visit
Admission: RE | Admit: 2018-11-07 | Discharge: 2018-11-07 | Disposition: A | Discharge status: Home / Self Care | Payer: Medicare Other | Source: Ambulatory Visit | Attending: Nurse Practitioner | Admitting: Nurse Practitioner

## 2018-11-07 DIAGNOSIS — R109 Unspecified abdominal pain: Secondary | ICD-10-CM | POA: Diagnosis present

## 2018-11-07 DIAGNOSIS — R197 Diarrhea, unspecified: Secondary | ICD-10-CM | POA: Diagnosis present

## 2018-11-07 LAB — GASTROINTESTINAL PANEL BY PCR, STOOL (REPLACES STOOL CULTURE)

## 2018-11-07 LAB — C DIFFICILE QUICK SCREEN W PCR REFLEX
C Diff antigen: NEGATIVE
C Diff interpretation: NOT DETECTED
C Diff toxin: NEGATIVE

## 2018-11-17 LAB — CALPROTECTIN, FECAL: Calprotectin, Fecal: 130 ug/g — ABNORMAL HIGH (ref 0–120)

## 2018-12-06 ENCOUNTER — Emergency Department: Payer: Medicare Other

## 2018-12-06 ENCOUNTER — Emergency Department
Admission: EM | Admit: 2018-12-06 | Discharge: 2018-12-06 | Disposition: A | Discharge status: Home / Self Care | Payer: Medicare Other | Attending: Emergency Medicine | Admitting: Emergency Medicine

## 2018-12-06 ENCOUNTER — Other Ambulatory Visit: Payer: Self-pay

## 2018-12-06 DIAGNOSIS — Z79899 Other long term (current) drug therapy: Secondary | ICD-10-CM | POA: Diagnosis not present

## 2018-12-06 DIAGNOSIS — R5381 Other malaise: Secondary | ICD-10-CM

## 2018-12-06 DIAGNOSIS — E039 Hypothyroidism, unspecified: Secondary | ICD-10-CM | POA: Diagnosis not present

## 2018-12-06 DIAGNOSIS — R5383 Other fatigue: Secondary | ICD-10-CM | POA: Insufficient documentation

## 2018-12-06 DIAGNOSIS — Z87891 Personal history of nicotine dependence: Secondary | ICD-10-CM | POA: Diagnosis not present

## 2018-12-06 DIAGNOSIS — I2510 Atherosclerotic heart disease of native coronary artery without angina pectoris: Secondary | ICD-10-CM | POA: Diagnosis not present

## 2018-12-06 DIAGNOSIS — I10 Essential (primary) hypertension: Secondary | ICD-10-CM | POA: Diagnosis not present

## 2018-12-06 DIAGNOSIS — R51 Headache: Secondary | ICD-10-CM | POA: Insufficient documentation

## 2018-12-06 DIAGNOSIS — R519 Headache, unspecified: Secondary | ICD-10-CM

## 2018-12-06 DIAGNOSIS — R531 Weakness: Secondary | ICD-10-CM | POA: Insufficient documentation

## 2018-12-06 LAB — CBC
HCT: 38.3 % (ref 36.0–46.0)
Hemoglobin: 12.7 g/dL (ref 12.0–15.0)
MCH: 29 pg (ref 26.0–34.0)
MCHC: 33.2 g/dL (ref 30.0–36.0)
MCV: 87.4 fL (ref 80.0–100.0)
Platelets: 244 10*3/uL (ref 150–400)
RBC: 4.38 MIL/uL (ref 3.87–5.11)
RDW: 13.2 % (ref 11.5–15.5)
WBC: 6.4 10*3/uL (ref 4.0–10.5)
nRBC: 0 % (ref 0.0–0.2)

## 2018-12-06 LAB — URINALYSIS, COMPLETE (UACMP) WITH MICROSCOPIC
Bacteria, UA: NONE SEEN
Bilirubin Urine: NEGATIVE
Glucose, UA: NEGATIVE mg/dL
Hgb urine dipstick: NEGATIVE
Ketones, ur: NEGATIVE mg/dL
Nitrite: NEGATIVE
Protein, ur: NEGATIVE mg/dL
Specific Gravity, Urine: 1.005 (ref 1.005–1.030)
pH: 7 (ref 5.0–8.0)

## 2018-12-06 LAB — COMPREHENSIVE METABOLIC PANEL
ALT: 13 U/L (ref 0–44)
AST: 19 U/L (ref 15–41)
Albumin: 3.6 g/dL (ref 3.5–5.0)
Alkaline Phosphatase: 62 U/L (ref 38–126)
Anion gap: 9 (ref 5–15)
BUN: 13 mg/dL (ref 8–23)
CO2: 26 mmol/L (ref 22–32)
Calcium: 9.1 mg/dL (ref 8.9–10.3)
Chloride: 101 mmol/L (ref 98–111)
Creatinine, Ser: 0.71 mg/dL (ref 0.44–1.00)
GFR calc Af Amer: >60 mL/min (ref >60)
GFR calc non Af Amer: >60 mL/min (ref >60)
Glucose, Bld: 96 mg/dL (ref 70–99)
Potassium: 4 mmol/L (ref 3.5–5.1)
Sodium: 136 mmol/L (ref 135–145)
Total Bilirubin: 0.4 mg/dL (ref 0.3–1.2)
Total Protein: 6.3 g/dL — ABNORMAL LOW (ref 6.5–8.1)

## 2018-12-06 LAB — LIPASE, BLOOD: Lipase: 42 U/L (ref 11–51)

## 2018-12-06 MED ORDER — DIPHENHYDRAMINE HCL 50 MG/ML IJ SOLN
25.0000 mg | Freq: Once | INTRAMUSCULAR | Status: AC
  Administered 2018-12-06: 25 mg via INTRAVENOUS
  Filled 2018-12-06: qty 1

## 2018-12-06 MED ORDER — KETOROLAC TROMETHAMINE 30 MG/ML IJ SOLN
15.0000 mg | Freq: Once | INTRAMUSCULAR | Status: AC
  Administered 2018-12-06: 15 mg via INTRAVENOUS
  Filled 2018-12-06: qty 1

## 2018-12-06 MED ORDER — PROCHLORPERAZINE EDISYLATE 10 MG/2ML IJ SOLN
10.0000 mg | Freq: Once | INTRAMUSCULAR | Status: AC
  Administered 2018-12-06: 10 mg via INTRAVENOUS
  Filled 2018-12-06: qty 2

## 2018-12-06 MED ORDER — LACTATED RINGERS IV BOLUS
1000.0000 mL | Freq: Once | INTRAVENOUS | Status: AC
  Administered 2018-12-06: 1000 mL via INTRAVENOUS

## 2018-12-06 NOTE — ED Notes (Signed)
Report given to Dee, RN 

## 2018-12-06 NOTE — ED Triage Notes (Signed)
Pt arrives via EMS from home after havign weakness, headache, and abdominal pain x1 month- called her doctor today as the headache was worse and the doctor told her to call EMS- pt states she has had "a little diarrhea" but denies n/v

## 2018-12-06 NOTE — ED Provider Notes (Signed)
[facility] Emergency Department Provider Note   ____________________________________________   First MD Initiated Contact with Patient 12/06/18 1857     (approximate)  I have reviewed the triage vital signs and the nursing notes.   HISTORY  Chief Complaint Abdominal Pain    HPI Kerry Cabrera is a 85 y.o. female with past medical history of CAD, hypertension, anxiety, who presents to the ED complaining of headache and generalized weakness.  Patient reports over the course of today she has had a diffuse throbbing headache that has gradually worsened.  She denies any history of headaches, does state current headache is exacerbated by bright lights and she has been feeling nauseous, but has not vomited.  She denies any fevers or neck stiffness.  She does report she has been feeling generally weak and malaised.  She complains of diarrhea and generalized abdominal discomfort.  She has not had any cough, chest pain, or shortness of breath.  She states she has not taken anything for the symptoms at home.        Past Medical History:  Diagnosis Date  . Arthritis   . Carotid artery occlusion   . Complication of anesthesia    very confused for days  . Coronary artery disease   . Depression   . Glaucoma   . Hyperlipemia   . Hypertension    h/o not on medication  . Hypothyroidism   . Insomnia   . PUD (peptic ulcer disease)    in distant past  . TIA (transient ischemic attack)     Patient Active Problem List   Diagnosis Date Noted  . Symptomatic bradycardia 11/05/2017  . Abnormal gait 12/17/2016  . Avascular necrosis of femoral head (HCC) 12/17/2016  . Bursitis of hip 12/17/2016  . CAD (coronary artery disease) 12/17/2016  . Closed fracture of intracapsular section of femur (HCC) 12/17/2016  . Closed transcervical fracture of femur (HCC) 12/17/2016  . Hip pain 12/17/2016  . Inflammation of sacroiliac joint (HCC) 12/17/2016  . Osteoarthritis of hip  12/17/2016  . Osteoarthritis of knee 12/17/2016  . Post-traumatic osteoarthritis 12/17/2016  . Diarrhea 09/20/2016  . Hiatal hernia 09/12/2016  . Acquired hypothyroidism 07/04/2016  . S/P total knee arthroplasty 08/08/2015  . Moderate mitral insufficiency 12/20/2014  . 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  . Hyperlipidemia, mixed 07/09/2013    Past Surgical History:  Procedure Laterality Date  . CARDIAC CATHETERIZATION    . KNEE ARTHROPLASTY Left 08/08/2015   Procedure: COMPUTER ASSISTED TOTAL KNEE ARTHROPLASTY;  Surgeon: Donato Heinz, MD;  Location: [facility] ORS;  Service: Orthopedics;  Laterality: Left;  . KNEE ARTHROSCOPY    . Percutaneous pinning of right femoral neck fracture    . TONSILLECTOMY    . TOTAL HIP ARTHROPLASTY Right 02/06/2014   Dr. Deeann Saint    Prior to Admission medications   Medication Sig Start Date End Date Taking? Authorizing Provider  ALPRAZolam Prudy Feeler) 0.5 MG tablet Take 0.5 mg by mouth at bedtime as needed for anxiety.    [provider]  Cyanocobalamin (VITAMIN B 12 PO) Take 1,000 mcg by mouth daily.    [provider]  dicyclomine (BENTYL) 20 MG tablet Take 1 tablet (20 mg total) by mouth 3 (three) times daily as needed for spasms. 11/03/18 11/03/19  Jene Every, MD  donepezil (ARICEPT) 5 MG tablet Take 5 mg by mouth at bedtime.    [provider]  escitalopram (LEXAPRO) 10 MG tablet Take 10 mg by mouth daily.  [provider]  levothyroxine (SYNTHROID, LEVOTHROID) 112 MCG tablet Take 112 mcg by mouth daily before breakfast.    [provider]  Multiple Vitamin (MULTIVITAMIN) tablet Take 1 tablet by mouth daily.    [provider]  oxyCODONE (OXY IR/ROXICODONE) 5 MG immediate release tablet Take 1-2 tablets (5-10 mg total) by mouth every 3 (three) hours as needed for severe pain or breakthrough pain. Patient not taking: Reported on 06/25/2016 08/10/15   Watt Climes, PA  predniSONE (DELTASONE) 1 MG tablet Take 4  mg by mouth daily with breakfast.    [provider]  predniSONE (DELTASONE) 5 MG tablet Take 5 mg by mouth daily with breakfast.    [provider]  senna (SENOKOT) 8.6 MG tablet Take 1 tablet by mouth daily as needed for constipation. Reported on 08/08/2015    [provider]  solifenacin (VESICARE) 5 MG tablet Take 1 tablet (5 mg total) by mouth daily. Patient not taking: Reported on 11/05/2017 06/25/16   Bjorn Loser, MD  traMADol (ULTRAM) 50 MG tablet Take 1-2 tablets (50-100 mg total) by mouth every 4 (four) hours as needed for moderate pain. Patient not taking: Reported on 06/25/2016 08/10/15   Watt Climes, PA  venlafaxine XR (EFFEXOR-XR) 37.5 MG 24 hr capsule Take 37.5 mg by mouth daily with breakfast.    [provider]    Allergies Ace inhibitors, Codeine, Gabapentin, Micardis hct [telmisartan-hctz], Prednisone, Sulfa antibiotics, and Zithromax [azithromycin]  Family History  Problem Relation Age of Onset  . Prostate cancer Son   . Kidney disease Neg Hx   . Kidney cancer Neg Hx     Social History Social History   Tobacco Use  . Smoking status: Former Smoker    Years: 12.00  . Smokeless tobacco: Never Used  Substance Use Topics  . Alcohol use: Yes    Comment: osccasional  . Drug use: No    Review of Systems  Constitutional: No fever/chills.  Positive for fatigue and malaise. Eyes: No visual changes. ENT: No sore throat. Cardiovascular: Denies chest pain. Respiratory: Denies shortness of breath. Gastrointestinal: Positive for abdominal pain.  Positive for nausea, no vomiting.  Positive for diarrhea.  No constipation. Genitourinary: Negative for dysuria. Musculoskeletal: Negative for back pain. Skin: Negative for rash. Neurological: Positive for headaches, negative for focal weakness or numbness.  ____________________________________________   PHYSICAL EXAM:  VITAL SIGNS: ED Triage Vitals  Enc Vitals Group     BP       Pulse      Resp      Temp      Temp src      SpO2      Weight      Height      Head Circumference      Peak Flow      Pain Score      Pain Loc      Pain Edu?      Excl. in [place]?     Constitutional: Alert and oriented. Eyes: Conjunctivae are normal. Head: Atraumatic. Nose: No congestion/rhinnorhea. Mouth/Throat: Mucous membranes are moist. Neck: Normal ROM Cardiovascular: Normal rate, regular rhythm. Grossly normal heart sounds. Respiratory: Normal respiratory effort.  No retractions. Lungs CTAB. Gastrointestinal: Soft and nontender. No distention. Genitourinary: deferred Musculoskeletal: No lower extremity tenderness nor edema. Neurologic:  Normal speech and language. No gross focal neurologic deficits are appreciated. Skin:  Skin is warm, dry and intact. No rash noted. Psychiatric: Mood and affect are normal. Speech and  behavior are normal.  ____________________________________________   LABS (all labs ordered are listed, but only abnormal results are displayed)  Labs Reviewed  COMPREHENSIVE METABOLIC PANEL - Abnormal; Notable for the following components:      Result Value   Total Protein 6.3 (*)    All other components within normal limits  URINALYSIS, COMPLETE (UACMP) WITH MICROSCOPIC - Abnormal; Notable for the following components:   Color, Urine STRAW (*)    APPearance CLEAR (*)    Leukocytes,Ua TRACE (*)    All other components within normal limits  LIPASE, BLOOD  CBC   ____________________________________________  EKG  ED ECG REPORT I, Chesley Noonharles Becci Batty, the attending physician, personally viewed and interpreted this ECG.   Date: 12/06/2018  EKG Time: 19:28  Rate: 62  Rhythm: normal sinus rhythm  Axis: Normal  Intervals:none  ST&T Change: Nonspecific T wave changes anteriorly    PROCEDURES  Procedure(s) performed (including Critical Care):  Procedures   ____________________________________________   INITIAL IMPRESSION / ASSESSMENT AND  PLAN / ED COURSE       85 year old female presenting to the ED with complaints of headache that has gradually worsened throughout the day today along with malaise and generalized fatigue.  She has a nonfocal neurologic exam and [facility] given gradual onset of headache.  CT scan was performed and negative for acute process.  Patient also has a benign abdominal exam.  Reviewing outside records, many of these are chronic complaints for patient, for which she has been evaluated on multiple occasions in her PCPs office and had multiple prior negative work-ups.  Given benign abdominal exam, doubt acute abdominal process.  Her labs are also reassuring, including LFTs and lipase.  UA without evidence of infection and EKG unremarkable.  No apparent emergent etiology to patient's symptoms, counseled her on need to follow-up with PCP and on symptoms to return to the ED for.  Patient agrees with plan.      ____________________________________________   FINAL CLINICAL IMPRESSION(S) / ED DIAGNOSES  Final diagnoses:  Chronic nonintractable headache, unspecified headache type  Generalized weakness  Malaise and fatigue     ED Discharge Orders    None       Note:  This document was prepared using Dragon voice recognition software and may include unintentional dictation errors.   Chesley NoonJessup, Johaan Ryser, MD 12/06/18 2148

## 2018-12-15 ENCOUNTER — Emergency Department
Admission: EM | Admit: 2018-12-15 | Discharge: 2018-12-15 | Disposition: A | Discharge status: Home / Self Care | Payer: Medicare Other | Attending: Emergency Medicine | Admitting: Emergency Medicine

## 2018-12-15 ENCOUNTER — Emergency Department: Payer: Medicare Other

## 2018-12-15 ENCOUNTER — Other Ambulatory Visit: Payer: Self-pay

## 2018-12-15 DIAGNOSIS — I259 Chronic ischemic heart disease, unspecified: Secondary | ICD-10-CM | POA: Insufficient documentation

## 2018-12-15 DIAGNOSIS — I10 Essential (primary) hypertension: Secondary | ICD-10-CM | POA: Diagnosis not present

## 2018-12-15 DIAGNOSIS — W010XXA Fall on same level from slipping, tripping and stumbling without subsequent striking against object, initial encounter: Secondary | ICD-10-CM | POA: Insufficient documentation

## 2018-12-15 DIAGNOSIS — Y92013 Bedroom of single-family (private) house as the place of occurrence of the external cause: Secondary | ICD-10-CM | POA: Diagnosis not present

## 2018-12-15 DIAGNOSIS — Y999 Unspecified external cause status: Secondary | ICD-10-CM | POA: Diagnosis not present

## 2018-12-15 DIAGNOSIS — M25552 Pain in left hip: Secondary | ICD-10-CM | POA: Insufficient documentation

## 2018-12-15 DIAGNOSIS — W19XXXA Unspecified fall, initial encounter: Secondary | ICD-10-CM

## 2018-12-15 DIAGNOSIS — Y9389 Activity, other specified: Secondary | ICD-10-CM | POA: Diagnosis not present

## 2018-12-15 DIAGNOSIS — E039 Hypothyroidism, unspecified: Secondary | ICD-10-CM | POA: Diagnosis not present

## 2018-12-15 DIAGNOSIS — Z79899 Other long term (current) drug therapy: Secondary | ICD-10-CM | POA: Diagnosis not present

## 2018-12-15 DIAGNOSIS — Z87891 Personal history of nicotine dependence: Secondary | ICD-10-CM | POA: Insufficient documentation

## 2018-12-15 DIAGNOSIS — G8929 Other chronic pain: Secondary | ICD-10-CM

## 2018-12-15 DIAGNOSIS — Z96641 Presence of right artificial hip joint: Secondary | ICD-10-CM | POA: Insufficient documentation

## 2018-12-15 MED ORDER — MORPHINE SULFATE (PF) 4 MG/ML IV SOLN
4.0000 mg | Freq: Once | INTRAVENOUS | Status: AC
  Administered 2018-12-15: 4 mg via INTRAMUSCULAR
  Filled 2018-12-15: qty 1

## 2018-12-15 MED ORDER — TRAMADOL HCL 50 MG PO TABS: 50.0000 mg | ORAL_TABLET | Freq: Four times a day (QID) | ORAL | 0 refills | Status: DC | PRN

## 2018-12-15 MED ORDER — TRAMADOL HCL 50 MG PO TABS
100.0000 mg | ORAL_TABLET | Freq: Once | ORAL | Status: DC
  Filled 2018-12-15: qty 2

## 2018-12-15 NOTE — ED Triage Notes (Signed)
pt arrived via ems from home. ems reports pt called out for fall this morning. she was trying to go up from bed to toilet, slid into floor. Pt c/o sciatica pain that has been hurting for 2 weeks. Pt a&o on arrival. NAD noted. able to stand and transfer from ems stretcher to bed

## 2018-12-15 NOTE — ED Notes (Signed)
Pt up to toilet at this time. Pt able to stand, take a few steps and pivot onto toilet without any visible pain or discomfort noted. Pt able to swing legs easily onto bed. Pt reports pain medication has not started working and reports a pain of 10/10.

## 2018-12-15 NOTE — ED Notes (Signed)
Pt c/o left leg pain starting 3 years ago, with pain increasing over last 2 weeks. Pt report going to primary care on Sunday and receiving shot for pain, with some relief. States started having intense burning sensation last night in left leg, after falling this morning pain radiating up back to head. Reports headache at this time. Denies any LOC, or nausea/vomiting

## 2018-12-15 NOTE — ED Provider Notes (Signed)
[facility] Emergency Department Provider Note  Time seen: 8:52 AM  I have reviewed the triage vital signs and the nursing notes.   HISTORY  Chief Complaint Fall   HPI Kerry Cabrera is a 85 y.o. female with a past medical history of arthritis, CAD, depression, hypertension, hyperlipidemia, presents to the emergency department for left hip pain after a fall.  According to the patient she was getting up to use the restroom approximately 2 hours ago when she fell, believes she slid down her bed to the ground.  Patient does not believe she hit her head.  Denies any headache.  Denies LOC.  Patient does state pain to the left hip but states this is a chronic issue times many years "and nobody will do anything about it."  Patient denies any fever cough.  Largely negative review of systems.   Past Medical History:  Diagnosis Date  . Arthritis   . Carotid artery occlusion   . Complication of anesthesia    very confused for days  . Coronary artery disease   . Depression   . Glaucoma   . Hyperlipemia   . Hypertension    h/o not on medication  . Hypothyroidism   . Insomnia   . PUD (peptic ulcer disease)    in distant past  . TIA (transient ischemic attack)     Patient Active Problem List   Diagnosis Date Noted  . Symptomatic bradycardia 11/05/2017  . Abnormal gait 12/17/2016  . Avascular necrosis of femoral head (HCC) 12/17/2016  . Bursitis of hip 12/17/2016  . CAD (coronary artery disease) 12/17/2016  . Closed fracture of intracapsular section of femur (HCC) 12/17/2016  . Closed transcervical fracture of femur (HCC) 12/17/2016  . Hip pain 12/17/2016  . Inflammation of sacroiliac joint (HCC) 12/17/2016  . Osteoarthritis of hip 12/17/2016  . Osteoarthritis of knee 12/17/2016  . Post-traumatic osteoarthritis 12/17/2016  . Diarrhea 09/20/2016  . Hiatal hernia 09/12/2016  . Acquired hypothyroidism 07/04/2016  . S/P total knee arthroplasty 08/08/2015  .  Moderate mitral insufficiency 12/20/2014  . 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  . Hyperlipidemia, mixed 07/09/2013    Past Surgical History:  Procedure Laterality Date  . CARDIAC CATHETERIZATION    . KNEE ARTHROPLASTY Left 08/08/2015   Procedure: COMPUTER ASSISTED TOTAL KNEE ARTHROPLASTY;  Surgeon: Donato Heinz, MD;  Location: [facility] ORS;  Service: Orthopedics;  Laterality: Left;  . KNEE ARTHROSCOPY    . Percutaneous pinning of right femoral neck fracture    . TONSILLECTOMY    . TOTAL HIP ARTHROPLASTY Right 02/06/2014   Dr. Deeann Saint    Prior to Admission medications   Medication Sig Start Date End Date Taking? Authorizing Provider  ALPRAZolam Prudy Feeler) 0.5 MG tablet Take 0.5 mg by mouth at bedtime as needed for anxiety.    [provider]  Cyanocobalamin (VITAMIN B 12 PO) Take 1,000 mcg by mouth daily.    [provider]  dicyclomine (BENTYL) 20 MG tablet Take 1 tablet (20 mg total) by mouth 3 (three) times daily as needed for spasms. 11/03/18 11/03/19  Jene Every, MD  donepezil (ARICEPT) 5 MG tablet Take 5 mg by mouth at bedtime.    [provider]  escitalopram (LEXAPRO) 10 MG tablet Take 10 mg by mouth daily.    [provider]  levothyroxine (SYNTHROID, LEVOTHROID) 112 MCG tablet Take 112 mcg by mouth daily before breakfast.    [provider]  Multiple Vitamin (MULTIVITAMIN) tablet Take 1 tablet by  mouth daily.    [provider]  oxyCODONE (OXY IR/ROXICODONE) 5 MG immediate release tablet Take 1-2 tablets (5-10 mg total) by mouth every 3 (three) hours as needed for severe pain or breakthrough pain. Patient not taking: Reported on 06/25/2016 08/10/15   Watt Climes, PA  predniSONE (DELTASONE) 1 MG tablet Take 4 mg by mouth daily with breakfast.    [provider]  predniSONE (DELTASONE) 5 MG tablet Take 5 mg by mouth daily with breakfast.    [provider]  senna (SENOKOT) 8.6 MG tablet Take 1 tablet by mouth daily as  needed for constipation. Reported on 08/08/2015    [provider]  solifenacin (VESICARE) 5 MG tablet Take 1 tablet (5 mg total) by mouth daily. Patient not taking: Reported on 11/05/2017 06/25/16   Bjorn Loser, MD  traMADol (ULTRAM) 50 MG tablet Take 1-2 tablets (50-100 mg total) by mouth every 4 (four) hours as needed for moderate pain. Patient not taking: Reported on 06/25/2016 08/10/15   Watt Climes, PA  venlafaxine XR (EFFEXOR-XR) 37.5 MG 24 hr capsule Take 37.5 mg by mouth daily with breakfast.    [provider]    Allergies  Allergen Reactions  . Ace Inhibitors Cough  . Codeine Nausea And Vomiting and Nausea Only  . Gabapentin Other (See Comments)    unsure  . Micardis Hct [Telmisartan-Hctz]     unsure  . Prednisone Other (See Comments)    Pt has received cortisone injection for knee with out problems. Pt states she will not take pills for extended time due to feeling lethargic and weight gain. Weight gain  . Sulfa Antibiotics Other (See Comments) and Rash    Fever 106 degrees,  sent to ER High fever  . Zithromax [Azithromycin] Other (See Comments) and Rash    unsure    Family History  Problem Relation Age of Onset  . Prostate cancer Son   . Kidney disease Neg Hx   . Kidney cancer Neg Hx     Social History Social History   Tobacco Use  . Smoking status: Former Smoker    Years: 12.00  . Smokeless tobacco: Never Used  Substance Use Topics  . Alcohol use: Yes    Comment: osccasional  . Drug use: No    Review of Systems Constitutional: Negative for fever. ENT: Negative for recent illness/congestion Cardiovascular: Negative for chest pain. Respiratory: Negative for shortness of breath.  Negative for cough. Gastrointestinal: Negative for abdominal pain Musculoskeletal: Left hip pain Skin: Negative for skin complaints  Neurological: Negative for headache All other ROS negative  ____________________________________________   PHYSICAL  EXAM:  VITAL SIGNS: ED Triage Vitals  Enc Vitals Group     BP 12/15/18 0836 (!) 179/70     Pulse Rate 12/15/18 0838 (!) 51     Resp 12/15/18 0839 16     Temp 12/15/18 0839 98.8 F (37.1 C)     Temp Source 12/15/18 0839 Oral     SpO2 12/15/18 0838 98 %     Weight 12/15/18 0840 140 lb (63.5 kg)     Height 12/15/18 0840 5\' 6"  (1.676 m)     Head Circumference --      Peak Flow --      Pain Score 12/15/18 0840 10     Pain Loc --      Pain Edu? --      Excl. in [place]? --    Constitutional: Alert and oriented. Well appearing and  in no distress. Eyes: Normal exam ENT      Head: Normocephalic and atraumatic.      Mouth/Throat: Mucous membranes are moist. Cardiovascular: Normal rate, regular rhythm. Respiratory: Normal respiratory effort without tachypnea nor retractions. Breath sounds are clear  Gastrointestinal: Soft and nontender. No distention. Musculoskeletal: Nontender with normal range of motion in all extremities. Neurologic:  Normal speech and language. No gross focal neurologic deficits  Skin:  Skin is warm, dry and intact.  Psychiatric: Mood and affect are normal.  ____________________________________________   RADIOLOGY  X-ray negative  ____________________________________________   INITIAL IMPRESSION / ASSESSMENT AND PLAN / ED COURSE  Pertinent labs & imaging results that were available during my care of the patient were reviewed by me and considered in my medical decision making (see chart for details).   Patient presents to the emergency department after a fall this morning.  Patient has chronic left hip pain, worse since her fall.  We will dose pain medication, obtain hip x-rays and continue to closely monitor.  Patient's x-ray is negative for acute abnormality suspect likely osteoarthritis as a cause of her pain.  Patient otherwise appears well.  Pain is somewhat improved after pain medication.  We will discharge patient home with a short course of  Ultram.  Clayborne Danaatti B Ordoyne was evaluated in Emergency Department on 12/15/2018 for the symptoms described in the history of present illness. She was evaluated in the context of the global COVID-19 pandemic, which necessitated consideration that the patient might be at risk for infection with the SARS-CoV-2 virus that causes COVID-19. Institutional protocols and algorithms that pertain to the evaluation of patients at risk for COVID-19 are in a state of rapid change based on information released by regulatory bodies including the CDC and federal and state organizations. These policies and algorithms were followed during the patient's care in the ED.  ____________________________________________   FINAL CLINICAL IMPRESSION(S) / ED DIAGNOSES  Hip pain Thresa RossFall   Haydin Dunn, MD 12/15/18 1020

## 2018-12-26 ENCOUNTER — Encounter: Admission: RE | Admit: 2018-12-26 | Payer: Medicare Other | Source: Ambulatory Visit

## 2018-12-31 ENCOUNTER — Ambulatory Visit: Admission: RE | Admit: 2018-12-31 | Payer: Medicare Other | Source: Home / Self Care | Admitting: Internal Medicine

## 2018-12-31 ENCOUNTER — Encounter: Admission: RE | Payer: Self-pay | Source: Home / Self Care

## 2018-12-31 SURGERY — SIGMOIDOSCOPY, FLEXIBLE
Anesthesia: General

## 2019-03-18 ENCOUNTER — Encounter: Admission: RE | Payer: Self-pay | Source: Home / Self Care

## 2019-03-18 ENCOUNTER — Ambulatory Visit: Admission: RE | Admit: 2019-03-18 | Payer: Medicare Other | Source: Home / Self Care | Admitting: Internal Medicine

## 2019-03-18 SURGERY — SIGMOIDOSCOPY, FLEXIBLE
Anesthesia: General

## 2019-03-26 ENCOUNTER — Other Ambulatory Visit: Payer: Self-pay

## 2019-03-26 ENCOUNTER — Encounter: Payer: Self-pay | Admitting: Emergency Medicine

## 2019-03-26 ENCOUNTER — Emergency Department: Payer: Medicare Other

## 2019-03-26 ENCOUNTER — Emergency Department
Admission: EM | Admit: 2019-03-26 | Discharge: 2019-03-26 | Disposition: A | Discharge status: Home / Self Care | Payer: Medicare Other | Attending: Emergency Medicine | Admitting: Emergency Medicine

## 2019-03-26 DIAGNOSIS — Z79899 Other long term (current) drug therapy: Secondary | ICD-10-CM | POA: Diagnosis not present

## 2019-03-26 DIAGNOSIS — Y92013 Bedroom of single-family (private) house as the place of occurrence of the external cause: Secondary | ICD-10-CM | POA: Insufficient documentation

## 2019-03-26 DIAGNOSIS — Z8673 Personal history of transient ischemic attack (TIA), and cerebral infarction without residual deficits: Secondary | ICD-10-CM | POA: Diagnosis not present

## 2019-03-26 DIAGNOSIS — Y998 Other external cause status: Secondary | ICD-10-CM | POA: Insufficient documentation

## 2019-03-26 DIAGNOSIS — Z96652 Presence of left artificial knee joint: Secondary | ICD-10-CM | POA: Insufficient documentation

## 2019-03-26 DIAGNOSIS — I10 Essential (primary) hypertension: Secondary | ICD-10-CM | POA: Insufficient documentation

## 2019-03-26 DIAGNOSIS — W01190A Fall on same level from slipping, tripping and stumbling with subsequent striking against furniture, initial encounter: Secondary | ICD-10-CM | POA: Diagnosis not present

## 2019-03-26 DIAGNOSIS — E039 Hypothyroidism, unspecified: Secondary | ICD-10-CM | POA: Diagnosis not present

## 2019-03-26 DIAGNOSIS — S0990XA Unspecified injury of head, initial encounter: Secondary | ICD-10-CM | POA: Insufficient documentation

## 2019-03-26 DIAGNOSIS — W19XXXA Unspecified fall, initial encounter: Secondary | ICD-10-CM

## 2019-03-26 DIAGNOSIS — Y9389 Activity, other specified: Secondary | ICD-10-CM | POA: Diagnosis not present

## 2019-03-26 DIAGNOSIS — Z87891 Personal history of nicotine dependence: Secondary | ICD-10-CM | POA: Insufficient documentation

## 2019-03-26 DIAGNOSIS — Z96641 Presence of right artificial hip joint: Secondary | ICD-10-CM | POA: Insufficient documentation

## 2019-03-26 DIAGNOSIS — I2510 Atherosclerotic heart disease of native coronary artery without angina pectoris: Secondary | ICD-10-CM | POA: Diagnosis not present

## 2019-03-26 LAB — COMPREHENSIVE METABOLIC PANEL
ALT: 14 U/L (ref 0–44)
AST: 16 U/L (ref 15–41)
Albumin: 3.4 g/dL — ABNORMAL LOW (ref 3.5–5.0)
Alkaline Phosphatase: 65 U/L (ref 38–126)
Anion gap: 9 (ref 5–15)
BUN: 13 mg/dL (ref 8–23)
CO2: 25 mmol/L (ref 22–32)
Calcium: 8.9 mg/dL (ref 8.9–10.3)
Chloride: 104 mmol/L (ref 98–111)
Creatinine, Ser: 0.69 mg/dL (ref 0.44–1.00)
GFR calc Af Amer: >60 mL/min (ref >60)
GFR calc non Af Amer: >60 mL/min (ref >60)
Glucose, Bld: 117 mg/dL — ABNORMAL HIGH (ref 70–99)
Potassium: 4.1 mmol/L (ref 3.5–5.1)
Sodium: 138 mmol/L (ref 135–145)
Total Bilirubin: 0.7 mg/dL (ref 0.3–1.2)
Total Protein: 6.4 g/dL — ABNORMAL LOW (ref 6.5–8.1)

## 2019-03-26 LAB — CBC WITH DIFFERENTIAL/PLATELET
Abs Immature Granulocytes: 0.02 10*3/uL (ref 0.00–0.07)
Basophils Absolute: 0.1 10*3/uL (ref 0.0–0.1)
Basophils Relative: 2 %
Eosinophils Absolute: 0 10*3/uL (ref 0.0–0.5)
Eosinophils Relative: 1 %
HCT: 39.5 % (ref 36.0–46.0)
Hemoglobin: 12.8 g/dL (ref 12.0–15.0)
Immature Granulocytes: 1 %
Lymphocytes Relative: 21 %
Lymphs Abs: 0.9 10*3/uL (ref 0.7–4.0)
MCH: 28.7 pg (ref 26.0–34.0)
MCHC: 32.4 g/dL (ref 30.0–36.0)
MCV: 88.6 fL (ref 80.0–100.0)
Monocytes Absolute: 0.4 10*3/uL (ref 0.1–1.0)
Monocytes Relative: 10 %
Neutro Abs: 2.9 10*3/uL (ref 1.7–7.7)
Neutrophils Relative %: 65 %
Platelets: 318 10*3/uL (ref 150–400)
RBC: 4.46 MIL/uL (ref 3.87–5.11)
RDW: 13.8 % (ref 11.5–15.5)
WBC: 4.4 10*3/uL (ref 4.0–10.5)
nRBC: 0 % (ref 0.0–0.2)

## 2019-03-26 LAB — TROPONIN I (HIGH SENSITIVITY): Troponin I (High Sensitivity): 4 ng/L (ref <18)

## 2019-03-26 LAB — CK: Total CK: 49 U/L (ref 38–234)

## 2019-03-26 NOTE — ED Provider Notes (Signed)
[facility] Emergency Department Provider Note   ____________________________________________    I have reviewed the triage vital signs and the nursing notes.   HISTORY  Chief Complaint Fall and Head Injury     HPI Kerry Cabrera is a 85 y.o. female who presents after a fall with head injury.  Patient reports when she got out of bed today she may have stood up too quickly and fell and struck the right side of her head.  No LOC.  No neck pain or back pain.  No chest wall pain.  No abdominal pain nausea or vomiting.  No extremity injuries.  Not on blood thinners reportedly.  Currently feels well  Past Medical History:  Diagnosis Date  . Arthritis   . Carotid artery occlusion   . Complication of anesthesia    very confused for days  . Coronary artery disease   . Depression   . Glaucoma   . Hyperlipemia   . Hypertension    h/o not on medication  . Hypothyroidism   . Insomnia   . PUD (peptic ulcer disease)    in distant past  . TIA (transient ischemic attack)     Patient Active Problem List   Diagnosis Date Noted  . Symptomatic bradycardia 11/05/2017  . Abnormal gait 12/17/2016  . Avascular necrosis of femoral head (HCC) 12/17/2016  . Bursitis of hip 12/17/2016  . CAD (coronary artery disease) 12/17/2016  . Closed fracture of intracapsular section of femur (HCC) 12/17/2016  . Closed transcervical fracture of femur (HCC) 12/17/2016  . Hip pain 12/17/2016  . Inflammation of sacroiliac joint (HCC) 12/17/2016  . Osteoarthritis of hip 12/17/2016  . Osteoarthritis of knee 12/17/2016  . Post-traumatic osteoarthritis 12/17/2016  . Diarrhea 09/20/2016  . Hiatal hernia 09/12/2016  . Acquired hypothyroidism 07/04/2016  . S/P total knee arthroplasty 08/08/2015  . Moderate mitral insufficiency 12/20/2014  . 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  . Hyperlipidemia, mixed 07/09/2013    Past Surgical History:  Procedure Laterality Date  . CARDIAC  CATHETERIZATION    . KNEE ARTHROPLASTY Left 08/08/2015   Procedure: COMPUTER ASSISTED TOTAL KNEE ARTHROPLASTY;  Surgeon: Donato Heinz, MD;  Location: [facility] ORS;  Service: Orthopedics;  Laterality: Left;  . KNEE ARTHROSCOPY    . Percutaneous pinning of right femoral neck fracture    . TONSILLECTOMY    . TOTAL HIP ARTHROPLASTY Right 02/06/2014   Dr. Deeann Saint    Prior to Admission medications   Medication Sig Start Date End Date Taking? Authorizing Provider  ALPRAZolam Prudy Feeler) 0.5 MG tablet Take 0.5 mg by mouth at bedtime as needed for anxiety.    [provider]  Cyanocobalamin (VITAMIN B 12 PO) Take 1,000 mcg by mouth daily.    [provider]  dicyclomine (BENTYL) 20 MG tablet Take 1 tablet (20 mg total) by mouth 3 (three) times daily as needed for spasms. 11/03/18 11/03/19  Jene Every, MD  donepezil (ARICEPT) 5 MG tablet Take 5 mg by mouth at bedtime.    [provider]  escitalopram (LEXAPRO) 10 MG tablet Take 10 mg by mouth daily.    [provider]  levothyroxine (SYNTHROID, LEVOTHROID) 112 MCG tablet Take 112 mcg by mouth daily before breakfast.    [provider]  Multiple Vitamin (MULTIVITAMIN) tablet Take 1 tablet by mouth daily.    [provider]  predniSONE (DELTASONE) 1 MG tablet Take 4 mg by mouth daily with breakfast.    [provider]  predniSONE (DELTASONE) 5 MG tablet Take 5 mg by mouth daily with breakfast.    [provider]  senna (SENOKOT) 8.6 MG tablet Take 1 tablet by mouth daily as needed for constipation. Reported on 08/08/2015    [provider]  traMADol (ULTRAM) 50 MG tablet Take 1 tablet (50 mg total) by mouth every 6 (six) hours as needed. 12/15/18   Harvest Dark, MD  venlafaxine XR (EFFEXOR-XR) 37.5 MG 24 hr capsule Take 37.5 mg by mouth daily with breakfast.    [provider]     Allergies Ace inhibitors, Codeine, Gabapentin, Micardis hct [telmisartan-hctz],  Prednisone, Sulfa antibiotics, and Zithromax [azithromycin]  Family History  Problem Relation Age of Onset  . Prostate cancer Son   . Kidney disease Neg Hx   . Kidney cancer Neg Hx     Social History Social History   Tobacco Use  . Smoking status: Former Smoker    Years: 12.00  . Smokeless tobacco: Never Used  Substance Use Topics  . Alcohol use: Yes    Comment: osccasional  . Drug use: No    Review of Systems  Constitutional: No dizziness Eyes: No visual changes.  ENT: No neck pain Cardiovascular: Denies chest pain. Respiratory: Denies shortness of breath. Gastrointestinal: No abdominal pain.  No nausea, no vomiting.   Genitourinary: Negative for dysuria. Musculoskeletal: Negative for back pain. Skin: Negative for laceration or bleeding Neurological: Negative for headaches, no focal weakness   ____________________________________________   PHYSICAL EXAM:  VITAL SIGNS: ED Triage Vitals  Enc Vitals Group     BP 03/26/19 0959 (!) 151/61     Pulse Rate 03/26/19 0959 60     Resp 03/26/19 0959 20     Temp 03/26/19 0959 98.8 F (37.1 C)     Temp Source 03/26/19 0959 Oral     SpO2 03/26/19 0959 96 %     Weight 03/26/19 1000 63.5 kg (140 lb)     Height 03/26/19 1000 1.676 m (5\' 6" )     Head Circumference --      Peak Flow --      Pain Score 03/26/19 0959 8     Pain Loc --      Pain Edu? --      Excl. in [place]? --     Constitutional: Alert, pleasant Head: Small hematoma to the right superior scalp, no bleeding Nose: No swelling or epistaxis Mouth/Throat: Mucous membranes are moist.   Neck: Painless range of motion, no vertebral tenderness palpation Cardiovascular: Normal rate, regular rhythm. Kermit Balo peripheral circulation.  No chest wall tenderness palpation Respiratory: Normal respiratory effort.  No retractions.  Gastrointestinal: Soft and nontender. No distention.    Musculoskeletal: Warm and well perfused, no pain with axial load on both hips.  Normal  range of motion of all extremities without injury Neurologic:  Normal speech and language. No gross focal neurologic deficits are appreciated.  Skin:  Skin is warm, dry and intact. No rash noted. Psychiatric: Mood and affect are normal. Speech and behavior are normal.  ____________________________________________   LABS (all labs ordered are listed, but only abnormal results are displayed)  Labs Reviewed  COMPREHENSIVE METABOLIC PANEL - Abnormal; Notable for the following components:      Result Value   Glucose, Bld 117 (*)    Total Protein 6.4 (*)    Albumin 3.4 (*)    All other components within normal limits  CBC WITH DIFFERENTIAL/PLATELET  CK  URINALYSIS, COMPLETE (UACMP) WITH MICROSCOPIC  TROPONIN I (HIGH SENSITIVITY)   ____________________________________________  EKG  ED ECG REPORT I, Jene Everyobert Yancey Pedley, the attending physician, personally viewed and interpreted this ECG.  Date: 03/26/2019  Rhythm: normal sinus rhythm QRS Axis: normal Intervals: normal ST/T Wave abnormalities: normal Narrative Interpretation: no evidence of acute ischemia  ____________________________________________  RADIOLOGY  CT head unremarkable ____________________________________________   PROCEDURES  Procedure(s) performed: No  Procedures   Critical Care performed: No ____________________________________________   INITIAL IMPRESSION / ASSESSMENT AND PLAN / ED COURSE  Pertinent labs & imaging results that were available during my care of the patient were reviewed by me and considered in my medical decision making (see chart for details).  Patient presents after a fall, she is well-appearing and in no acute distress, vital signs are reassuring, small hematoma to the scalp, CT head unremarkable.  Lab work and extensive exam also reassuring.  Appropriate for discharge with outpatient follow-up as needed,    ____________________________________________   FINAL CLINICAL  IMPRESSION(S) / ED DIAGNOSES  Final diagnoses:  Injury of head, initial encounter  Fall, initial encounter        Note:  This document was prepared using Dragon voice recognition software and may include unintentional dictation errors.   Jene EveryKinner, Laketta Soderberg, MD 03/26/19 [phone]

## 2019-03-26 NOTE — ED Triage Notes (Signed)
Pt reports fell off the bed and hit her head on the floor. Pt with knot to right side of head. Pt denies LOC. Pt reports has not been at [facility] long enough to learn the place and thinks that may have been why she fell. EMS reports pt fell a couple of week ago as well. Pt is not on blood thinners.

## 2019-03-26 NOTE — ED Notes (Signed)
Assisted pt to use restroom. Pt to CT with Suezanne Jacquet, CT Tech.

## 2019-04-11 ENCOUNTER — Emergency Department
Admission: EM | Admit: 2019-04-11 | Discharge: 2019-04-11 | Disposition: A | Discharge status: Home / Self Care | Payer: Medicare Other | Attending: Emergency Medicine | Admitting: Emergency Medicine

## 2019-04-11 ENCOUNTER — Encounter: Payer: Self-pay | Admitting: Emergency Medicine

## 2019-04-11 ENCOUNTER — Other Ambulatory Visit: Payer: Self-pay

## 2019-04-11 DIAGNOSIS — Z5321 Procedure and treatment not carried out due to patient leaving prior to being seen by health care provider: Secondary | ICD-10-CM | POA: Diagnosis not present

## 2019-04-11 DIAGNOSIS — R109 Unspecified abdominal pain: Secondary | ICD-10-CM | POA: Diagnosis present

## 2019-04-11 LAB — COMPREHENSIVE METABOLIC PANEL
ALT: 12 U/L (ref 0–44)
AST: 19 U/L (ref 15–41)
Albumin: 3.5 g/dL (ref 3.5–5.0)
Alkaline Phosphatase: 80 U/L (ref 38–126)
Anion gap: 10 (ref 5–15)
BUN: 14 mg/dL (ref 8–23)
CO2: 27 mmol/L (ref 22–32)
Calcium: 8.7 mg/dL — ABNORMAL LOW (ref 8.9–10.3)
Chloride: 103 mmol/L (ref 98–111)
Creatinine, Ser: 0.76 mg/dL (ref 0.44–1.00)
GFR calc Af Amer: >60 mL/min (ref >60)
GFR calc non Af Amer: >60 mL/min (ref >60)
Glucose, Bld: 113 mg/dL — ABNORMAL HIGH (ref 70–99)
Potassium: 4.2 mmol/L (ref 3.5–5.1)
Sodium: 140 mmol/L (ref 135–145)
Total Bilirubin: 0.7 mg/dL (ref 0.3–1.2)
Total Protein: 6.7 g/dL (ref 6.5–8.1)

## 2019-04-11 LAB — CBC
HCT: 38.2 % (ref 36.0–46.0)
Hemoglobin: 12.3 g/dL (ref 12.0–15.0)
MCH: 27.3 pg (ref 26.0–34.0)
MCHC: 32.2 g/dL (ref 30.0–36.0)
MCV: 84.7 fL (ref 80.0–100.0)
Platelets: 293 10*3/uL (ref 150–400)
RBC: 4.51 MIL/uL (ref 3.87–5.11)
RDW: 13.8 % (ref 11.5–15.5)
WBC: 5.1 10*3/uL (ref 4.0–10.5)
nRBC: 0 % (ref 0.0–0.2)

## 2019-04-11 LAB — URINALYSIS, COMPLETE (UACMP) WITH MICROSCOPIC
Bilirubin Urine: NEGATIVE
Glucose, UA: NEGATIVE mg/dL
Hgb urine dipstick: NEGATIVE
Ketones, ur: NEGATIVE mg/dL
Nitrite: NEGATIVE
Protein, ur: NEGATIVE mg/dL
Specific Gravity, Urine: 1.014 (ref 1.005–1.030)
pH: 6 (ref 5.0–8.0)

## 2019-04-11 LAB — LIPASE, BLOOD: Lipase: 39 U/L (ref 11–51)

## 2019-04-11 MED ORDER — SODIUM CHLORIDE 0.9% FLUSH: 3.0000 mL | Freq: Once | INTRAVENOUS | Status: DC

## 2019-04-11 NOTE — ED Notes (Signed)
Called for room, not in WR.  

## 2019-04-11 NOTE — ED Triage Notes (Signed)
Pt to ED via POV c/o mid abdominal pain since she woke up this morning around 0500. Pt denies N/V/D. Pt is in NAD.

## 2019-06-21 ENCOUNTER — Inpatient Hospital Stay
Admission: EM | Admit: 2019-06-21 | Discharge: 2019-06-24 | DRG: 480 | Disposition: A | Discharge status: Skilled Nursing Facility | Payer: Medicare Other | Attending: Internal Medicine | Admitting: Internal Medicine

## 2019-06-21 ENCOUNTER — Inpatient Hospital Stay: Payer: Medicare Other | Admitting: Anesthesiology

## 2019-06-21 ENCOUNTER — Emergency Department: Payer: Medicare Other

## 2019-06-21 ENCOUNTER — Other Ambulatory Visit: Payer: Self-pay

## 2019-06-21 ENCOUNTER — Inpatient Hospital Stay: Payer: Medicare Other

## 2019-06-21 ENCOUNTER — Inpatient Hospital Stay: Admit: 2019-06-21 | Payer: Medicare Other | Admitting: Surgery

## 2019-06-21 ENCOUNTER — Encounter: Payer: Self-pay | Admitting: Emergency Medicine

## 2019-06-21 ENCOUNTER — Encounter
Admission: EM | Disposition: A | Discharge status: Skilled Nursing Facility | Payer: Self-pay | Source: Home / Self Care | Attending: Internal Medicine

## 2019-06-21 DIAGNOSIS — Z79899 Other long term (current) drug therapy: Secondary | ICD-10-CM

## 2019-06-21 DIAGNOSIS — W19XXXA Unspecified fall, initial encounter: Secondary | ICD-10-CM | POA: Diagnosis not present

## 2019-06-21 DIAGNOSIS — S72002A Fracture of unspecified part of neck of left femur, initial encounter for closed fracture: Secondary | ICD-10-CM | POA: Diagnosis present

## 2019-06-21 DIAGNOSIS — S72142A Displaced intertrochanteric fracture of left femur, initial encounter for closed fracture: Principal | ICD-10-CM | POA: Diagnosis present

## 2019-06-21 DIAGNOSIS — K449 Diaphragmatic hernia without obstruction or gangrene: Secondary | ICD-10-CM | POA: Diagnosis present

## 2019-06-21 DIAGNOSIS — Z79890 Hormone replacement therapy (postmenopausal): Secondary | ICD-10-CM

## 2019-06-21 DIAGNOSIS — W010XXA Fall on same level from slipping, tripping and stumbling without subsequent striking against object, initial encounter: Secondary | ICD-10-CM | POA: Diagnosis present

## 2019-06-21 DIAGNOSIS — G9341 Metabolic encephalopathy: Secondary | ICD-10-CM | POA: Diagnosis present

## 2019-06-21 DIAGNOSIS — K219 Gastro-esophageal reflux disease without esophagitis: Secondary | ICD-10-CM | POA: Diagnosis present

## 2019-06-21 DIAGNOSIS — G4700 Insomnia, unspecified: Secondary | ICD-10-CM | POA: Diagnosis present

## 2019-06-21 DIAGNOSIS — R509 Fever, unspecified: Secondary | ICD-10-CM | POA: Diagnosis not present

## 2019-06-21 DIAGNOSIS — Z87891 Personal history of nicotine dependence: Secondary | ICD-10-CM

## 2019-06-21 DIAGNOSIS — Z20822 Contact with and (suspected) exposure to covid-19: Secondary | ICD-10-CM | POA: Diagnosis present

## 2019-06-21 DIAGNOSIS — F418 Other specified anxiety disorders: Secondary | ICD-10-CM | POA: Diagnosis present

## 2019-06-21 DIAGNOSIS — Z96641 Presence of right artificial hip joint: Secondary | ICD-10-CM | POA: Diagnosis present

## 2019-06-21 DIAGNOSIS — Z885 Allergy status to narcotic agent status: Secondary | ICD-10-CM

## 2019-06-21 DIAGNOSIS — I2510 Atherosclerotic heart disease of native coronary artery without angina pectoris: Secondary | ICD-10-CM | POA: Diagnosis present

## 2019-06-21 DIAGNOSIS — Z96652 Presence of left artificial knee joint: Secondary | ICD-10-CM | POA: Diagnosis present

## 2019-06-21 DIAGNOSIS — Z79891 Long term (current) use of opiate analgesic: Secondary | ICD-10-CM | POA: Diagnosis not present

## 2019-06-21 DIAGNOSIS — I10 Essential (primary) hypertension: Secondary | ICD-10-CM | POA: Diagnosis present

## 2019-06-21 DIAGNOSIS — Z419 Encounter for procedure for purposes other than remedying health state, unspecified: Secondary | ICD-10-CM

## 2019-06-21 DIAGNOSIS — Z66 Do not resuscitate: Secondary | ICD-10-CM | POA: Diagnosis present

## 2019-06-21 DIAGNOSIS — D649 Anemia, unspecified: Secondary | ICD-10-CM | POA: Diagnosis present

## 2019-06-21 DIAGNOSIS — Z882 Allergy status to sulfonamides status: Secondary | ICD-10-CM

## 2019-06-21 DIAGNOSIS — H409 Unspecified glaucoma: Secondary | ICD-10-CM | POA: Diagnosis present

## 2019-06-21 DIAGNOSIS — M62838 Other muscle spasm: Secondary | ICD-10-CM | POA: Diagnosis present

## 2019-06-21 DIAGNOSIS — Z8711 Personal history of peptic ulcer disease: Secondary | ICD-10-CM

## 2019-06-21 DIAGNOSIS — J9601 Acute respiratory failure with hypoxia: Secondary | ICD-10-CM | POA: Diagnosis not present

## 2019-06-21 DIAGNOSIS — Z881 Allergy status to other antibiotic agents status: Secondary | ICD-10-CM

## 2019-06-21 DIAGNOSIS — E039 Hypothyroidism, unspecified: Secondary | ICD-10-CM | POA: Diagnosis present

## 2019-06-21 DIAGNOSIS — E785 Hyperlipidemia, unspecified: Secondary | ICD-10-CM | POA: Diagnosis present

## 2019-06-21 DIAGNOSIS — E782 Mixed hyperlipidemia: Secondary | ICD-10-CM | POA: Diagnosis present

## 2019-06-21 DIAGNOSIS — Z888 Allergy status to other drugs, medicaments and biological substances status: Secondary | ICD-10-CM

## 2019-06-21 DIAGNOSIS — Z8673 Personal history of transient ischemic attack (TIA), and cerebral infarction without residual deficits: Secondary | ICD-10-CM | POA: Diagnosis not present

## 2019-06-21 HISTORY — PX: HIP PINNING,CANNULATED: SHX1758

## 2019-06-21 LAB — URINALYSIS, COMPLETE (UACMP) WITH MICROSCOPIC
Bilirubin Urine: NEGATIVE
Glucose, UA: NEGATIVE mg/dL
Hgb urine dipstick: NEGATIVE
Ketones, ur: NEGATIVE mg/dL
Nitrite: NEGATIVE
Protein, ur: NEGATIVE mg/dL
Specific Gravity, Urine: 1.006 (ref 1.005–1.030)
pH: 6 (ref 5.0–8.0)

## 2019-06-21 LAB — BASIC METABOLIC PANEL
Anion gap: 8 (ref 5–15)
BUN: 15 mg/dL (ref 8–23)
CO2: 30 mmol/L (ref 22–32)
Calcium: 8.8 mg/dL — ABNORMAL LOW (ref 8.9–10.3)
Chloride: 98 mmol/L (ref 98–111)
Creatinine, Ser: 0.98 mg/dL (ref 0.44–1.00)
GFR calc Af Amer: 60 mL/min — ABNORMAL LOW (ref >60)
GFR calc non Af Amer: 52 mL/min — ABNORMAL LOW (ref >60)
Glucose, Bld: 93 mg/dL (ref 70–99)
Potassium: 4.1 mmol/L (ref 3.5–5.1)
Sodium: 136 mmol/L (ref 135–145)

## 2019-06-21 LAB — CBC WITH DIFFERENTIAL/PLATELET
Abs Immature Granulocytes: 0.04 10*3/uL (ref 0.00–0.07)
Basophils Absolute: 0.1 10*3/uL (ref 0.0–0.1)
Basophils Relative: 1 %
Eosinophils Absolute: 0.1 10*3/uL (ref 0.0–0.5)
Eosinophils Relative: 1 %
HCT: 35.9 % — ABNORMAL LOW (ref 36.0–46.0)
Hemoglobin: 11.2 g/dL — ABNORMAL LOW (ref 12.0–15.0)
Immature Granulocytes: 1 %
Lymphocytes Relative: 26 %
Lymphs Abs: 1.3 10*3/uL (ref 0.7–4.0)
MCH: 26.2 pg (ref 26.0–34.0)
MCHC: 31.2 g/dL (ref 30.0–36.0)
MCV: 84.1 fL (ref 80.0–100.0)
Monocytes Absolute: 0.5 10*3/uL (ref 0.1–1.0)
Monocytes Relative: 10 %
Neutro Abs: 3 10*3/uL (ref 1.7–7.7)
Neutrophils Relative %: 61 %
Platelets: 291 10*3/uL (ref 150–400)
RBC: 4.27 MIL/uL (ref 3.87–5.11)
RDW: 14.5 % (ref 11.5–15.5)
WBC: 4.9 10*3/uL (ref 4.0–10.5)
nRBC: 0 % (ref 0.0–0.2)

## 2019-06-21 LAB — RESPIRATORY PANEL BY RT PCR (FLU A&B, COVID)
Influenza A by PCR: NEGATIVE
Influenza B by PCR: NEGATIVE
SARS Coronavirus 2 by RT PCR: NEGATIVE

## 2019-06-21 LAB — SAMPLE TO BLOOD BANK

## 2019-06-21 LAB — BRAIN NATRIURETIC PEPTIDE: B Natriuretic Peptide: 145 pg/mL — ABNORMAL HIGH (ref 0.0–100.0)

## 2019-06-21 SURGERY — FIXATION, FEMUR, NECK, PERCUTANEOUS, USING SCREW
Anesthesia: Spinal | Site: Hip | Laterality: Left

## 2019-06-21 MED ORDER — ENOXAPARIN SODIUM 40 MG/0.4ML ~~LOC~~ SOLN
40.0000 mg | SUBCUTANEOUS | Status: DC
  Administered 2019-06-22 – 2019-06-24 (×3): 40 mg via SUBCUTANEOUS
  Filled 2019-06-21 (×3): qty 0.4

## 2019-06-21 MED ORDER — ONDANSETRON HCL 4 MG/2ML IJ SOLN: 4.0000 mg | Freq: Three times a day (TID) | INTRAMUSCULAR | Status: DC | PRN

## 2019-06-21 MED ORDER — OXYCODONE HCL 5 MG PO TABS
5.0000 mg | ORAL_TABLET | ORAL | Status: DC | PRN
  Administered 2019-06-21 – 2019-06-23 (×3): 5 mg via ORAL
  Filled 2019-06-21 (×3): qty 1

## 2019-06-21 MED ORDER — IPRATROPIUM BROMIDE HFA 17 MCG/ACT IN AERS: 2.0000 | INHALATION_SPRAY | RESPIRATORY_TRACT | Status: DC

## 2019-06-21 MED ORDER — PROPOFOL 500 MG/50ML IV EMUL
INTRAVENOUS | Status: AC
  Filled 2019-06-21: qty 50

## 2019-06-21 MED ORDER — DONEPEZIL HCL 5 MG PO TABS
10.0000 mg | ORAL_TABLET | Freq: Every day | ORAL | Status: DC
  Administered 2019-06-21 – 2019-06-23 (×3): 10 mg via ORAL
  Filled 2019-06-21 (×3): qty 2

## 2019-06-21 MED ORDER — BISACODYL 10 MG RE SUPP: 10.0000 mg | Freq: Every day | RECTAL | Status: DC | PRN

## 2019-06-21 MED ORDER — SUCRALFATE 1 G PO TABS
1.0000 g | ORAL_TABLET | Freq: Four times a day (QID) | ORAL | Status: DC
  Administered 2019-06-21 – 2019-06-24 (×12): 1 g via ORAL
  Filled 2019-06-21 (×11): qty 1

## 2019-06-21 MED ORDER — ONDANSETRON HCL 4 MG/2ML IJ SOLN
4.0000 mg | Freq: Once | INTRAMUSCULAR | Status: AC
  Administered 2019-06-21: 4 mg via INTRAVENOUS
  Filled 2019-06-21: qty 2

## 2019-06-21 MED ORDER — EPINEPHRINE PF 1 MG/ML IJ SOLN
INTRAMUSCULAR | Status: AC
  Filled 2019-06-21: qty 1

## 2019-06-21 MED ORDER — IPRATROPIUM BROMIDE 0.02 % IN SOLN: 2.5000 mL | Freq: Four times a day (QID) | RESPIRATORY_TRACT | Status: DC

## 2019-06-21 MED ORDER — OXYCODONE-ACETAMINOPHEN 5-325 MG PO TABS: 1.0000 | ORAL_TABLET | ORAL | Status: DC | PRN

## 2019-06-21 MED ORDER — PROPOFOL 500 MG/50ML IV EMUL
INTRAVENOUS | Status: DC | PRN
  Administered 2019-06-21: 75 ug/kg/min via INTRAVENOUS

## 2019-06-21 MED ORDER — MIDAZOLAM HCL 5 MG/5ML IJ SOLN
INTRAMUSCULAR | Status: DC | PRN
  Administered 2019-06-21 (×2): 1 mg via INTRAVENOUS

## 2019-06-21 MED ORDER — DIPHENHYDRAMINE HCL 12.5 MG/5ML PO ELIX: 12.5000 mg | ORAL_SOLUTION | ORAL | Status: DC | PRN

## 2019-06-21 MED ORDER — ONDANSETRON HCL 4 MG PO TABS: 4.0000 mg | ORAL_TABLET | Freq: Four times a day (QID) | ORAL | Status: DC | PRN

## 2019-06-21 MED ORDER — EPHEDRINE SULFATE 50 MG/ML IJ SOLN
INTRAMUSCULAR | Status: DC | PRN
  Administered 2019-06-21: 10 mg via INTRAVENOUS

## 2019-06-21 MED ORDER — DOCUSATE SODIUM 100 MG PO CAPS
100.0000 mg | ORAL_CAPSULE | Freq: Two times a day (BID) | ORAL | Status: DC
  Administered 2019-06-21 – 2019-06-24 (×6): 100 mg via ORAL
  Filled 2019-06-21 (×6): qty 1

## 2019-06-21 MED ORDER — MORPHINE SULFATE (PF) 2 MG/ML IV SOLN
2.0000 mg | INTRAVENOUS | Status: DC | PRN
  Administered 2019-06-21 – 2019-06-22 (×2): 2 mg via INTRAVENOUS
  Filled 2019-06-21 (×2): qty 1

## 2019-06-21 MED ORDER — BUPIVACAINE-EPINEPHRINE (PF) 0.5% -1:200000 IJ SOLN
INTRAMUSCULAR | Status: DC | PRN
  Administered 2019-06-21: 20 mL via PERINEURAL

## 2019-06-21 MED ORDER — ACETAMINOPHEN 500 MG PO TABS
1000.0000 mg | ORAL_TABLET | Freq: Four times a day (QID) | ORAL | Status: AC
  Administered 2019-06-21 – 2019-06-22 (×4): 1000 mg via ORAL
  Filled 2019-06-21 (×4): qty 2

## 2019-06-21 MED ORDER — LORAZEPAM 1 MG PO TABS
1.0000 mg | ORAL_TABLET | Freq: Two times a day (BID) | ORAL | Status: DC | PRN
  Administered 2019-06-22 – 2019-06-23 (×3): 1 mg via ORAL
  Filled 2019-06-21 (×3): qty 1

## 2019-06-21 MED ORDER — FENTANYL CITRATE (PF) 100 MCG/2ML IJ SOLN
50.0000 ug | Freq: Once | INTRAMUSCULAR | Status: AC
  Administered 2019-06-21: 50 ug via INTRAVENOUS
  Filled 2019-06-21: qty 2

## 2019-06-21 MED ORDER — ONDANSETRON HCL 4 MG/2ML IJ SOLN: 4.0000 mg | Freq: Once | INTRAMUSCULAR | Status: DC | PRN

## 2019-06-21 MED ORDER — MORPHINE SULFATE (PF) 2 MG/ML IV SOLN: 0.5000 mg | INTRAVENOUS | Status: DC | PRN

## 2019-06-21 MED ORDER — MORPHINE SULFATE (PF) 4 MG/ML IV SOLN
4.0000 mg | Freq: Once | INTRAVENOUS | Status: AC
  Administered 2019-06-21: 4 mg via INTRAVENOUS
  Filled 2019-06-21: qty 1

## 2019-06-21 MED ORDER — METHOCARBAMOL 500 MG PO TABS
500.0000 mg | ORAL_TABLET | Freq: Three times a day (TID) | ORAL | Status: DC | PRN
  Administered 2019-06-22 – 2019-06-24 (×4): 500 mg via ORAL
  Filled 2019-06-21 (×5): qty 1

## 2019-06-21 MED ORDER — FENTANYL CITRATE (PF) 100 MCG/2ML IJ SOLN
INTRAMUSCULAR | Status: DC | PRN
  Administered 2019-06-21 (×2): 50 ug via INTRAVENOUS

## 2019-06-21 MED ORDER — MAGNESIUM HYDROXIDE 400 MG/5ML PO SUSP: 30.0000 mL | Freq: Every day | ORAL | Status: DC | PRN

## 2019-06-21 MED ORDER — LEVOTHYROXINE SODIUM 100 MCG PO TABS
100.0000 ug | ORAL_TABLET | Freq: Every day | ORAL | Status: DC
  Administered 2019-06-22 – 2019-06-24 (×3): 100 ug via ORAL
  Filled 2019-06-21 (×3): qty 1

## 2019-06-21 MED ORDER — MIDAZOLAM HCL 2 MG/2ML IJ SOLN
INTRAMUSCULAR | Status: AC
  Filled 2019-06-21: qty 2

## 2019-06-21 MED ORDER — CEFAZOLIN SODIUM-DEXTROSE 2-4 GM/100ML-% IV SOLN
2.0000 g | Freq: Four times a day (QID) | INTRAVENOUS | Status: AC
  Administered 2019-06-21 – 2019-06-22 (×3): 2 g via INTRAVENOUS
  Filled 2019-06-21 (×3): qty 100

## 2019-06-21 MED ORDER — SODIUM CHLORIDE 0.9 % IV SOLN: INTRAVENOUS | Status: DC

## 2019-06-21 MED ORDER — METOPROLOL SUCCINATE ER 25 MG PO TB24
25.0000 mg | ORAL_TABLET | Freq: Every day | ORAL | Status: DC
  Administered 2019-06-22 – 2019-06-23 (×2): 25 mg via ORAL
  Filled 2019-06-21 (×3): qty 1

## 2019-06-21 MED ORDER — LACTATED RINGERS IV SOLN: INTRAVENOUS | Status: DC | PRN

## 2019-06-21 MED ORDER — METOCLOPRAMIDE HCL 5 MG/ML IJ SOLN: 5.0000 mg | Freq: Three times a day (TID) | INTRAMUSCULAR | Status: DC | PRN

## 2019-06-21 MED ORDER — POLYETHYLENE GLYCOL 3350 17 G PO PACK: 17.0000 g | PACK | Freq: Every day | ORAL | Status: DC | PRN

## 2019-06-21 MED ORDER — HYDRALAZINE HCL 20 MG/ML IJ SOLN
5.0000 mg | INTRAMUSCULAR | Status: DC | PRN
  Filled 2019-06-21: qty 0.25

## 2019-06-21 MED ORDER — DM-GUAIFENESIN ER 30-600 MG PO TB12
1.0000 | ORAL_TABLET | Freq: Two times a day (BID) | ORAL | Status: DC
  Administered 2019-06-21 – 2019-06-24 (×6): 1 via ORAL
  Filled 2019-06-21 (×6): qty 1

## 2019-06-21 MED ORDER — BUPIVACAINE HCL (PF) 0.5 % IJ SOLN
INTRAMUSCULAR | Status: DC | PRN
  Administered 2019-06-21: 3 mL via INTRATHECAL

## 2019-06-21 MED ORDER — PHENYLEPHRINE HCL (PRESSORS) 10 MG/ML IV SOLN
INTRAVENOUS | Status: DC | PRN
  Administered 2019-06-21 (×2): 100 ug via INTRAVENOUS

## 2019-06-21 MED ORDER — TRAMADOL HCL 50 MG PO TABS
50.0000 mg | ORAL_TABLET | Freq: Four times a day (QID) | ORAL | Status: DC | PRN
  Administered 2019-06-22 – 2019-06-24 (×3): 50 mg via ORAL
  Filled 2019-06-21 (×3): qty 1

## 2019-06-21 MED ORDER — OXYCODONE HCL 5 MG/5ML PO SOLN: 5.0000 mg | Freq: Once | ORAL | Status: DC | PRN

## 2019-06-21 MED ORDER — FENTANYL CITRATE (PF) 100 MCG/2ML IJ SOLN: 25.0000 ug | INTRAMUSCULAR | Status: DC | PRN

## 2019-06-21 MED ORDER — PROPOFOL 10 MG/ML IV BOLUS
INTRAVENOUS | Status: AC
  Filled 2019-06-21: qty 20

## 2019-06-21 MED ORDER — FENTANYL CITRATE (PF) 100 MCG/2ML IJ SOLN
INTRAMUSCULAR | Status: AC
  Filled 2019-06-21: qty 2

## 2019-06-21 MED ORDER — ADULT MULTIVITAMIN W/MINERALS CH
1.0000 | ORAL_TABLET | Freq: Every day | ORAL | Status: DC
  Administered 2019-06-22 – 2019-06-24 (×3): 1 via ORAL
  Filled 2019-06-21 (×3): qty 1

## 2019-06-21 MED ORDER — OXYCODONE HCL 5 MG PO TABS: 5.0000 mg | ORAL_TABLET | Freq: Once | ORAL | Status: DC | PRN

## 2019-06-21 MED ORDER — SODIUM CHLORIDE 0.9 % IV SOLN: Freq: Once | INTRAVENOUS | Status: AC

## 2019-06-21 MED ORDER — ALBUTEROL SULFATE (2.5 MG/3ML) 0.083% IN NEBU: 3.0000 mL | INHALATION_SOLUTION | RESPIRATORY_TRACT | Status: DC | PRN

## 2019-06-21 MED ORDER — FLEET ENEMA 7-19 GM/118ML RE ENEM: 1.0000 | ENEMA | Freq: Once | RECTAL | Status: DC | PRN

## 2019-06-21 MED ORDER — ACETAMINOPHEN 10 MG/ML IV SOLN: 1000.0000 mg | Freq: Once | INTRAVENOUS | Status: DC | PRN

## 2019-06-21 MED ORDER — ESCITALOPRAM OXALATE 10 MG PO TABS
5.0000 mg | ORAL_TABLET | Freq: Every day | ORAL | Status: DC
  Administered 2019-06-22 – 2019-06-24 (×3): 5 mg via ORAL
  Filled 2019-06-21 (×3): qty 0.5

## 2019-06-21 MED ORDER — PANTOPRAZOLE SODIUM 40 MG PO TBEC
40.0000 mg | DELAYED_RELEASE_TABLET | Freq: Every day | ORAL | Status: DC
  Administered 2019-06-22 – 2019-06-24 (×3): 40 mg via ORAL
  Filled 2019-06-21 (×3): qty 1

## 2019-06-21 MED ORDER — IPRATROPIUM BROMIDE 0.02 % IN SOLN: 2.5000 mL | Freq: Four times a day (QID) | RESPIRATORY_TRACT | Status: DC | PRN

## 2019-06-21 MED ORDER — METOCLOPRAMIDE HCL 10 MG PO TABS: 5.0000 mg | ORAL_TABLET | Freq: Three times a day (TID) | ORAL | Status: DC | PRN

## 2019-06-21 MED ORDER — SODIUM CHLORIDE 0.9 % IV SOLN
INTRAVENOUS | Status: DC | PRN
  Administered 2019-06-21: 50 ug/min via INTRAVENOUS

## 2019-06-21 MED ORDER — CEFAZOLIN SODIUM-DEXTROSE 2-4 GM/100ML-% IV SOLN
2.0000 g | Freq: Once | INTRAVENOUS | Status: AC
  Administered 2019-06-21: 2 g via INTRAVENOUS
  Filled 2019-06-21: qty 100

## 2019-06-21 MED ORDER — ONDANSETRON HCL 4 MG/2ML IJ SOLN: 4.0000 mg | Freq: Four times a day (QID) | INTRAMUSCULAR | Status: DC | PRN

## 2019-06-21 MED ORDER — QUETIAPINE FUMARATE 25 MG PO TABS
50.0000 mg | ORAL_TABLET | Freq: Every day | ORAL | Status: DC
  Administered 2019-06-21 – 2019-06-23 (×3): 50 mg via ORAL
  Filled 2019-06-21 (×3): qty 2

## 2019-06-21 MED ORDER — TRAZODONE HCL 100 MG PO TABS
100.0000 mg | ORAL_TABLET | Freq: Every day | ORAL | Status: DC
  Administered 2019-06-21 – 2019-06-23 (×3): 100 mg via ORAL
  Filled 2019-06-21 (×3): qty 1

## 2019-06-21 MED ORDER — ACETAMINOPHEN 325 MG PO TABS: 650.0000 mg | ORAL_TABLET | Freq: Four times a day (QID) | ORAL | Status: DC | PRN

## 2019-06-21 MED ORDER — BUPIVACAINE HCL (PF) 0.5 % IJ SOLN
INTRAMUSCULAR | Status: AC
  Filled 2019-06-21: qty 30

## 2019-06-21 SURGICAL SUPPLY — 34 items
APL PRP STRL LF DISP 70% ISPRP (MISCELLANEOUS) ×2
BIT DRILL 4.8X200 CANN (BIT) ×2 IMPLANT
BNDG COHESIVE 4X5 TAN STRL (GAUZE/BANDAGES/DRESSINGS) ×3 IMPLANT
BNDG COHESIVE 6X5 TAN STRL LF (GAUZE/BANDAGES/DRESSINGS) ×3 IMPLANT
CANISTER SUCT 1200ML W/VALVE (MISCELLANEOUS) ×3 IMPLANT
CHLORAPREP W/TINT 26 (MISCELLANEOUS) ×6 IMPLANT
COVER WAND RF STERILE (DRAPES) ×3 IMPLANT
DRSG OPSITE POSTOP 3X4 (GAUZE/BANDAGES/DRESSINGS) ×1 IMPLANT
DRSG OPSITE POSTOP 4X6 (GAUZE/BANDAGES/DRESSINGS) ×4 IMPLANT
ELECT REM PT RETURN 9FT ADLT (ELECTROSURGICAL) ×3
ELECTRODE REM PT RTRN 9FT ADLT (ELECTROSURGICAL) ×1 IMPLANT
GLOVE BIO SURGEON STRL SZ8 (GLOVE) ×6 IMPLANT
GLOVE INDICATOR 8.0 STRL GRN (GLOVE) ×3 IMPLANT
GOWN STRL REUS W/ TWL LRG LVL3 (GOWN DISPOSABLE) ×1 IMPLANT
GOWN STRL REUS W/ TWL XL LVL3 (GOWN DISPOSABLE) ×1 IMPLANT
GOWN STRL REUS W/TWL LRG LVL3 (GOWN DISPOSABLE) ×3
GOWN STRL REUS W/TWL XL LVL3 (GOWN DISPOSABLE) ×3
NDL FILTER BLUNT 18X1 1/2 (NEEDLE) ×1 IMPLANT
NEEDLE FILTER BLUNT 18X 1/2SAF (NEEDLE) ×2
NEEDLE FILTER BLUNT 18X1 1/2 (NEEDLE) ×1 IMPLANT
NEEDLE HYPO 22GX1.5 SAFETY (NEEDLE) ×3 IMPLANT
PACK HIP COMPR (MISCELLANEOUS) ×3 IMPLANT
PIN GUIDE DRILL TIP 2.8X300 (DRILL) ×6 IMPLANT
SCREW 16MM THREAD 6.5X85MM (Screw) ×2 IMPLANT
SCREW CANN THRD 6.5X80 (Screw) ×4 IMPLANT
STAPLER SKIN PROX 35W (STAPLE) ×3 IMPLANT
STRAP SAFETY 5IN WIDE (MISCELLANEOUS) ×3 IMPLANT
SUT PROLENE 2 0 FS (SUTURE) IMPLANT
SUT VIC AB 0 CT1 36 (SUTURE) ×3 IMPLANT
SUT VIC AB 0 SH 27 (SUTURE) IMPLANT
SUT VIC AB 2-0 CT1 27 (SUTURE) ×3
SUT VIC AB 2-0 CT1 TAPERPNT 27 (SUTURE) ×1 IMPLANT
SYR 20ML LL LF (SYRINGE) ×3 IMPLANT
SYR 5ML LL (SYRINGE) ×3 IMPLANT

## 2019-06-21 NOTE — Op Note (Signed)
06/21/2019  4:36 PM  Patient:   Kerry Cabrera  Pre-Op Diagnosis:   Valgus-impacted left femoral neck fracture.  Post-Op Diagnosis:   Same.  Procedure:   In situ cannulated screw fixation of valgus-impacted left femoral neck fracture.  Surgeon:   Maryagnes Amos, MD  Assistant:   None  Anesthesia:   Spinal  Findings:   As above.  Complications:   None  EBL:   10 cc  Fluids:   800 cc crystalloid  UOP:   None  TT:   None  Drains:   None  Closure:   Staples  Implants:   Biomet 6.5 mm cannulated screws (16 mm) 3  Brief Clinical Note:   The patient is an 86 year old female who sustained the above-noted injury earlier today when she slipped on some coffee on the floor and fell onto her left hip. The patient presented to the emergency room where x-rays demonstrated the above-noted findings. The patient has been cleared medically and presents at this time for definitive management of this injury.  Procedure:   The patient was brought into the operating room. After adequate spinal anesthesia was obtained, the patient was lain in the supine position on the fracture table. The uninjured leg was placed in a flexed and abducted position over the well-leg holder while the operative leg was placed in gentle longitudinal traction with some internal rotation. The adequacy of fracture position was verified fluoroscopically in AP and lateral projections before the lateral aspect of the left hip and thigh were prepped with ChloraPrep solution and draped sterilely. Preoperative antibiotics were administered. A timeout was performed to verify the appropriate surgical site.   An approximately 3-4 cm incision was made over the lateral aspect of the lower part of the greater trochanter as verified fluoroscopically. This incision was carried down through the subcutaneous cutaneous tissues to expose the iliotibial band. This was split the length the incision to expose the lateral aspect of the  proximal femur at the level of the inferior most part of the greater trochanter. Under fluoroscopic guidance, a guide wire was drilled up through the femoral neck along the calcar into the femoral head to rest within 5 mm of subchondral bone. Its position was assessed fluoroscopically in AP and lateral projections and found to be excellent. Two additional guide wires were placed in parallel fashion more proximally, anterior and posterior to the original pin in an inverted triangular configuration. Again the position of these pins was verified fluoroscopically in AP, lateral, and oblique projections and found to be excellent. The length of each of these pins was measured before each pin was overdrilled with the appropriate cannulated drill. Each of these screws was inserted and advanced to within 8-10 mm of subchondral bone. Again the position of each of these screws was assessed fluoroscopically in AP, lateral, and oblique projections, and found to be excellent.  The wound was copiously irrigated with sterile saline solution before the IT band was reapproximated using #0 Vicryl interrupted sutures. The subcutaneous tissues also were closed using 2-0 Vicryl interrupted sutures before the skin was closed using staples. A total of 20 cc of 0.5% Sensorcaine with epinephrine was injected in and around the surgical incision before a sterile occlusive dressing was applied to the wound. The patient was awakened and transferred back to his/her hospital bed. The patient then was returned to the recovery room in satisfactory condition after tolerating the procedure well.

## 2019-06-21 NOTE — Anesthesia Procedure Notes (Signed)
Spinal  Patient location during procedure: OR Start time: 06/21/2019 3:08 PM End time: 06/21/2019 3:30 PM Staffing Performed: anesthesiologist  Anesthesiologist: Arita Miss, MD Resident/CRNA: Disser, Einar Grad, CRNA Preanesthetic Checklist Completed: patient identified, IV checked, site marked, risks and benefits discussed, surgical consent, monitors and equipment checked, pre-op evaluation and timeout performed Spinal Block Patient position: right lateral decubitus Prep: ChloraPrep Patient monitoring: heart rate, continuous pulse ox, blood pressure and cardiac monitor Approach: midline Location: L3-4 Injection technique: single-shot Needle Needle type: Quincke  Needle gauge: 22 G Needle length: 9 cm Assessment Sensory level: T10 Additional Notes Negative paresthesia. Negative blood return. Positive free-flowing CSF. Expiration date of kit checked and confirmed. Patient tolerated procedure well, without complications.

## 2019-06-21 NOTE — ED Notes (Signed)
This RN called to the room due to cardiac monitor alarming. Pt O2 sat 87% on RA. Pt placed on 2L [place] and repositioned to sit up more. Pt O2 sat currently 100% on 2L. Dr. Penne Lash, MD made aware.

## 2019-06-21 NOTE — H&P (Signed)
History and Physical    Kerry Cabrera NLG:[id number] DOB: Feb 16, 1929 DOA: 06/21/2019  Referring MD/NP/PA:   PCP: [facility]   Patient coming from:  The patient is coming from assisted living facility.  At baseline, pt is partially dependent for most of ADL.        Chief Complaint: fall and left hip pain  HPI: Kerry Cabrera is a 86 y.o. female with medical history significant of hypertension, hyperlipidemia, TIA, GERD, hypothyroidism, depression with anxiety, CAD, carotid artery stenosis, who presents with a fall and left hip pain  Pt states that she fell when she was on her way to breakfast, but slipped on some spilled coffee. She injured her left hip, causing constant pain, which is sharp, severe, nonradiating.  No loss of consciousness.  Denies head or neck injury.  No neck pain or headache.  Patient does not have unilateral numbness or tingling symptoms this.  Patient has chronic mild dry cough, denies chest pain, fever or chills.  No nausea, vomiting, diarrhea or abdominal pain.  Denies symptoms of UTI. Of note, pt initially did not have oxygen desaturation in ED, but oxygen desaturated to 88% after received 1 dose of IV fentanyl per ED physician.  ED Course: pt was found to have WBC 4.9, negative urinalysis, negative COVID-19 PCR, electrolytes renal function okay, temperature normal, blood pressure 162/59, heart rate 58, RR 18, chest x-ray negative.  CT head negative.  X-ray showed left femoral neck fracture.  Patient is admitted to MedSurg bed as inpatient.  Orthopedic surgeon, Dr. Joice Lofts was consulted.  Review of Systems:   General: no fevers, chills, no body weight gain, has fatigue HEENT: no blurry vision, hearing changes or sore throat Respiratory: no dyspnea, has coughing, no wheezing CV: no chest pain, no palpitations GI: no nausea, vomiting, abdominal pain, diarrhea, constipation GU: no dysuria, burning on urination, increased urinary frequency, hematuria  Ext: no  leg edema Neuro: no unilateral weakness, numbness, or tingling, no vision change or hearing loss. Has fall. Skin: no rash, no skin tear. MSK: has left hip pain. Heme: No easy bruising.  Travel history: No recent long distant travel.  Allergy:  Allergies  Allergen Reactions  . Ace Inhibitors Cough  . Codeine Nausea And Vomiting and Nausea Only  . Gabapentin Other (See Comments)    unsure  . Micardis Hct [Telmisartan-Hctz]     unsure  . Prednisone Other (See Comments)    Pt has received cortisone injection for knee with out problems. Pt states she will not take pills for extended time due to feeling lethargic and weight gain. Weight gain  . Sulfa Antibiotics Other (See Comments) and Rash    Fever 106 degrees,  sent to ER High fever  . Zithromax [Azithromycin] Other (See Comments) and Rash    unsure    Past Medical History:  Diagnosis Date  . Arthritis   . Carotid artery occlusion   . Complication of anesthesia    very confused for days  . Coronary artery disease   . Depression   . Glaucoma   . Hyperlipemia   . Hypertension    h/o not on medication  . Hypothyroidism   . Insomnia   . PUD (peptic ulcer disease)    in distant past  . TIA (transient ischemic attack)     Past Surgical History:  Procedure Laterality Date  . CARDIAC CATHETERIZATION    . KNEE ARTHROPLASTY Left 08/08/2015   Procedure: COMPUTER ASSISTED TOTAL KNEE ARTHROPLASTY;  Surgeon: Donato Heinz, MD;  Location: [facility] ORS;  Service: Orthopedics;  Laterality: Left;  . KNEE ARTHROSCOPY    . Percutaneous pinning of right femoral neck fracture    . TONSILLECTOMY    . TOTAL HIP ARTHROPLASTY Right 02/06/2014   Dr. Deeann Saint    Social History:  reports that she has quit smoking. She quit after 12.00 years of use. She has never used smokeless tobacco. She reports current alcohol use. She reports that she does not use drugs.  Family History:  Family History  Problem Relation Age of Onset  . Prostate  cancer Son   . Kidney disease Neg Hx   . Kidney cancer Neg Hx      Prior to Admission medications   Medication Sig Start Date End Date Taking? Authorizing Provider  donepezil (ARICEPT) 10 MG tablet Take 10 mg by mouth at bedtime. 03/31/19  Yes [provider]  escitalopram (LEXAPRO) 5 MG tablet Take 5 mg by mouth daily. 05/07/19  Yes [provider]  levothyroxine (SYNTHROID) 100 MCG tablet Take 100 mcg by mouth every morning. 05/22/19  Yes [provider]  LORazepam (ATIVAN) 1 MG tablet Take 1 mg by mouth 2 (two) times daily as needed. 06/01/19  Yes [provider]  metoprolol succinate (TOPROL-XL) 25 MG 24 hr tablet Take 25 mg by mouth at bedtime. 05/07/19  Yes [provider]  Multiple Vitamin (MULTIVITAMIN) tablet Take 1 tablet by mouth daily.   Yes [provider]  omeprazole (PRILOSEC) 40 MG capsule Take 40 mg by mouth 2 (two) times daily. 03/31/19  Yes [provider]  QUEtiapine (SEROQUEL) 50 MG tablet Take 50 mg by mouth at bedtime. 05/04/19  Yes [provider]  traZODone (DESYREL) 100 MG tablet Take 100 mg by mouth at bedtime. 06/08/19  Yes [provider]  sucralfate (CARAFATE) 1 g tablet Take 1 g by mouth 4 (four) times daily. 06/01/19   [provider]    Physical Exam: Vitals:   06/21/19 1130 06/21/19 1200 06/21/19 1400 06/21/19 1637  BP: (!) 154/48 (!) 162/59 (!) 148/55 (!) (P) 122/48  Pulse: 66 66 65   Resp: 18  (!) 22   Temp:    (P) 99.3 F (37.4 C)  TempSrc:      SpO2: 99% 100% 100% (P) 99%  Weight:      Height:       General: Not in acute distress HEENT:       Eyes: PERRL, EOMI, no scleral icterus.       ENT: No discharge from the ears and nose, no pharynx injection, no tonsillar enlargement.        Neck: No JVD, no bruit, no mass felt. Heme: No neck lymph node enlargement. Cardiac: S1/S2, RRR, No murmurs, No gallops or rubs. Respiratory: No rales, wheezing, rhonchi or  rubs. GI: Soft, nondistended, nontender, no rebound pain, no organomegaly, BS present. GU: No hematuria Ext: No pitting leg edema bilaterally. 2+DP/PT pulse bilaterally. Musculoskeletal: has tenderness in left hip. Skin: No rashes.  Neuro: Alert, oriented X3, cranial nerves II-XII grossly intact, moves all extremities. Psych: Patient is not psychotic, no suicidal or hemocidal ideation.  Labs on Admission: I have personally reviewed following labs and imaging studies  CBC: Recent Labs  Lab 06/21/19 0928  WBC 4.9  NEUTROABS 3.0  HGB 11.2*  HCT 35.9*  MCV 84.1  PLT 291   Basic Metabolic Panel: Recent Labs  Lab 06/21/19 0928  NA 136  K  4.1  CL 98  CO2 30  GLUCOSE 93  BUN 15  CREATININE 0.98  CALCIUM 8.8*   GFR: Estimated Creatinine Clearance: 37.1 mL/min (by C-G formula based on SCr of 0.98 mg/dL). Liver Function Tests: No results for input(s): AST, ALT, ALKPHOS, BILITOT, PROT, ALBUMIN in the last 168 hours. No results for input(s): LIPASE, AMYLASE in the last 168 hours. No results for input(s): AMMONIA in the last 168 hours. Coagulation Profile: No results for input(s): INR, PROTIME in the last 168 hours. Cardiac Enzymes: No results for input(s): CKTOTAL, CKMB, CKMBINDEX, TROPONINI in the last 168 hours. BNP (last 3 results) No results for input(s): PROBNP in the last 8760 hours. HbA1C: No results for input(s): HGBA1C in the last 72 hours. CBG: No results for input(s): GLUCAP in the last 168 hours. Lipid Profile: No results for input(s): CHOL, HDL, LDLCALC, TRIG, CHOLHDL, LDLDIRECT in the last 72 hours. Thyroid Function Tests: No results for input(s): TSH, T4TOTAL, FREET4, T3FREE, THYROIDAB in the last 72 hours. Anemia Panel: No results for input(s): VITAMINB12, FOLATE, FERRITIN, TIBC, IRON, RETICCTPCT in the last 72 hours. Urine analysis:    Component Value Date/Time   COLORURINE YELLOW (A) 06/21/2019 0927   APPEARANCEUR CLEAR (A) 06/21/2019 0927    APPEARANCEUR Clear 06/25/2016 1321   LABSPEC 1.006 06/21/2019 0927   LABSPEC 1.008 08/27/2013 0110   PHURINE 6.0 06/21/2019 0927   GLUCOSEU NEGATIVE 06/21/2019 0927   GLUCOSEU Negative 08/27/2013 0110   HGBUR NEGATIVE 06/21/2019 0927   BILIRUBINUR NEGATIVE 06/21/2019 0927   BILIRUBINUR Negative 06/25/2016 1321   BILIRUBINUR Negative 08/27/2013 0110   KETONESUR NEGATIVE 06/21/2019 0927   PROTEINUR NEGATIVE 06/21/2019 0927   NITRITE NEGATIVE 06/21/2019 0927   LEUKOCYTESUR SMALL (A) 06/21/2019 0927   LEUKOCYTESUR Negative 08/27/2013 0110   Sepsis Labs: @LABRCNTIP (procalcitonin:4,lacticidven:4) ) Recent Results (from the past 240 hour(s))  Respiratory Panel by RT PCR (Flu A&B, Covid) - Nasopharyngeal Swab     Status: None   Collection Time: 06/21/19  1:16 PM   Specimen: Nasopharyngeal Swab  Result Value Ref Range Status   SARS Coronavirus 2 by RT PCR NEGATIVE NEGATIVE Final    Comment: (NOTE) SARS-CoV-2 target nucleic acids are NOT DETECTED. The SARS-CoV-2 RNA is generally detectable in upper respiratoy specimens during the acute phase of infection. The lowest concentration of SARS-CoV-2 viral copies this assay can detect is 131 copies/mL. A negative result does not preclude SARS-Cov-2 infection and should not be used as the sole basis for treatment or other patient management decisions. A negative result may occur with  improper specimen collection/handling, submission of specimen other than nasopharyngeal swab, presence of viral mutation(s) within the areas targeted by this assay, and inadequate number of viral copies (<131 copies/mL). A negative result must be combined with clinical observations, patient history, and epidemiological information. The expected result is Negative. Fact Sheet for Patients:  https://www.moore.com/https://www.fda.gov/media/142436/download Fact Sheet for Healthcare Providers:  https://www.young.biz/https://www.fda.gov/media/142435/download This test is not yet ap proved or cleared by the  [place] FDA and  has been authorized for detection and/or diagnosis of SARS-CoV-2 by FDA under an Emergency Use Authorization (EUA). This EUA will remain  in effect (meaning this test can be used) for the duration of the COVID-19 declaration under Section 564(b)(1) of the Act, 21 U.S.C. section 360bbb-3(b)(1), unless the authorization is terminated or revoked sooner.    Influenza A by PCR NEGATIVE NEGATIVE Final   Influenza B by PCR NEGATIVE NEGATIVE Final    Comment: (NOTE) The Xpert Xpress SARS-CoV-2/FLU/RSV assay  is intended as an aid in  the diagnosis of influenza from Nasopharyngeal swab specimens and  should not be used as a sole basis for treatment. Nasal washings and  aspirates are unacceptable for Xpert Xpress SARS-CoV-2/FLU/RSV  testing. Fact Sheet for Patients: PinkCheek.be Fact Sheet for Healthcare Providers: GravelBags.it This test is not yet approved or cleared by the [place] FDA and  has been authorized for detection and/or diagnosis of SARS-CoV-2 by  FDA under an Emergency Use Authorization (EUA). This EUA will remain  in effect (meaning this test can be used) for the duration of the  Covid-19 declaration under Section 564(b)(1) of the Act, 21  U.S.C. section 360bbb-3(b)(1), unless the authorization is  terminated or revoked. Performed at [facility], [address] [place], [place] [address]      Radiological Exams on Admission: DG Chest 1 View  Result Date: 06/21/2019 CLINICAL DATA:  Pt presents to the ED after a mechanical fall this AM. Pt slipped on spilled coffee. Pt denies LOC and head injury. On assessment, no deformity noted the L hip but pt is in a noticeable amount of pain. Hx - right hip arthroplasty 2015, HTN, former smoker. EXAM: CHEST  1 VIEW COMPARISON:  03/26/2019 FINDINGS: Cardiac silhouette normal in size. Large hiatal hernia. No mediastinal or hilar masses. Lungs are  hyperexpanded. Mild atelectasis adjacent to the hiatal hernia at the right lung base. Lungs otherwise clear. No pleural effusion or pneumothorax. Skeletal structures are grossly intact. IMPRESSION: No acute cardiopulmonary disease. Electronically Signed   By: Lajean Manes M.D.   On: 06/21/2019 11:34   CT HEAD WO CONTRAST  Result Date: 06/21/2019 CLINICAL DATA:  Pt via EMS from [facility]. Pt has a mechanical fall when she slipped on some coffee. Pt c/o L hip pain. Denies head injury and LOC. Pt on arrival is AANDOx4 Pre op for hip surgery. EXAM: CT HEAD WITHOUT CONTRAST TECHNIQUE: Contiguous axial images were obtained from the base of the skull through the vertex without intravenous contrast. COMPARISON:  03/26/2019 FINDINGS: Brain: No evidence of acute infarction, hemorrhage, hydrocephalus, extra-axial collection or mass lesion/mass effect. There is mild ventricular sulcal enlargement reflecting age-appropriate volume loss. Patchy areas of white matter hypoattenuation are noted bilaterally consistent with moderate chronic microvascular ischemic change. Vascular: No hyperdense vessel or unexpected calcification. Skull: Normal. Negative for fracture or focal lesion. Sinuses/Orbits: Globes and orbits are unremarkable. Minor left maxillary mucosal thickening. Sinuses otherwise clear. Other: None. IMPRESSION: 1. No acute intracranial abnormalities. 2. Age-appropriate volume loss and moderate chronic microvascular ischemic change. Electronically Signed   By: Lajean Manes M.D.   On: 06/21/2019 15:00   DG HIP OPERATIVE UNILAT W OR W/O PELVIS LEFT  Result Date: 06/21/2019 CLINICAL DATA:  ORIF left femoral neck fracture EXAM: OPERATIVE LEFT HIP WITH PELVIS COMPARISON:  06/21/2019 left hip radiographs FLUOROSCOPY TIME:  Fluoroscopy Time:  0 minutes 24 seconds Number of Acquired Spot Images: 3 FINDINGS: Spot fluoroscopic nondiagnostic intraoperative left hip radiographs demonstrate  transfixation of left femoral neck fracture with 3 pins in near-anatomic alignment. IMPRESSION: Intraoperative fluoroscopic guidance for ORIF left femoral neck fracture. Electronically Signed   By: Ilona Sorrel M.D.   On: 06/21/2019 16:32   DG HIP UNILAT WITH PELVIS 2-3 VIEWS LEFT  Result Date: 06/21/2019 CLINICAL DATA:  Pt presents to the ED after a mechanical fall this AM. Pt slipped on spilled coffee. Pt denies LOC and head injury. On assessment, no deformity noted the L hip but  pt is in a noticeable amount of pain. Hx - right hip arthroplasty 2015, HTN, former smoker. EXAM: DG HIP (WITH OR WITHOUT PELVIS) 2-3V LEFT COMPARISON:  12/15/2018 FINDINGS: Subcapital fracture of the left femoral neck, nondisplaced, non comminuted, but with valgus angulation. No other fractures. No bone lesions. Skeletal structures are demineralized. Right total hip arthroplasty is well-seated and aligned and unchanged. Left hip joint, SI joints and symphysis pubis are normally aligned. Soft tissues are unremarkable. IMPRESSION: 1. Nondisplaced, non comminuted subcapital fracture of the left femoral neck with valgus angulation. Electronically Signed   By: Amie Portland M.D.   On: 06/21/2019 11:35     EKG: Not done in ED, will get one.   Assessment/Plan Principal Problem:   Closed left hip fracture (HCC) Active Problems:   Acquired hypothyroidism   CAD (coronary artery disease)   Acute respiratory failure with hypoxia (HCC)   GERD (gastroesophageal reflux disease)   Depression with anxiety   Fall   Closed left hip fracture [facility]):  X-rays showed a valgus impacted left femoral neck fracture. Patient has moderate pain now. No neurovascular compromise. Orthopedic surgeon, Dr. Joice Lofts was consulted.   - will admit to Med-surg bed as inpt - Pain control: morphine prn and percocet - When necessary Zofran for nausea - Robaxin for muscle spasm - Appreciated Dr. consultation - type and cross - INR/PTT - PT/OT when able  to (not ordered now)  Acquired hypothyroidism -Hypothyroidism  CAD (coronary artery disease): No chest pain -On metoprolol  Acute respiratory failure with hypoxia [facility]): Per ED physician, patient had oxygen desaturation after received IV fentanyl pain medication.  Patient does have some chronic dry cough.  Chest x-ray negative. -Atrovent and as needed albuterol -As needed Mucinex for cough -Nasal cannula oxygen to maintain oxygen saturation above 93%  GERD (gastroesophageal reflux disease) -Protonix  Depression with anxiety: no SI or HI -Continue home medications  Fall: CT head negative. -PT/OT when able to       Perioperative Cardiac Risk: He has multiple comorbidities, including including hypertension, hyperlipidemia, TIA, hypothyroidism, CAD, carotid artery stenosis. Currently patient is partially independent of herADLs, IADLs.  She is living in independent facility. He is metoprolol at home. No recent acute cardiac issues.  Patient does not have chest pain or lege edema. Pt has oxygen desaturation to 88% on room air, which improved to 100% on 2L. Per EDP this happened after pt received IV pain meds. Patient has chronic dry cough.  Chest x-ray negative.  No chest pain, low suspicious PE. At this time point, no further work up is needed. His GUPTA score perioperative myocardial infarction or cardaic arrest is 1.82%.  I discussed the risk with patient's daughter who would like to proceed for surgery.     DVT ppx: SCD Code Status: DNR per her daughter Family Communication:   Yes, patient's daughter by phone Disposition Plan: To be determined Consults called: Ortho, Dr. Joice Lofts Admission status: Med-surg bed as inpt      Date of Service 06/21/2019    Lorretta Harp [facility]   If 7PM-7AM, please contact night-coverage [email or web address] 06/21/2019, 4:44 PM

## 2019-06-21 NOTE — ED Notes (Signed)
OR Nurse Aide here to transport pt.

## 2019-06-21 NOTE — Transfer of Care (Signed)
Immediate Anesthesia Transfer of Care Note  Patient: Kerry Cabrera  Procedure(s) Performed: CANNULATED HIP PINNING (Left Hip)  Patient Location: PACU  Anesthesia Type:Spinal  Level of Consciousness: awake, alert  and oriented  Airway & Oxygen Therapy: Patient Spontanous Breathing and Patient connected to nasal cannula oxygen  Post-op Assessment: Report given to RN and Post -op Vital signs reviewed and stable  Post vital signs: Reviewed and stable  Last Vitals:  Vitals Value Taken Time  BP    Temp    Pulse    Resp    SpO2      Last Pain:  Vitals:   06/21/19 1331  TempSrc:   PainSc: 10-Worst pain ever         Complications: No apparent anesthesia complications

## 2019-06-21 NOTE — ED Provider Notes (Addendum)
[facility] Emergency Department Provider Note  ____________________________________________   First MD Initiated Contact with Patient 06/21/19 0915     (approximate)  I have reviewed the triage vital signs and the nursing notes.   HISTORY  Chief Complaint Fall    HPI Kerry Cabrera is a 86 y.o. female  With h/o HTN, HLD, PUD, here with left hip pain. Pt reports that she was in her usual state of health this morning. She lives at [facility] [place] and was walking to the bathroom, when she was hit by the door swinging and fell onto her L hip. No head injury. No LOC. She endorses severe L hip pain that is aching, cramping, worse w/ any movement. She has not tried to walk since the fall. No h/o prior hip injuries. She is not on blood thinners.        Past Medical History:  Diagnosis Date  . Arthritis   . Carotid artery occlusion   . Complication of anesthesia    very confused for days  . Coronary artery disease   . Depression   . Glaucoma   . Hyperlipemia   . Hypertension    h/o not on medication  . Hypothyroidism   . Insomnia   . PUD (peptic ulcer disease)    in distant past  . TIA (transient ischemic attack)     Patient Active Problem List   Diagnosis Date Noted  . Closed left hip fracture (HCC) 06/21/2019  . Acute respiratory failure with hypoxia (HCC) 06/21/2019  . GERD (gastroesophageal reflux disease) 06/21/2019  . Depression with anxiety 06/21/2019  . Fall 06/21/2019  . Symptomatic bradycardia 11/05/2017  . Abnormal gait 12/17/2016  . Avascular necrosis of femoral head (HCC) 12/17/2016  . Bursitis of hip 12/17/2016  . CAD (coronary artery disease) 12/17/2016  . Closed fracture of intracapsular section of femur (HCC) 12/17/2016  . Closed transcervical fracture of femur (HCC) 12/17/2016  . Hip pain 12/17/2016  . Inflammation of sacroiliac joint (HCC) 12/17/2016  . Osteoarthritis of hip 12/17/2016  . Osteoarthritis of knee  12/17/2016  . Post-traumatic osteoarthritis 12/17/2016  . Diarrhea 09/20/2016  . Hiatal hernia 09/12/2016  . Acquired hypothyroidism 07/04/2016  . S/P total knee arthroplasty 08/08/2015  . Moderate mitral insufficiency 12/20/2014  . 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  . Hyperlipidemia, mixed 07/09/2013    Past Surgical History:  Procedure Laterality Date  . CARDIAC CATHETERIZATION    . KNEE ARTHROPLASTY Left 08/08/2015   Procedure: COMPUTER ASSISTED TOTAL KNEE ARTHROPLASTY;  Surgeon: Donato Heinz, MD;  Location: [facility] ORS;  Service: Orthopedics;  Laterality: Left;  . KNEE ARTHROSCOPY    . Percutaneous pinning of right femoral neck fracture    . TONSILLECTOMY    . TOTAL HIP ARTHROPLASTY Right 02/06/2014   Dr. Deeann Saint    Prior to Admission medications   Medication Sig Start Date End Date Taking? Authorizing Provider  donepezil (ARICEPT) 10 MG tablet Take 10 mg by mouth at bedtime. 03/31/19  Yes [provider]  escitalopram (LEXAPRO) 5 MG tablet Take 5 mg by mouth daily. 05/07/19  Yes [provider]  levothyroxine (SYNTHROID) 100 MCG tablet Take 100 mcg by mouth every morning. 05/22/19  Yes [provider]  LORazepam (ATIVAN) 1 MG tablet Take 1 mg by mouth 2 (two) times daily as needed. 06/01/19  Yes [provider]  metoprolol succinate (TOPROL-XL) 25 MG 24 hr tablet Take 25 mg by mouth at bedtime. 05/07/19  Yes [provider]  Multiple Vitamin (MULTIVITAMIN) tablet Take 1 tablet by mouth daily.   Yes [provider]  omeprazole (PRILOSEC) 40 MG capsule Take 40 mg by mouth 2 (two) times daily. 03/31/19  Yes [provider]  QUEtiapine (SEROQUEL) 50 MG tablet Take 50 mg by mouth at bedtime. 05/04/19  Yes [provider]  traZODone (DESYREL) 100 MG tablet Take 100 mg by mouth at bedtime. 06/08/19  Yes [provider]  sucralfate (CARAFATE) 1 g tablet Take 1 g by mouth 4 (four) times daily. 06/01/19   [provider]    Allergies Ace inhibitors, Codeine, Gabapentin, Micardis hct [telmisartan-hctz], Prednisone, Sulfa antibiotics, and Zithromax [azithromycin]  Family History  Problem Relation Age of Onset  . Prostate cancer Son   . Kidney disease Neg Hx   . Kidney cancer Neg Hx     Social History Social History   Tobacco Use  . Smoking status: Former Smoker    Years: 12.00  . Smokeless tobacco: Never Used  Substance Use Topics  . Alcohol use: Yes    Comment: osccasional  . Drug use: No    Review of Systems  Review of Systems  Constitutional: Negative for fatigue and fever.  HENT: Negative for congestion and sore throat.   Eyes: Negative for visual disturbance.  Respiratory: Negative for cough and shortness of breath.   Cardiovascular: Negative for chest pain.  Gastrointestinal: Negative for abdominal pain, diarrhea, nausea and vomiting.  Genitourinary: Negative for flank pain.  Musculoskeletal: Positive for arthralgias and gait problem. Negative for back pain and neck pain.  Skin: Negative for rash and wound.  Neurological: Negative for weakness.     ____________________________________________  PHYSICAL EXAM:      VITAL SIGNS: ED Triage Vitals  Enc Vitals Group     BP 06/21/19 0914 (!) 168/61     Pulse Rate 06/21/19 0914 (!) 59     Resp 06/21/19 0914 18     Temp 06/21/19 0914 98.4 F (36.9 C)     Temp Source 06/21/19 0914 Oral     SpO2 06/21/19 0914 97 %     Weight 06/21/19 0915 140 lb (63.5 kg)     Height 06/21/19 0915 5\' 6"  (1.676 m)     Head Circumference --      Peak Flow --      Pain Score 06/21/19 0915 10     Pain Loc --      Pain Edu? --      Excl. in GC? --      Physical Exam Vitals and nursing note reviewed.  Constitutional:      General: She is not in acute distress.    Appearance: She is well-developed.  HENT:     Head: Normocephalic and atraumatic.  Eyes:     Conjunctiva/sclera: Conjunctivae normal.  Cardiovascular:     Rate and  Rhythm: Normal rate and regular rhythm.     Heart sounds: Normal heart sounds.  Pulmonary:     Effort: Pulmonary effort is normal. No respiratory distress.     Breath sounds: No wheezing.  Abdominal:     General: There is no distension.  Musculoskeletal:     Cervical back: Neck supple.  Skin:    General: Skin is warm.     Capillary Refill: Capillary refill takes less than 2 seconds.     Findings: No rash.  Neurological:     Mental Status: She is alert and oriented to person, place, and time.  Motor: No abnormal muscle tone.      LOWER EXTREMITY EXAM: LEFT  INSPECTION & PALPATION: Shortening and external rotation of left hip. Marked TTP with any passive ROM. No open wounds or bruising.  SENSORY: sensation is intact to light touch in:  Superficial peroneal nerve distribution (over dorsum of foot) Deep peroneal nerve distribution (over first dorsal web space) Sural nerve distribution (over lateral aspect 5th metatarsal) Saphenous nerve distribution (over medial instep)  MOTOR:  + Motor EHL (great toe dorsiflexion) + FHL (great toe plantar flexion)  + TA (ankle dorsiflexion)  + GSC (ankle plantar flexion)  VASCULAR: 2+ dorsalis pedis and posterior tibialis pulses Capillary refill < 2 sec, toes warm and well-perfused  COMPARTMENTS: Soft, warm, well-perfused No pain with passive extension No parethesias   ____________________________________________   LABS (all labs ordered are listed, but only abnormal results are displayed)  Labs Reviewed  CBC WITH DIFFERENTIAL/PLATELET - Abnormal; Notable for the following components:      Result Value   Hemoglobin 11.2 (*)    HCT 35.9 (*)    All other components within normal limits  BASIC METABOLIC PANEL - Abnormal; Notable for the following components:   Calcium 8.8 (*)    GFR calc non Af Amer 52 (*)    GFR calc Af Amer 60 (*)    All other components within normal limits  URINALYSIS, COMPLETE (UACMP) WITH MICROSCOPIC -  Abnormal; Notable for the following components:   Color, Urine YELLOW (*)    APPearance CLEAR (*)    Leukocytes,Ua SMALL (*)    Bacteria, UA RARE (*)    All other components within normal limits  BRAIN NATRIURETIC PEPTIDE - Abnormal; Notable for the following components:   B Natriuretic Peptide 145.0 (*)    All other components within normal limits  RESPIRATORY PANEL BY RT PCR (FLU A&B, COVID)  BASIC METABOLIC PANEL  CBC  SAMPLE TO BLOOD BANK    ____________________________________________  ________________________________________  RADIOLOGY All imaging, including plain films, CT scans, and ultrasounds, independently reviewed by me, and interpretations confirmed via formal radiology reads.  ED MD interpretation:   CXR: Clear XR Hip Left: comminuted IT fx   Official radiology report(s): DG Chest 1 View  Result Date: 06/21/2019 CLINICAL DATA:  Pt presents to the ED after a mechanical fall this AM. Pt slipped on spilled coffee. Pt denies LOC and head injury. On assessment, no deformity noted the L hip but pt is in a noticeable amount of pain. Hx - right hip arthroplasty 2015, HTN, former smoker. EXAM: CHEST  1 VIEW COMPARISON:  03/26/2019 FINDINGS: Cardiac silhouette normal in size. Large hiatal hernia. No mediastinal or hilar masses. Lungs are hyperexpanded. Mild atelectasis adjacent to the hiatal hernia at the right lung base. Lungs otherwise clear. No pleural effusion or pneumothorax. Skeletal structures are grossly intact. IMPRESSION: No acute cardiopulmonary disease. Electronically Signed   By: Lajean Manes M.D.   On: 06/21/2019 11:34   CT HEAD WO CONTRAST  Result Date: 06/21/2019 CLINICAL DATA:  Pt via EMS from [facility]. Pt has a mechanical fall when she slipped on some coffee. Pt c/o L hip pain. Denies head injury and LOC. Pt on arrival is AANDOx4 Pre op for hip surgery. EXAM: CT HEAD WITHOUT CONTRAST TECHNIQUE: Contiguous axial images were  obtained from the base of the skull through the vertex without intravenous contrast. COMPARISON:  03/26/2019 FINDINGS: Brain: No evidence of acute infarction, hemorrhage, hydrocephalus, extra-axial collection or mass lesion/mass effect.  There is mild ventricular sulcal enlargement reflecting age-appropriate volume loss. Patchy areas of white matter hypoattenuation are noted bilaterally consistent with moderate chronic microvascular ischemic change. Vascular: No hyperdense vessel or unexpected calcification. Skull: Normal. Negative for fracture or focal lesion. Sinuses/Orbits: Globes and orbits are unremarkable. Minor left maxillary mucosal thickening. Sinuses otherwise clear. Other: None. IMPRESSION: 1. No acute intracranial abnormalities. 2. Age-appropriate volume loss and moderate chronic microvascular ischemic change. Electronically Signed   By: Amie Portland M.D.   On: 06/21/2019 15:00   DG HIP OPERATIVE UNILAT W OR W/O PELVIS LEFT  Result Date: 06/21/2019 CLINICAL DATA:  ORIF left femoral neck fracture EXAM: OPERATIVE LEFT HIP WITH PELVIS COMPARISON:  06/21/2019 left hip radiographs FLUOROSCOPY TIME:  Fluoroscopy Time:  0 minutes 24 seconds Number of Acquired Spot Images: 3 FINDINGS: Spot fluoroscopic nondiagnostic intraoperative left hip radiographs demonstrate transfixation of left femoral neck fracture with 3 pins in near-anatomic alignment. IMPRESSION: Intraoperative fluoroscopic guidance for ORIF left femoral neck fracture. Electronically Signed   By: Delbert Phenix M.D.   On: 06/21/2019 16:32   DG HIP UNILAT WITH PELVIS 2-3 VIEWS LEFT  Result Date: 06/21/2019 CLINICAL DATA:  Pt presents to the ED after a mechanical fall this AM. Pt slipped on spilled coffee. Pt denies LOC and head injury. On assessment, no deformity noted the L hip but pt is in a noticeable amount of pain. Hx - right hip arthroplasty 2015, HTN, former smoker. EXAM: DG HIP (WITH OR WITHOUT PELVIS) 2-3V LEFT COMPARISON:  12/15/2018  FINDINGS: Subcapital fracture of the left femoral neck, nondisplaced, non comminuted, but with valgus angulation. No other fractures. No bone lesions. Skeletal structures are demineralized. Right total hip arthroplasty is well-seated and aligned and unchanged. Left hip joint, SI joints and symphysis pubis are normally aligned. Soft tissues are unremarkable. IMPRESSION: 1. Nondisplaced, non comminuted subcapital fracture of the left femoral neck with valgus angulation. Electronically Signed   By: Amie Portland M.D.   On: 06/21/2019 11:35    ____________________________________________  PROCEDURES   Procedure(s) performed (including Critical Care):  Procedures  ____________________________________________  INITIAL IMPRESSION / MDM / ASSESSMENT AND PLAN / ED COURSE  As part of my medical decision making, I reviewed the following data within the electronic MEDICAL RECORD NUMBER Nursing notes reviewed and incorporated, Old chart reviewed, Notes from prior ED visits, and [place] Controlled Substance Database       *Arlesia B Pedrosa was evaluated in Emergency Department on 06/21/2019 for the symptoms described in the history of present illness. She was evaluated in the context of the global COVID-19 pandemic, which necessitated consideration that the patient might be at risk for infection with the SARS-CoV-2 virus that causes COVID-19. Institutional protocols and algorithms that pertain to the evaluation of patients at risk for COVID-19 are in a state of rapid change based on information released by regulatory bodies including the CDC and federal and state organizations. These policies and algorithms were followed during the patient's care in the ED.  Some ED evaluations and interventions may be delayed as a result of limited staffing during the pandemic.*     Medical Decision Making: 86 year old female here with comminuted left hip fracture after mechanical fall.  She is not on blood thinners and is adamant  she did not hit her head.  No other signs of trauma.  Patient will likely be taken to the OR with Dr. Joice Lofts today.  Will need medical clearance prior to this.  She is n.p.o. since midnight.  Screening  labs are unremarkable.  ____________________________________________  FINAL CLINICAL IMPRESSION(S) / ED DIAGNOSES  Final diagnoses:  Fall, initial encounter  Closed displaced intertrochanteric fracture of left femur, initial encounter (HCC)     MEDICATIONS GIVEN DURING THIS VISIT:  Medications  methocarbamol (ROBAXIN) tablet 500 mg ( Oral MAR Unhold 06/21/19 1736)  hydrALAZINE (APRESOLINE) injection 5 mg ( Intravenous MAR Unhold 06/21/19 1736)  albuterol (PROVENTIL) (2.5 MG/3ML) 0.083% nebulizer solution 3 mL ( Nebulization MAR Unhold 06/21/19 1736)  dextromethorphan-guaiFENesin (MUCINEX DM) 30-600 MG per 12 hr tablet 1 tablet ( Oral MAR Unhold 06/21/19 1736)  metoprolol succinate (TOPROL-XL) 24 hr tablet 25 mg (has no administration in time range)  donepezil (ARICEPT) tablet 10 mg (has no administration in time range)  escitalopram (LEXAPRO) tablet 5 mg (has no administration in time range)  LORazepam (ATIVAN) tablet 1 mg (has no administration in time range)  QUEtiapine (SEROQUEL) tablet 50 mg (has no administration in time range)  traZODone (DESYREL) tablet 100 mg (has no administration in time range)  levothyroxine (SYNTHROID) tablet 100 mcg (has no administration in time range)  pantoprazole (PROTONIX) EC tablet 40 mg (has no administration in time range)  sucralfate (CARAFATE) tablet 1 g (has no administration in time range)  multivitamin tablet 1 tablet (has no administration in time range)  polyethylene glycol (MIRALAX / GLYCOLAX) packet 17 g (has no administration in time range)  ipratropium (ATROVENT) nebulizer solution 0.5 mg (has no administration in time range)  enoxaparin (LOVENOX) injection 40 mg (has no administration in time range)  diphenhydrAMINE (BENADRYL) 12.5 MG/5ML  elixir 12.5-25 mg (has no administration in time range)  docusate sodium (COLACE) capsule 100 mg (has no administration in time range)  magnesium hydroxide (MILK OF MAGNESIA) suspension 30 mL (has no administration in time range)  bisacodyl (DULCOLAX) suppository 10 mg (has no administration in time range)  sodium phosphate (FLEET) 7-19 GM/118ML enema 1 enema (has no administration in time range)  ondansetron (ZOFRAN) tablet 4 mg (has no administration in time range)    Or  ondansetron (ZOFRAN) injection 4 mg (has no administration in time range)  metoCLOPramide (REGLAN) tablet 5-10 mg (has no administration in time range)    Or  metoCLOPramide (REGLAN) injection 5-10 mg (has no administration in time range)  0.9 %  sodium chloride infusion (has no administration in time range)  ceFAZolin (ANCEF) IVPB 2g/100 mL premix (has no administration in time range)  oxyCODONE (Oxy IR/ROXICODONE) immediate release tablet 5-10 mg (has no administration in time range)  acetaminophen (TYLENOL) tablet 1,000 mg (has no administration in time range)  traMADol (ULTRAM) tablet 50 mg (has no administration in time range)  morphine 2 MG/ML injection 2 mg (has no administration in time range)  morphine 4 MG/ML injection 4 mg (4 mg Intravenous Given 06/21/19 1021)  ondansetron (ZOFRAN) injection 4 mg (4 mg Intravenous Given 06/21/19 1020)  fentaNYL (SUBLIMAZE) injection 50 mcg (50 mcg Intravenous Given 06/21/19 1229)  0.9 %  sodium chloride infusion ( Intravenous New Bag/Given 06/21/19 1435)  ceFAZolin (ANCEF) IVPB 2g/100 mL premix ( Intravenous MAR Unhold 06/21/19 1736)     ED Discharge Orders    None       Note:  This document was prepared using Dragon voice recognition software and may include unintentional dictation errors.   Shaune PollackIsaacs, Blakeley Scheier, MD 06/21/19 [id number] [id number]    Meher Kucinski, MD 06/21/19 1759

## 2019-06-21 NOTE — ED Notes (Signed)
Pt transported to XRay 

## 2019-06-21 NOTE — Anesthesia Preprocedure Evaluation (Signed)
Anesthesia Evaluation  Patient identified by MRN, date of birth, ID band Patient awake  General Assessment Comment:Pt denies hx of anesthetic complications  Reviewed: Allergy & Precautions, NPO status , Patient's Chart, lab work & pertinent test results  History of Anesthesia Complications Negative for: history of anesthetic complications  Airway Mallampati: II  TM Distance: >3 FB Neck ROM: Full    Dental no notable dental hx. (+) Teeth Intact   Pulmonary neg pulmonary ROS, neg sleep apnea, neg COPD, Patient abstained from smoking.Not current smoker, former smoker,    Pulmonary exam normal breath sounds clear to auscultation       Cardiovascular Exercise Tolerance: Good METShypertension, Pt. on medications (-) CAD and (-) Past MI negative cardio ROS  (-) dysrhythmias  Rhythm:Regular Rate:Normal - Systolic murmurs Left ventricle: The cavity size was normal. Wall thickness was  increased in a pattern of mild LVH. Systolic function was normal.  The estimated ejection fraction was in the range of 55% to 65%.  - Mitral valve: There was mild regurgitation.    Neuro/Psych PSYCHIATRIC DISORDERS Anxiety Depression TIA   GI/Hepatic hiatal hernia, GERD  Controlled,(+)     (-) substance abuse  ,   Endo/Other  neg diabetesHypothyroidism   Renal/GU negative Renal ROS     Musculoskeletal   Abdominal   Peds  Hematology   Anesthesia Other Findings Past Medical History: No date: Arthritis No date: Carotid artery occlusion No date: Complication of anesthesia     Comment:  very confused for days No date: Coronary artery disease No date: Depression No date: Glaucoma No date: Hyperlipemia No date: Hypertension     Comment:  h/o not on medication No date: Hypothyroidism No date: Insomnia No date: PUD (peptic ulcer disease)     Comment:  in distant past No date: TIA (transient ischemic attack)   Reproductive/Obstetrics                             Anesthesia Physical Anesthesia Plan  ASA: III  Anesthesia Plan: Spinal and General/Spinal   Post-op Pain Management:    Induction: Intravenous  PONV Risk Score and Plan: 4 or greater and Ondansetron, Dexamethasone and Propofol infusion  Airway Management Planned: Natural Airway  Additional Equipment:   Intra-op Plan:   Post-operative Plan:   Informed Consent: I have reviewed the patients History and Physical, chart, labs and discussed the procedure including the risks, benefits and alternatives for the proposed anesthesia with the patient or authorized representative who has indicated his/her understanding and acceptance.       Plan Discussed with: CRNA and Surgeon  Anesthesia Plan Comments: (Discussed R/B/A of neuraxial anesthesia technique with patient: - rare risks of spinal/epidural hematoma, nerve damage, infection - Risk of PDPH - Risk of nausea and vomiting - Risk of conversion to general anesthesia and its associated risks, including sore throat, damage to lips/teeth/oropharynx, and rare risks such as cardiac and respiratory events.  Patient voiced understanding.)        Anesthesia Quick Evaluation

## 2019-06-21 NOTE — ED Triage Notes (Signed)
Pt via EMS from [facility]. Pt has a mechanical fall when she slipped on some coffee. Pt c/o L hip pain. Denies head injury and LOC. Pt on arrival is A&Ox4 and NAD at this time.

## 2019-06-21 NOTE — Consult Note (Signed)
ORTHOPAEDIC CONSULTATION  REQUESTING PHYSICIAN: Shaune Pollack, MD  Chief Complaint:   Left hip pain.  History of Present Illness: Kerry Cabrera is a 86 y.o. female with multiple medical issues, including coronary artery disease, hyperlipidemia, hypertension, hypothyroidism, peptic ulcer disease, a TIA, and depression who apparently functions quite independently in an assisted living facility.  Apparently, the patient was in her usual state of health this morning and on her way to breakfast when she apparently slipped on some spilled coffee and fell, landing on her left hip.  She was brought to the emergency room where x-rays demonstrated a valgus impacted left femoral neck fracture.  The patient denies any associated injuries.  She did not strike her head or lose consciousness.  She also denies any lightheadedness, dizziness, chest pain, shortness breath, or other symptoms which may have precipitated her fall.  Past Medical History:  Diagnosis Date  . Arthritis   . Carotid artery occlusion   . Complication of anesthesia    very confused for days  . Coronary artery disease   . Depression   . Glaucoma   . Hyperlipemia   . Hypertension    h/o not on medication  . Hypothyroidism   . Insomnia   . PUD (peptic ulcer disease)    in distant past  . TIA (transient ischemic attack)    Past Surgical History:  Procedure Laterality Date  . CARDIAC CATHETERIZATION    . KNEE ARTHROPLASTY Left 08/08/2015   Procedure: COMPUTER ASSISTED TOTAL KNEE ARTHROPLASTY;  Surgeon: Donato Heinz, MD;  Location: [facility] ORS;  Service: Orthopedics;  Laterality: Left;  . KNEE ARTHROSCOPY    . Percutaneous pinning of right femoral neck fracture    . TONSILLECTOMY    . TOTAL HIP ARTHROPLASTY Right 02/06/2014   Dr. Deeann Saint   Social History   Socioeconomic History  . Marital status: Widowed    Spouse name: Not on file  . Number of  children: Not on file  . Years of education: Not on file  . Highest education level: Not on file  Occupational History  . Not on file  Tobacco Use  . Smoking status: Former Smoker    Years: 12.00  . Smokeless tobacco: Never Used  Substance and Sexual Activity  . Alcohol use: Yes    Comment: osccasional  . Drug use: No  . Sexual activity: Not on file  Other Topics Concern  . Not on file  Social History Narrative  . Not on file   Social Determinants of Health   Financial Resource Strain:   . Difficulty of Paying Living Expenses:   Food Insecurity:   . Worried About Programme researcher, broadcasting/film/video in the Last Year:   . Barista in the Last Year:   Transportation Needs:   . Freight forwarder (Medical):   Marland Kitchen Lack of Transportation (Non-Medical):   Physical Activity:   . Days of Exercise per Week:   . Minutes of Exercise per Session:   Stress:   . Feeling of Stress :   Social Connections:   . Frequency of Communication with Friends and Family:   . Frequency of Social Gatherings with Friends and Family:   . Attends Religious Services:   . Active Member of Clubs or Organizations:   . Attends Banker Meetings:   Marland Kitchen Marital Status:    Family History  Problem Relation Age of Onset  . Prostate cancer Son   . Kidney disease Neg Hx   .  Kidney cancer Neg Hx    Allergies  Allergen Reactions  . Ace Inhibitors Cough  . Codeine Nausea And Vomiting and Nausea Only  . Gabapentin Other (See Comments)    unsure  . Micardis Hct [Telmisartan-Hctz]     unsure  . Prednisone Other (See Comments)    Pt has received cortisone injection for knee with out problems. Pt states she will not take pills for extended time due to feeling lethargic and weight gain. Weight gain  . Sulfa Antibiotics Other (See Comments) and Rash    Fever 106 degrees,  sent to ER High fever  . Zithromax [Azithromycin] Other (See Comments) and Rash    unsure   Prior to Admission medications    Medication Sig Start Date End Date Taking? Authorizing Provider  donepezil (ARICEPT) 10 MG tablet Take 10 mg by mouth at bedtime. 03/31/19  Yes [provider]  escitalopram (LEXAPRO) 5 MG tablet Take 5 mg by mouth daily. 05/07/19  Yes [provider]  levothyroxine (SYNTHROID) 100 MCG tablet Take 100 mcg by mouth every morning. 05/22/19  Yes [provider]  LORazepam (ATIVAN) 1 MG tablet Take 1 mg by mouth 2 (two) times daily as needed. 06/01/19  Yes [provider]  metoprolol succinate (TOPROL-XL) 25 MG 24 hr tablet Take 25 mg by mouth at bedtime. 05/07/19  Yes [provider]  Multiple Vitamin (MULTIVITAMIN) tablet Take 1 tablet by mouth daily.   Yes [provider]  omeprazole (PRILOSEC) 40 MG capsule Take 40 mg by mouth 2 (two) times daily. 03/31/19  Yes [provider]  QUEtiapine (SEROQUEL) 50 MG tablet Take 50 mg by mouth at bedtime. 05/04/19  Yes [provider]  traZODone (DESYREL) 100 MG tablet Take 100 mg by mouth at bedtime. 06/08/19  Yes [provider]  sucralfate (CARAFATE) 1 g tablet Take 1 g by mouth 4 (four) times daily. 06/01/19   [provider]   DG Chest 1 View  Result Date: 06/21/2019 CLINICAL DATA:  Pt presents to the ED after a mechanical fall this AM. Pt slipped on spilled coffee. Pt denies LOC and head injury. On assessment, no deformity noted the L hip but pt is in a noticeable amount of pain. Hx - right hip arthroplasty 2015, HTN, former smoker. EXAM: CHEST  1 VIEW COMPARISON:  03/26/2019 FINDINGS: Cardiac silhouette normal in size. Large hiatal hernia. No mediastinal or hilar masses. Lungs are hyperexpanded. Mild atelectasis adjacent to the hiatal hernia at the right lung base. Lungs otherwise clear. No pleural effusion or pneumothorax. Skeletal structures are grossly intact. IMPRESSION: No acute cardiopulmonary disease. Electronically Signed   By: Amie Portland M.D.   On: 06/21/2019  11:34   DG HIP UNILAT WITH PELVIS 2-3 VIEWS LEFT  Result Date: 06/21/2019 CLINICAL DATA:  Pt presents to the ED after a mechanical fall this AM. Pt slipped on spilled coffee. Pt denies LOC and head injury. On assessment, no deformity noted the L hip but pt is in a noticeable amount of pain. Hx - right hip arthroplasty 2015, HTN, former smoker. EXAM: DG HIP (WITH OR WITHOUT PELVIS) 2-3V LEFT COMPARISON:  12/15/2018 FINDINGS: Subcapital fracture of the left femoral neck, nondisplaced, non comminuted, but with valgus angulation. No other fractures. No bone lesions. Skeletal structures are demineralized. Right total hip arthroplasty is well-seated and aligned and unchanged. Left hip joint, SI joints and symphysis pubis are normally aligned. Soft tissues are unremarkable. IMPRESSION: 1. Nondisplaced, non comminuted subcapital fracture of the  left femoral neck with valgus angulation. Electronically Signed   By: Lajean Manes M.D.   On: 06/21/2019 11:35    Positive ROS: All other systems have been reviewed and were otherwise negative with the exception of those mentioned in the HPI and as above.  Physical Exam: General:  Alert, no acute distress Psychiatric:  Patient is competent for consent with normal mood and affect   Cardiovascular:  No pedal edema Respiratory:  No wheezing, non-labored breathing GI:  Abdomen is soft and non-tender Skin:  No lesions in the area of chief complaint Neurologic:  Sensation intact distally Lymphatic:  No axillary or cervical lymphadenopathy  Orthopedic Exam:  Orthopedic examination is limited to the left hip and lower extremity.  The left lower extremity appears to be appropriately aligned as compared to the right.  Skin inspection around the left hip is unremarkable.  No swelling, erythema, ecchymosis, abrasions, or other skin abnormalities are identified.  She has minimal tenderness to palpation of the lateral aspect of the left hip.  She has more severe pain with any  attempted active or passive motion of the left hip.  She is neurovascularly intact to the left foot and lower leg, demonstrating the ability to dorsiflex and plantarflex her ankle.  Sensation is intact to light touch to all distributions.  She has good capillary refill to her left foot.  X-rays:  X-rays of the pelvis and left hip are available for review and have been reviewed by myself.  These films demonstrate a valgus impacted left femoral neck fracture.  No significant degenerative changes of the left hip are noted.  No lytic lesions or other acute bony abnormalities are identified.  Assessment: Valgus impacted left femoral neck fracture.  Plan: The treatment options, including both surgical and nonsurgical choices, have been discussed in detail with the patient and her family. The patient would like to proceed with surgical intervention to include an in situ cannulated screw fixation of the valgus impacted left femoral neck fracture. The risks (including bleeding, infection, nerve and/or blood vessel injury, persistent or recurrent pain, loosening or failure of the screws, malunion or nonunion, need for further surgery, blood clots, strokes, heart attacks or arrhythmias, pneumonia, etc.) and benefits of the surgical procedure were discussed. The patient states her understanding and agrees to proceed. A formal written consent has been obtained.  Thank you for asking me to participate in the care of this most pleasant yet unfortunate woman.  I will be happy to follow her with you.   Pascal Lux, MD  Beeper #:  [phone]  06/21/2019 1:29 PM

## 2019-06-21 NOTE — ED Notes (Signed)
Pt presents to the ED after a mechanical fall this AM. Pt slipped on spilled coffee. Pt denies LOC and head injury. On assessment, no deformity noted the L hip but pt is in a noticeable amount of pain. V/S WNL. Neurological assessment all WNL.

## 2019-06-21 NOTE — Anesthesia Postprocedure Evaluation (Signed)
Anesthesia Post Note  Patient: Cailee B Bumgardner  Procedure(s) Performed: CANNULATED HIP PINNING (Left Hip)  Patient location during evaluation: PACU Anesthesia Type: Spinal Level of consciousness: oriented and awake and alert Pain management: pain level controlled Vital Signs Assessment: post-procedure vital signs reviewed and stable Respiratory status: spontaneous breathing, respiratory function stable and patient connected to nasal cannula oxygen Cardiovascular status: blood pressure returned to baseline and stable Postop Assessment: no headache, no backache and no apparent nausea or vomiting Anesthetic complications: no     Last Vitals:  Vitals:   06/21/19 2224 06/21/19 2233  BP: 134/66 (!) 149/52  Pulse: 71 77  Resp: 18 18  Temp: 37.3 C 36.8 C  SpO2: 90% 96%    Last Pain:  Vitals:   06/21/19 2233  TempSrc: Oral  PainSc: 7                  Corinda Gubler

## 2019-06-22 DIAGNOSIS — D649 Anemia, unspecified: Secondary | ICD-10-CM

## 2019-06-22 LAB — BASIC METABOLIC PANEL
Anion gap: 5 (ref 5–15)
BUN: 14 mg/dL (ref 8–23)
CO2: 26 mmol/L (ref 22–32)
Calcium: 7.7 mg/dL — ABNORMAL LOW (ref 8.9–10.3)
Chloride: 101 mmol/L (ref 98–111)
Creatinine, Ser: 0.75 mg/dL (ref 0.44–1.00)
GFR calc Af Amer: >60 mL/min (ref >60)
GFR calc non Af Amer: >60 mL/min (ref >60)
Glucose, Bld: 109 mg/dL — ABNORMAL HIGH (ref 70–99)
Potassium: 3.9 mmol/L (ref 3.5–5.1)
Sodium: 132 mmol/L — ABNORMAL LOW (ref 135–145)

## 2019-06-22 LAB — CBC
HCT: 29.1 % — ABNORMAL LOW (ref 36.0–46.0)
Hemoglobin: 9.1 g/dL — ABNORMAL LOW (ref 12.0–15.0)
MCH: 26.2 pg (ref 26.0–34.0)
MCHC: 31.3 g/dL (ref 30.0–36.0)
MCV: 83.9 fL (ref 80.0–100.0)
Platelets: 198 10*3/uL (ref 150–400)
RBC: 3.47 MIL/uL — ABNORMAL LOW (ref 3.87–5.11)
RDW: 14.6 % (ref 11.5–15.5)
WBC: 7.4 10*3/uL (ref 4.0–10.5)
nRBC: 0 % (ref 0.0–0.2)

## 2019-06-22 MED ORDER — HALOPERIDOL LACTATE 5 MG/ML IJ SOLN: 2.0000 mg | Freq: Once | INTRAMUSCULAR | Status: DC

## 2019-06-22 MED ORDER — BOOST / RESOURCE BREEZE PO LIQD CUSTOM
1.0000 | Freq: Three times a day (TID) | ORAL | Status: DC
  Administered 2019-06-22 – 2019-06-24 (×7): 1 via ORAL

## 2019-06-22 MED ORDER — HALOPERIDOL LACTATE 5 MG/ML IJ SOLN: 2.0000 mg | Freq: Four times a day (QID) | INTRAMUSCULAR | Status: DC | PRN

## 2019-06-22 NOTE — Progress Notes (Signed)
Subjective: 1 Day Post-Op Procedure(s) (LRB): CANNULATED HIP PINNING (Left) Patient reports pain as mild.   Patient is well, and has had no acute complaints or problems PT and care management to assist with discharge planning. Negative for chest pain and shortness of breath Fever: no Gastrointestinal:Negative for nausea and vomiting  Objective: Vital signs in last 24 hours: Temp:  [97.6 F (36.4 C)-99.8 F (37.7 C)] 97.9 F (36.6 C) (03/15 0757) Pulse Rate:  [58-77] 75 (03/15 0757) Resp:  [13-22] 16 (03/15 0757) BP: (102-168)/(41-89) 128/54 (03/15 0757) SpO2:  [88 %-100 %] 95 % (03/15 0757) Weight:  [63.5 kg] 63.5 kg (03/14 0915)  Intake/Output from previous day:  Intake/Output Summary (Last 24 hours) at 06/22/2019 0810 Last data filed at 06/22/2019 0400 Gross per 24 hour  Intake 1754 ml  Output 10 ml  Net 1744 ml    Intake/Output this shift: No intake/output data recorded.  Labs: Recent Labs    06/21/19 0928 06/22/19 0536  HGB 11.2* 9.1*   Recent Labs    06/21/19 0928 06/22/19 0536  WBC 4.9 7.4  RBC 4.27 3.47*  HCT 35.9* 29.1*  PLT 291 198   Recent Labs    06/21/19 0928 06/22/19 0536  NA 136 132*  K 4.1 3.9  CL 98 101  CO2 30 26  BUN 15 14  CREATININE 0.98 0.75  GLUCOSE 93 109*  CALCIUM 8.8* 7.7*   No results for input(s): LABPT, INR in the last 72 hours.   EXAM General - Patient is Alert and Appropriate Extremity - ABD soft Sensation intact distally Intact pulses distally Dorsiflexion/Plantar flexion intact Incision: dressing C/D/I No cellulitis present Dressing/Incision - clean, dry, no drainage Motor Function - intact, moving foot and toes well on exam.    Past Medical History:  Diagnosis Date  . Arthritis   . Carotid artery occlusion   . Complication of anesthesia    very confused for days  . Coronary artery disease   . Depression   . Glaucoma   . Hyperlipemia   . Hypertension    h/o not on medication  . Hypothyroidism   .  Insomnia   . PUD (peptic ulcer disease)    in distant past  . TIA (transient ischemic attack)     Assessment/Plan: 1 Day Post-Op Procedure(s) (LRB): CANNULATED HIP PINNING (Left) Principal Problem:   Closed left hip fracture (HCC) Active Problems:   Acquired hypothyroidism   CAD (coronary artery disease)   Acute respiratory failure with hypoxia (HCC)   GERD (gastroesophageal reflux disease)   Depression with anxiety   Fall  Estimated body mass index is 22.6 kg/m as calculated from the following:   Height as of this encounter: 5\' 6"  (1.676 m).   Weight as of this encounter: 63.5 kg. Advance diet Up with therapy D/C IV fluids when tolerating po intake.  Labs reviewed this AM.  Hg 9.1 this AM. Begin working with PT today. Begin working on BM.  DVT Prophylaxis - Lovenox and Foot Pumps Partial weightbearing to the left leg.  , PA-C [facility] Orthopaedic Surgery 06/22/2019, 8:10 AM

## 2019-06-22 NOTE — TOC Progression Note (Signed)
Transition of Care [facility]) - Progression Note    Patient Details  Name: ASENCION GUISINGER MRN: [id number] Date of Birth: Aug 16, 1928  Transition of Care [facility]) CM/SW Contact  Barrie Dunker, RN Phone Number: 06/22/2019, 2:28 PM  Clinical Narrative:     Spoke with the daughter Cathlean Cower, reviewed the bed offers, she has accepted the bed offer for [facility],  I notified Tresa Endo at [facility]  Expected Discharge Plan: Skilled Nursing Facility Barriers to Discharge: Continued Medical Work up  Expected Discharge Plan and Services Expected Discharge Plan: Skilled Nursing Facility       Living arrangements for the past 2 months: Assisted Living Facility                                       Social Determinants of Health (SDOH) Interventions    Readmission Risk Interventions No flowsheet data found.

## 2019-06-22 NOTE — TOC Initial Note (Signed)
Transition of Care [facility]) - Initial/Assessment Note    Patient Details  Name: Kerry Cabrera MRN: [id number] Date of Birth: 02-15-1929  [facility]) CM/SW Contact:    Su Hilt, RN Phone Number: 06/22/2019, 1:45 PM  Clinical Narrative:                 Met with the patient at the bedside to get permission to do a bed search for SNF, The patient stated she does not know why she needs to go to SNF, I explained that at SNF she would get PT daily.  She agreed to the bedseach, will review bed offers once obtained She gave permission to call her daughter, I called and left a VM for [name] to call back and provided my contact information Expected Discharge Plan: Skilled Nursing Facility Barriers to Discharge: Continued Medical Work up   Patient Goals and CMS Choice Patient states their goals for this hospitalization and ongoing recovery are:: get better      Expected Discharge Plan and Services Expected Discharge Plan: [place]       Living arrangements for the past 2 months: [place]                                      Prior Living Arrangements/Services Living arrangements for the past 2 months: [place] Lives with:: Facility Resident Patient language and need for interpreter reviewed:: Yes Do you feel safe going back to the place where you live?: Yes      Need for Family Participation in Patient Care: No (Comment) Care giver support system in place?: Yes (comment) Current home services: DME Criminal Activity/Legal Involvement Pertinent to Current Situation/Hospitalization: No - Comment as needed  Activities of Daily Living Home Assistive Devices/Equipment: Eyeglasses ADL Screening (condition at time of admission) Patient's cognitive ability adequate to safely complete daily activities?: Yes Is the patient deaf or have difficulty hearing?: No Does the patient have difficulty seeing, even when wearing  glasses/contacts?: No Does the patient have difficulty concentrating, remembering, or making decisions?: No Patient able to express need for assistance with ADLs?: Yes Does the patient have difficulty dressing or bathing?: No Independently performs ADLs?: Yes (appropriate for developmental age) Does the patient have difficulty walking or climbing stairs?: Yes Weakness of Legs: None Weakness of Arms/Hands: None  Permission Sought/Granted   Permission granted to share information with : Yes, Verbal Permission Granted              Emotional Assessment Appearance:: Appears stated age Attitude/Demeanor/Rapport: Engaged Affect (typically observed): Appropriate Orientation: : Oriented to Self, Oriented to Place, Oriented to  Time, Oriented to Situation Alcohol / Substance Use: Not Applicable Psych Involvement: No (comment)  Admission diagnosis:  Surgery, elective [Z41.9] Fall [W19.XXXA] Closed left hip fracture ([place]) [S72.002A] Patient Active Problem List   Diagnosis Date Noted  . Closed left hip fracture ([place]) 06/21/2019  . Acute respiratory failure with hypoxia ([place]) 06/21/2019  . GERD (gastroesophageal reflux disease) 06/21/2019  . Depression with anxiety 06/21/2019  . Fall 06/21/2019  . Symptomatic bradycardia 11/05/2017  . Abnormal gait 12/17/2016  . Avascular necrosis of femoral head ([place]) 12/17/2016  . Bursitis of hip 12/17/2016  . CAD (coronary artery disease) 12/17/2016  . Closed fracture of intracapsular section of femur ([place]) 12/17/2016  . Closed transcervical fracture of femur ([place]) 12/17/2016  . Hip pain 12/17/2016  .  Inflammation of sacroiliac joint ([place]) 12/17/2016  . Osteoarthritis of hip 12/17/2016  . Osteoarthritis of knee 12/17/2016  . Post-traumatic osteoarthritis 12/17/2016  . Diarrhea 09/20/2016  . Hiatal hernia 09/12/2016  . Acquired hypothyroidism 07/04/2016  . S/P total knee arthroplasty 08/08/2015  . Moderate mitral insufficiency 12/20/2014  .  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  . Hyperlipidemia, mixed 07/09/2013   PCP:  [place] [facility]:   [id number] [address], [address] [place] [address] Phone: [phone] Fax: [place], [place] [address] [place] [address] Phone: [phone] Fax: [phone]  [place], [place] [address] Phone: [phone] Fax: [phone]     Social Determinants of Health (SDOH) Interventions    Readmission Risk Interventions No flowsheet data found.

## 2019-06-22 NOTE — Progress Notes (Signed)
PROGRESS NOTE    Kerry Cabrera  LKG:[id number] DOB: 1928-11-25 DOA: 06/21/2019 PCP: [facility]       Assessment & Plan:   Principal Problem:   Closed left hip fracture (HCC) Active Problems:   Acquired hypothyroidism   CAD (coronary artery disease)   Acute respiratory failure with hypoxia (HCC)   GERD (gastroesophageal reflux disease)   Depression with anxiety   Fall   Closed left hip fracture: secondary to a fall. S/p cannulated screw fixation of valgus impacted left femoral neck fracture as per ortho surg on 06/21/19. Morphine & percocet prn for pain. Zofran prn for nausea/vomiting. Ortho surg following and recs apprec. PT consulted  Normocytic anemia: possibly some post-op anemia. No need for a transfusion at this time. Will continue to monitor   Acquired hypothyroidism: continue on levothyroxine   CAD: No chest pain. Continue on metoprolol  Acute respiratory failure with hypoxia: etiology unclear. CXR negative. Continue w/ bronchodilators. Mucinex prn for cough. Continue on supplemental oxygen and wean as tolerated. Encourage incentive spirometry   GERD: continue on protonix  Depression: with anxiety. No SI or HI. Severity unknown. Continue on home dose of escitalopram, ativan       DVT prophylaxis: lovenox Code Status: DNR Family Communication:  Disposition Plan: depends on PT/OT recs   Consultants:   Ortho surg   Procedures:    Antimicrobials:    Subjective: Pt c/o left hip pain   Objective: Vitals:   06/21/19 2224 06/21/19 2233 06/21/19 2340 06/22/19 0405  BP: 134/66 (!) 149/52 (!) 102/41 132/74  Pulse: 71 77 68 72  Resp: 18 18 18 18   Temp: 99.2 F (37.3 C) 98.3 F (36.8 C) 99.8 F (37.7 C) 98.6 F (37 C)  TempSrc: Oral Oral Oral Oral  SpO2: 90% 96% 96% 94%  Weight:      Height:        Intake/Output Summary (Last 24 hours) at 06/22/2019 0731 Last data filed at 06/22/2019 0400 Gross per 24 hour  Intake 1754 ml    Output 10 ml  Net 1744 ml   Filed Weights   06/21/19 0915  Weight: 63.5 kg    Examination:  General exam: Appears calm and comfortable  Respiratory system: decreased breath sounds b/l. No rales Cardiovascular system: S1 & S2 +. No  rubs, gallops or clicks.  Gastrointestinal system: Abdomen is nondistended, soft and nontender.  Normal bowel sounds heard. Central nervous system: Alert and oriented to self. Psychiatry: Judgement and insight appear abnormal. Flat mood and affect    Data Reviewed: I have personally reviewed following labs and imaging studies  CBC: Recent Labs  Lab 06/21/19 0928 06/22/19 0536  WBC 4.9 7.4  NEUTROABS 3.0  --   HGB 11.2* 9.1*  HCT 35.9* 29.1*  MCV 84.1 83.9  PLT 291 198   Basic Metabolic Panel: Recent Labs  Lab 06/21/19 0928 06/22/19 0536  NA 136 132*  K 4.1 3.9  CL 98 101  CO2 30 26  GLUCOSE 93 109*  BUN 15 14  CREATININE 0.98 0.75  CALCIUM 8.8* 7.7*   GFR: Estimated Creatinine Clearance: 45.5 mL/min (by C-G formula based on SCr of 0.75 mg/dL). Liver Function Tests: No results for input(s): AST, ALT, ALKPHOS, BILITOT, PROT, ALBUMIN in the last 168 hours. No results for input(s): LIPASE, AMYLASE in the last 168 hours. No results for input(s): AMMONIA in the last 168 hours. Coagulation Profile: No results for input(s): INR, PROTIME in the last 168 hours. Cardiac Enzymes:  No results for input(s): CKTOTAL, CKMB, CKMBINDEX, TROPONINI in the last 168 hours. BNP (last 3 results) No results for input(s): PROBNP in the last 8760 hours. HbA1C: No results for input(s): HGBA1C in the last 72 hours. CBG: No results for input(s): GLUCAP in the last 168 hours. Lipid Profile: No results for input(s): CHOL, HDL, LDLCALC, TRIG, CHOLHDL, LDLDIRECT in the last 72 hours. Thyroid Function Tests: No results for input(s): TSH, T4TOTAL, FREET4, T3FREE, THYROIDAB in the last 72 hours. Anemia Panel: No results for input(s): VITAMINB12, FOLATE,  FERRITIN, TIBC, IRON, RETICCTPCT in the last 72 hours. Sepsis Labs: No results for input(s): PROCALCITON, LATICACIDVEN in the last 168 hours.  Recent Results (from the past 240 hour(s))  Respiratory Panel by RT PCR (Flu A&B, Covid) - Nasopharyngeal Swab     Status: None   Collection Time: 06/21/19  1:16 PM   Specimen: Nasopharyngeal Swab  Result Value Ref Range Status   SARS Coronavirus 2 by RT PCR NEGATIVE NEGATIVE Final    Comment: (NOTE) SARS-CoV-2 target nucleic acids are NOT DETECTED. The SARS-CoV-2 RNA is generally detectable in upper respiratoy specimens during the acute phase of infection. The lowest concentration of SARS-CoV-2 viral copies this assay can detect is 131 copies/mL. A negative result does not preclude SARS-Cov-2 infection and should not be used as the sole basis for treatment or other patient management decisions. A negative result may occur with  improper specimen collection/handling, submission of specimen other than nasopharyngeal swab, presence of viral mutation(s) within the areas targeted by this assay, and inadequate number of viral copies (<131 copies/mL). A negative result must be combined with clinical observations, patient history, and epidemiological information. The expected result is Negative. Fact Sheet for Patients:  PinkCheek.be Fact Sheet for Healthcare Providers:  GravelBags.it This test is not yet ap proved or cleared by the [place] FDA and  has been authorized for detection and/or diagnosis of SARS-CoV-2 by FDA under an Emergency Use Authorization (EUA). This EUA will remain  in effect (meaning this test can be used) for the duration of the COVID-19 declaration under Section 564(b)(1) of the Act, 21 U.S.C. section 360bbb-3(b)(1), unless the authorization is terminated or revoked sooner.    Influenza A by PCR NEGATIVE NEGATIVE Final   Influenza B by PCR NEGATIVE NEGATIVE Final     Comment: (NOTE) The Xpert Xpress SARS-CoV-2/FLU/RSV assay is intended as an aid in  the diagnosis of influenza from Nasopharyngeal swab specimens and  should not be used as a sole basis for treatment. Nasal washings and  aspirates are unacceptable for Xpert Xpress SARS-CoV-2/FLU/RSV  testing. Fact Sheet for Patients: PinkCheek.be Fact Sheet for Healthcare Providers: GravelBags.it This test is not yet approved or cleared by the [place] FDA and  has been authorized for detection and/or diagnosis of SARS-CoV-2 by  FDA under an Emergency Use Authorization (EUA). This EUA will remain  in effect (meaning this test can be used) for the duration of the  Covid-19 declaration under Section 564(b)(1) of the Act, 21  U.S.C. section 360bbb-3(b)(1), unless the authorization is  terminated or revoked. Performed at [facility], [address] [place], [place] [address]          Radiology Studies: DG Chest 1 View  Result Date: 06/21/2019 CLINICAL DATA:  Pt presents to the ED after a mechanical fall this AM. Pt slipped on spilled coffee. Pt denies LOC and head injury. On assessment, no deformity noted the L hip but pt is in a noticeable amount  of pain. Hx - right hip arthroplasty 2015, HTN, former smoker. EXAM: CHEST  1 VIEW COMPARISON:  03/26/2019 FINDINGS: Cardiac silhouette normal in size. Large hiatal hernia. No mediastinal or hilar masses. Lungs are hyperexpanded. Mild atelectasis adjacent to the hiatal hernia at the right lung base. Lungs otherwise clear. No pleural effusion or pneumothorax. Skeletal structures are grossly intact. IMPRESSION: No acute cardiopulmonary disease. Electronically Signed   By: Amie Portland M.D.   On: 06/21/2019 11:34   CT HEAD WO CONTRAST  Result Date: 06/21/2019 CLINICAL DATA:  Pt via EMS from [facility]. Pt has a mechanical fall when she slipped on some  coffee. Pt c/o L hip pain. Denies head injury and LOC. Pt on arrival is AANDOx4 Pre op for hip surgery. EXAM: CT HEAD WITHOUT CONTRAST TECHNIQUE: Contiguous axial images were obtained from the base of the skull through the vertex without intravenous contrast. COMPARISON:  03/26/2019 FINDINGS: Brain: No evidence of acute infarction, hemorrhage, hydrocephalus, extra-axial collection or mass lesion/mass effect. There is mild ventricular sulcal enlargement reflecting age-appropriate volume loss. Patchy areas of white matter hypoattenuation are noted bilaterally consistent with moderate chronic microvascular ischemic change. Vascular: No hyperdense vessel or unexpected calcification. Skull: Normal. Negative for fracture or focal lesion. Sinuses/Orbits: Globes and orbits are unremarkable. Minor left maxillary mucosal thickening. Sinuses otherwise clear. Other: None. IMPRESSION: 1. No acute intracranial abnormalities. 2. Age-appropriate volume loss and moderate chronic microvascular ischemic change. Electronically Signed   By: Amie Portland M.D.   On: 06/21/2019 15:00   DG HIP OPERATIVE UNILAT W OR W/O PELVIS LEFT  Result Date: 06/21/2019 CLINICAL DATA:  ORIF left femoral neck fracture EXAM: OPERATIVE LEFT HIP WITH PELVIS COMPARISON:  06/21/2019 left hip radiographs FLUOROSCOPY TIME:  Fluoroscopy Time:  0 minutes 24 seconds Number of Acquired Spot Images: 3 FINDINGS: Spot fluoroscopic nondiagnostic intraoperative left hip radiographs demonstrate transfixation of left femoral neck fracture with 3 pins in near-anatomic alignment. IMPRESSION: Intraoperative fluoroscopic guidance for ORIF left femoral neck fracture. Electronically Signed   By: Delbert Phenix M.D.   On: 06/21/2019 16:32   DG HIP UNILAT WITH PELVIS 2-3 VIEWS LEFT  Result Date: 06/21/2019 CLINICAL DATA:  Pt presents to the ED after a mechanical fall this AM. Pt slipped on spilled coffee. Pt denies LOC and head injury. On assessment, no deformity noted the L  hip but pt is in a noticeable amount of pain. Hx - right hip arthroplasty 2015, HTN, former smoker. EXAM: DG HIP (WITH OR WITHOUT PELVIS) 2-3V LEFT COMPARISON:  12/15/2018 FINDINGS: Subcapital fracture of the left femoral neck, nondisplaced, non comminuted, but with valgus angulation. No other fractures. No bone lesions. Skeletal structures are demineralized. Right total hip arthroplasty is well-seated and aligned and unchanged. Left hip joint, SI joints and symphysis pubis are normally aligned. Soft tissues are unremarkable. IMPRESSION: 1. Nondisplaced, non comminuted subcapital fracture of the left femoral neck with valgus angulation. Electronically Signed   By: Amie Portland M.D.   On: 06/21/2019 11:35        Scheduled Meds: . acetaminophen  1,000 mg Oral Q6H  . dextromethorphan-guaiFENesin  1 tablet Oral BID  . docusate sodium  100 mg Oral BID  . donepezil  10 mg Oral QHS  . enoxaparin (LOVENOX) injection  40 mg Subcutaneous Q24H  . escitalopram  5 mg Oral Daily  . levothyroxine  100 mcg Oral Q0600  . metoprolol succinate  25 mg Oral QHS  . multivitamin with minerals  1 tablet Oral  Daily  . pantoprazole  40 mg Oral Daily  . QUEtiapine  50 mg Oral QHS  . sucralfate  1 g Oral QID  . traZODone  100 mg Oral QHS   Continuous Infusions: . sodium chloride Stopped (06/22/19 0333)  .  ceFAZolin (ANCEF) IV 2 g (06/22/19 0333)     LOS: 1 day    Time spent: 30 mins    Charise Killian, MD [facility] Pager [phone]  If 7PM-7AM, please contact night-coverage [email or web address] 06/22/2019, 7:31 AM

## 2019-06-22 NOTE — [place] FL2 (Signed)
[place] MEDICAID FL2 LEVEL OF CARE SCREENING TOOL     IDENTIFICATION  Patient Name: Kerry Cabrera Birthdate: 09-13-1928 Sex: female Admission Date (Current Location): 06/21/2019  [place] and [place] Number:  Chiropodist and Address:  [facility], [address] [place], [place] [address]      Provider Number: [id number]  Attending Physician Name and Address:  Charise Killian, MD  Relative Name and Phone Number:  Virgilio Belling [phone]    Current Level of Care: Hospital Recommended Level of Care: Skilled Nursing Facility Prior Approval Number:    Date Approved/Denied:   PASRR Number: [id number]  Discharge Plan: SNF    Current Diagnoses: Patient Active Problem List   Diagnosis Date Noted  . Closed left hip fracture (HCC) 06/21/2019  . Acute respiratory failure with hypoxia (HCC) 06/21/2019  . GERD (gastroesophageal reflux disease) 06/21/2019  . Depression with anxiety 06/21/2019  . Fall 06/21/2019  . Symptomatic bradycardia 11/05/2017  . Abnormal gait 12/17/2016  . Avascular necrosis of femoral head (HCC) 12/17/2016  . Bursitis of hip 12/17/2016  . CAD (coronary artery disease) 12/17/2016  . Closed fracture of intracapsular section of femur (HCC) 12/17/2016  . Closed transcervical fracture of femur (HCC) 12/17/2016  . Hip pain 12/17/2016  . Inflammation of sacroiliac joint (HCC) 12/17/2016  . Osteoarthritis of hip 12/17/2016  . Osteoarthritis of knee 12/17/2016  . Post-traumatic osteoarthritis 12/17/2016  . Diarrhea 09/20/2016  . Hiatal hernia 09/12/2016  . Acquired hypothyroidism 07/04/2016  . S/P total knee arthroplasty 08/08/2015  . Moderate mitral insufficiency 12/20/2014  . 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  . Hyperlipidemia, mixed 07/09/2013    Orientation RESPIRATION BLADDER Height & Weight     Self, Time, Situation, Place  Normal Continent Weight: 63.5 kg Height:  5\' 6"  (167.6 cm)  BEHAVIORAL SYMPTOMS/MOOD  NEUROLOGICAL BOWEL NUTRITION STATUS      Continent    AMBULATORY STATUS COMMUNICATION OF NEEDS Skin   Extensive Assist Verbally Surgical wounds                       Personal Care Assistance Level of Assistance  Dressing     Dressing Assistance: Limited assistance     Functional Limitations Info             SPECIAL CARE FACTORS FREQUENCY  PT (By licensed PT)     PT Frequency: 5 times per week              Contractures Contractures Info: Not present    Additional Factors Info  Code Status, Allergies Code Status Info: DNR Allergies Info: Ace Inhibitors, Codeine, Gabapentin, Micardis Hct Telmisartan-hctz, Prednisone, Sulfa Antibiotics, Zithromax           Current Medications (06/22/2019):  This is the current hospital active medication list Current Facility-Administered Medications  Medication Dose Route Frequency Provider Last Rate Last Admin  . 0.9 %  sodium chloride infusion   Intravenous Continuous Poggi, 06/24/2019, MD 75 mL/hr at 06/22/19 1146 New Bag at 06/22/19 1146  . albuterol (PROVENTIL) (2.5 MG/3ML) 0.083% nebulizer solution 3 mL  3 mL Nebulization Q4H PRN Poggi, 06/24/19, MD      . bisacodyl (DULCOLAX) suppository 10 mg  10 mg Rectal Daily PRN Poggi, Excell Seltzer, MD      . dextromethorphan-guaiFENesin (MUCINEX DM) 30-600 MG per 12 hr tablet 1 tablet  1 tablet Oral BID Poggi, Excell Seltzer, MD   1 tablet at 06/22/19 0959  . diphenhydrAMINE (  BENADRYL) 12.5 MG/5ML elixir 12.5-25 mg  12.5-25 mg Oral Q4H PRN Poggi, Marshall Cork, MD      . docusate sodium (COLACE) capsule 100 mg  100 mg Oral BID Corky Mull, MD   100 mg at 06/22/19 0958  . donepezil (ARICEPT) tablet 10 mg  10 mg Oral QHS Ivor Costa, MD   10 mg at 06/21/19 2037  . enoxaparin (LOVENOX) injection 40 mg  40 mg Subcutaneous Q24H Poggi, Marshall Cork, MD   40 mg at 06/22/19 0258  . escitalopram (LEXAPRO) tablet 5 mg  5 mg Oral Daily Ivor Costa, MD   5 mg at 06/22/19 0959  . feeding supplement (BOOST / RESOURCE BREEZE)  liquid 1 Container  1 Container Oral TID BM Wyvonnia Dusky, MD      . hydrALAZINE (APRESOLINE) injection 5 mg  5 mg Intravenous Q2H PRN Poggi, Marshall Cork, MD      . ipratropium (ATROVENT) nebulizer solution 0.5 mg  2.5 mL Nebulization Q6H PRN Ivor Costa, MD      . levothyroxine (SYNTHROID) tablet 100 mcg  100 mcg Oral Q0600 Ivor Costa, MD   100 mcg at 06/22/19 5277  . LORazepam (ATIVAN) tablet 1 mg  1 mg Oral BID PRN Ivor Costa, MD   1 mg at 06/22/19 0036  . magnesium hydroxide (MILK OF MAGNESIA) suspension 30 mL  30 mL Oral Daily PRN Poggi, Marshall Cork, MD      . methocarbamol (ROBAXIN) tablet 500 mg  500 mg Oral Q8H PRN Poggi, Marshall Cork, MD      . metoCLOPramide (REGLAN) tablet 5 mg  5 mg Oral Q8H PRN Poggi, Marshall Cork, MD       Or  . metoCLOPramide (REGLAN) injection 5 mg  5 mg Intravenous Q8H PRN Poggi, Marshall Cork, MD      . metoprolol succinate (TOPROL-XL) 24 hr tablet 25 mg  25 mg Oral QHS Ivor Costa, MD      . morphine 2 MG/ML injection 2 mg  2 mg Intravenous Q2H PRN Poggi, Marshall Cork, MD   2 mg at 06/22/19 0332  . multivitamin with minerals tablet 1 tablet  1 tablet Oral Daily Ivor Costa, MD   1 tablet at 06/22/19 [phone]  . ondansetron (ZOFRAN) tablet 4 mg  4 mg Oral Q6H PRN Poggi, Marshall Cork, MD       Or  . ondansetron (ZOFRAN) injection 4 mg  4 mg Intravenous Q6H PRN Poggi, Marshall Cork, MD      . oxyCODONE (Oxy IR/ROXICODONE) immediate release tablet 5-10 mg  5-10 mg Oral Q4H PRN Poggi, Marshall Cork, MD   5 mg at 06/22/19 0820  . pantoprazole (PROTONIX) EC tablet 40 mg  40 mg Oral Daily Ivor Costa, MD   40 mg at 06/22/19 0958  . polyethylene glycol (MIRALAX / GLYCOLAX) packet 17 g  17 g Oral Daily PRN Ivor Costa, MD      . QUEtiapine (SEROQUEL) tablet 50 mg  50 mg Oral QHS Ivor Costa, MD   50 mg at 06/21/19 2036  . sodium phosphate (FLEET) 7-19 GM/118ML enema 1 enema  1 enema Rectal Once PRN Poggi, Marshall Cork, MD      . sucralfate (CARAFATE) tablet 1 g  1 g Oral QID Ivor Costa, MD   1 g at 06/22/19 0958  . traMADol (ULTRAM)  tablet 50 mg  50 mg Oral Q6H PRN Poggi, Marshall Cork, MD   50 mg at 06/22/19 0036  . traZODone (DESYREL) tablet  100 mg  100 mg Oral QHS Lorretta Harp, MD   100 mg at 06/21/19 2037     Discharge Medications: Please see discharge summary for a list of discharge medications.  Relevant Imaging Results:  Relevant Lab Results:   Additional Information SS# [id number]  Barrie Dunker, RN

## 2019-06-22 NOTE — Progress Notes (Addendum)
Initial Nutrition Assessment  DOCUMENTATION CODES:   Not applicable  INTERVENTION:   Boost Breeze po TID, each supplement provides 250 kcal and 9 grams of protein  MVI daily   Recommend Oscal w/ D po BID   NUTRITION DIAGNOSIS:   Increased nutrient needs related to hip fracture as evidenced by increased estimated needs.  GOAL:   Patient will meet greater than or equal to 90% of their needs  MONITOR:   PO intake, Labs, Weight trends, Skin, I & O's  REASON FOR ASSESSMENT:   Malnutrition Screening Tool    ASSESSMENT:   86 y.o. female with medical history significant of dementia, hypertension, hyperlipidemia, TIA, GERD, hypothyroidism, depression with anxiety, CAD, carotid artery stenosis, who presents with a fall and hip fracture now s/p repair 3/14   Met with pt in room today. Pt sleeping at time of RD visit; pt did wake up for a few seconds to answer some questions but then fell back to sleep. Pt is a poor historian. Pt unable to tell me whether or not she has eaten anything today. Pt's lunch tray was sitting on side table untouched. Pt does shake her head that she is willing to drink supplements. RD will add supplements to help pt meet her estimated needs. Per chart, pt appears fairly weight stable at baseline.   Medications reviewed and include: colace, lovenox, synthroid, MVI, protonix, carafate, trazodone, NaCl '@75ml' /hr  Labs reviewed: Na 132(L) Hgb 9.1(L), Hct 29.1(L)  NUTRITION - FOCUSED PHYSICAL EXAM:    Most Recent Value  Orbital Region  No depletion  Upper Arm Region  No depletion  Thoracic and Lumbar Region  No depletion  Buccal Region  No depletion  Temple Region  No depletion  Clavicle Bone Region  No depletion  Clavicle and Acromion Bone Region  No depletion  Scapular Bone Region  No depletion  Dorsal Hand  Moderate depletion  Patellar Region  Moderate depletion  Anterior Thigh Region  Mild depletion  Posterior Calf Region  Mild depletion  Edema (RD  Assessment)  None  Hair  Reviewed  Eyes  Reviewed  Mouth  Reviewed  Skin  Reviewed  Nails  Reviewed     Diet Order:   Diet Order            Diet clear liquid Room service appropriate? Yes; Fluid consistency: Thin  Diet effective now             EDUCATION NEEDS:   Education needs have been addressed  Skin:  Skin Assessment: Reviewed RN Assessment(incision hip)  Last BM:  3/13  Height:   Ht Readings from Last 1 Encounters:  06/21/19 '5\' 6"'  (1.676 m)    Weight:   Wt Readings from Last 1 Encounters:  06/21/19 63.5 kg    Ideal Body Weight:  59 kg  BMI:  Body mass index is 22.6 kg/m.  Estimated Nutritional Needs:   Kcal:  1400-1600kcal/day  Protein:  70-80g/day  Fluid:  1.4L/day  Koleen Distance MS, RD, LDN Contact information available in Amion

## 2019-06-22 NOTE — Progress Notes (Signed)
Physical Therapy Treatment Patient Details Name: Kerry Cabrera MRN: [id number] DOB: [birth year] Today's Date: 06/22/2019    History of Present Illness Per MD notes: Pt is a 86 y/o F with valgus-impacted left femoral neck fracture following a fall who is s/p in situ cannulated screw fixation. PMH includes HTN, HLD, anxiety, depression, GERD, CAD, TIA, and PUD.    PT Comments    Pt presented as confused and perseverated on several topics including where she was, what happened, and who I was. Even though pt had decreased orientation to her current situation, pt followed commands better this afternoon than she did this morning. Pt was able to complete some standing exercises but was unable to demonstrate understanding of sequencing for PWB gait so gait was not attempted.  Pt only required min assist for bed mobility and transfers this afternoon and was able to complete several therex exercises. Even with physical improvements, pt is unsafe to return to prior living situation secondary to inability to amb and high risk for future falls. Pt will benefit from PT services in a SNF setting upon discharge to safely address deficits listed in patient problem list for decreased caregiver assistance and eventual return to PLOF.    Follow Up Recommendations  SNF     Equipment Recommendations  Rolling walker with 5" wheels;3in1 (PT)    Recommendations for Other Services       Precautions / Restrictions Precautions Precautions: Fall Restrictions Weight Bearing Restrictions: Yes LLE Weight Bearing: Partial weight bearing    Mobility  Bed Mobility Overal bed mobility: Needs Assistance Bed Mobility: Supine to Sit;Sit to Supine   Supine to sit: Min assist Sit to supine: Min assist   General bed mobility comments: Pt was generally able to sit up and lay back down with occasional min assist for control of her LE's  Transfers Overall transfer level: Needs assistance Equipment used: Rolling walker  (2 wheeled) Transfers: Sit to/from Stand Sit to Stand: Min assist;From elevated surface         General transfer comment: Max verbal and visual cueing for hand placement to initate transfer. Fair eccentric and eccentric control - min assist needed  Ambulation/Gait             General Gait Details: Did not attempt due to inability to follow PWB precautions.   Stairs             Wheelchair Mobility    Modified Rankin (Stroke Patients Only)       Balance Overall balance assessment: Needs assistance Sitting-balance support: No upper extremity supported;Feet unsupported Sitting balance-Leahy Scale: Good Sitting balance - Comments: Able to maintain upright seated posture during therex without any UE support   Standing balance support: Bilateral upper extremity supported;During functional activity Standing balance-Leahy Scale: Fair Standing balance comment: mod BUE from RW                            Cognition Arousal/Alertness: Awake/alert Behavior During Therapy: Restless Overall Cognitive Status: No family/caregiver present to determine baseline cognitive functioning                                 General Comments: Pt perseverated on several topics and kept asking where she was and who we are, and what happened      Exercises Total Joint Exercises Ankle Circles/Pumps: AROM;Both;10 reps;Strengthening Long Arc Quad: AROM;Strengthening;Both;10 Theatre manager  in Standing: AROM;Strengthening;Left;15 reps Other Exercises Other Exercises: standing balance training Other Exercises: Attempted PWB sequence training - pt unable to follow commands well enough to attempt this afternoon    General Comments        Pertinent Vitals/Pain Pain Assessment: ("not too bad")    Home Living Family/patient expects to be discharged to:: Assisted living([facility])             Home Equipment: Walker - 4 wheels      Prior  Function Level of Independence: Independent      Comments: Per daughter: pt was ind with ADL's, and ambulated without an AD prior to this hospitilization. Other than this current fall, pt fell once in the past six months   PT Goals (current goals can now be found in the care plan section) Progress towards PT goals: Progressing toward goals    Frequency    BID      PT Plan      Co-evaluation              AM-PAC PT "6 Clicks" Mobility   Outcome Measure  Help needed turning from your back to your side while in a flat bed without using bedrails?: A Little Help needed moving from lying on your back to sitting on the side of a flat bed without using bedrails?: A Little Help needed moving to and from a bed to a chair (including a wheelchair)?: A Lot Help needed standing up from a chair using your arms (e.g., wheelchair or bedside chair)?: A Little Help needed to walk in hospital room?: Total Help needed climbing 3-5 steps with a railing? : Total 6 Click Score: 13    End of Session Equipment Utilized During Treatment: Gait belt;Oxygen Activity Tolerance: Patient limited by pain Patient left: in bed;with call bell/phone within reach;with bed alarm set;with SCD's reapplied Nurse Communication: Mobility status;Weight bearing status PT Visit Diagnosis: Unsteadiness on feet (R26.81);Muscle weakness (generalized) (M62.81);Other abnormalities of gait and mobility (R26.89);History of falling (Z91.81);Pain Pain - Right/Left: Left Pain - part of body: Hip     Time: 8676-1950 PT Time Calculation (min) (ACUTE ONLY): 33 min  Charges:  $Therapeutic Activity: 8-22 mins                    Veleta Miners, SPT 06/22/19 3:18 PM

## 2019-06-22 NOTE — Evaluation (Signed)
Physical Therapy Evaluation Patient Details Name: Kerry Cabrera MRN: [id number] DOB: 02-19-1929 Today's Date: 06/22/2019   History of Present Illness  Per MD notes: Pt is a 86 y/o F with valgus-impacted left femoral neck fracture following a fall who is s/p in situ cannulated screw fixation. PMH includes HTN, HLD, anxiety, depression, GERD, CAD, TIA, and PUD.    Clinical Impression  Pt presented as confused and anxious throughout the session. Pt perseverated on whether she had "gone crazy" and struggled to execute tasks due to confusion and also pain in her L hip. Pt assisted some with bed mobility but did not appear to assist at all with a sit-to-stand transfer and therefore, was unable to achieve the standing position this morning. Pt will benefit from PT services in a SNF setting upon discharge to safely address deficits listed in patient problem list for decreased caregiver assistance and eventual return to PLOF.     Follow Up Recommendations SNF    Equipment Recommendations  Rolling walker with 5" wheels;3in1 (PT)    Recommendations for Other Services       Precautions / Restrictions Precautions Precautions: Fall Restrictions Weight Bearing Restrictions: Yes LLE Weight Bearing: Partial weight bearing      Mobility  Bed Mobility Overal bed mobility: Needs Assistance Bed Mobility: Supine to Sit;Sit to Supine;Rolling Rolling: Mod assist;+2 for physical assistance(with nurse tech to get on bed pan)   Supine to sit: Mod assist;HOB elevated;Min assist Sit to supine: Mod assist;HOB elevated;Min assist   General bed mobility comments: Pt required mod assist for her LE but was able to control her trunk and sit upright with min assist  Transfers Overall transfer level: Needs assistance Equipment used: Rolling walker (2 wheeled) Transfers: Sit to/from Stand Sit to Stand: Total assist         General transfer comment: Pt was unable to stand up and did not appear to try  during the attempt. Likely due to pt's confusion level, it appeared that at one point, that the pt resisted author when attempting to provide assistance to a standing position  Ambulation/Gait             General Gait Details: NT - unable to stand-up  Stairs            Wheelchair Mobility    Modified Rankin (Stroke Patients Only)       Balance Overall balance assessment: Needs assistance Sitting-balance support: Single extremity supported;Feet unsupported Sitting balance-Leahy Scale: Fair Sitting balance - Comments: Able to maintain upright sitting posture with single UE support       Standing balance comment: NT                             Pertinent Vitals/Pain Pain Assessment: (Unable to answer pain questions)    Home Living All hx provided by daughter via phone call Family/patient expects to be discharged to:: Assisted living([facility])               Home Equipment: Gilford Rile - 4 wheels      Prior Function Level of Independence: Independent         Comments: Per daughter: pt was ind with ADL's, and ambulated without an AD prior to this hospitilization. Other than this current fall, pt fell once in the past six months     Hand Dominance        Extremity/Trunk Assessment  Lower Extremity Assessment Lower Extremity Assessment: Generalized weakness;LLE deficits/detail LLE: Unable to fully assess due to pain       Communication      Cognition Arousal/Alertness: Awake/alert Behavior During Therapy: Anxious Overall Cognitive Status: No family/caregiver present to determine baseline cognitive functioning                                 General Comments: Pt perseverated on several topics and kept asking me if she had "gone crazy". Pt repeatedly asked me what I was doing and what she was supposed to be doing- even when in the middle of a task.      General Comments      Exercises Total  Joint Exercises Ankle Circles/Pumps: (Pt appeared confused by this task and did not perform it despite visual, verbal, and tactile cueing)   Assessment/Plan    PT Assessment Patient needs continued PT services  PT Problem List Decreased strength;Decreased mobility;Decreased range of motion;Decreased knowledge of precautions;Decreased activity tolerance;Decreased balance;Decreased knowledge of use of DME;Pain       PT Treatment Interventions DME instruction;Therapeutic exercise;Gait training;Balance training;Stair training;Functional mobility training;Therapeutic activities    PT Goals (Current goals can be found in the Care Plan section)       Frequency BID   Barriers to discharge        Co-evaluation               AM-PAC PT "6 Clicks" Mobility  Outcome Measure Help needed turning from your back to your side while in a flat bed without using bedrails?: A Lot Help needed moving from lying on your back to sitting on the side of a flat bed without using bedrails?: A Lot Help needed moving to and from a bed to a chair (including a wheelchair)?: Total Help needed standing up from a chair using your arms (e.g., wheelchair or bedside chair)?: Total Help needed to walk in hospital room?: Total Help needed climbing 3-5 steps with a railing? : Total 6 Click Score: 8    End of Session Equipment Utilized During Treatment: Gait belt;Oxygen Activity Tolerance: Patient limited by pain Patient left: in bed;with call bell/phone within reach;with bed alarm set;with SCD's reapplied Nurse Communication: Mobility status;Weight bearing status PT Visit Diagnosis: Unsteadiness on feet (R26.81);Muscle weakness (generalized) (M62.81);Other abnormalities of gait and mobility (R26.89);History of falling (Z91.81);Pain Pain - Right/Left: Left Pain - part of body: Hip    Time: 6010-9323 PT Time Calculation (min) (ACUTE ONLY): 49 min   Charges:              Veleta Miners, SPT 06/22/19  3:14 PM

## 2019-06-23 DIAGNOSIS — G9341 Metabolic encephalopathy: Secondary | ICD-10-CM

## 2019-06-23 LAB — CBC
HCT: 28.2 % — ABNORMAL LOW (ref 36.0–46.0)
Hemoglobin: 9.2 g/dL — ABNORMAL LOW (ref 12.0–15.0)
MCH: 26.9 pg (ref 26.0–34.0)
MCHC: 32.6 g/dL (ref 30.0–36.0)
MCV: 82.5 fL (ref 80.0–100.0)
Platelets: 203 10*3/uL (ref 150–400)
RBC: 3.42 MIL/uL — ABNORMAL LOW (ref 3.87–5.11)
RDW: 14.6 % (ref 11.5–15.5)
WBC: 8.4 10*3/uL (ref 4.0–10.5)
nRBC: 0 % (ref 0.0–0.2)

## 2019-06-23 LAB — BASIC METABOLIC PANEL
Anion gap: 5 (ref 5–15)
BUN: 11 mg/dL (ref 8–23)
CO2: 28 mmol/L (ref 22–32)
Calcium: 7.9 mg/dL — ABNORMAL LOW (ref 8.9–10.3)
Chloride: 102 mmol/L (ref 98–111)
Creatinine, Ser: 0.67 mg/dL (ref 0.44–1.00)
GFR calc Af Amer: >60 mL/min (ref >60)
GFR calc non Af Amer: >60 mL/min (ref >60)
Glucose, Bld: 103 mg/dL — ABNORMAL HIGH (ref 70–99)
Potassium: 3.9 mmol/L (ref 3.5–5.1)
Sodium: 135 mmol/L (ref 135–145)

## 2019-06-23 LAB — SARS CORONAVIRUS 2 (TAT 6-24 HRS): SARS Coronavirus 2: NEGATIVE

## 2019-06-23 MED ORDER — ACETAMINOPHEN 325 MG PO TABS
650.0000 mg | ORAL_TABLET | Freq: Four times a day (QID) | ORAL | Status: DC | PRN
  Administered 2019-06-23: 650 mg via ORAL
  Filled 2019-06-23: qty 2

## 2019-06-23 MED ORDER — ENOXAPARIN SODIUM 40 MG/0.4ML ~~LOC~~ SOLN: 40.0000 mg | SUBCUTANEOUS | 0 refills | Status: DC

## 2019-06-23 MED ORDER — OXYCODONE HCL 5 MG PO TABS: 5.0000 mg | ORAL_TABLET | ORAL | 0 refills | Status: DC | PRN

## 2019-06-23 NOTE — Progress Notes (Signed)
PROGRESS NOTE    Kerry Cabrera  WUJ:[id number] DOB: [birth year] DOA: 06/21/2019 PCP: [facility]       Assessment & Plan:   Principal Problem:   Closed left hip fracture (HCC) Active Problems:   Acquired hypothyroidism   CAD (coronary artery disease)   Acute respiratory failure with hypoxia (HCC)   GERD (gastroesophageal reflux disease)   Depression with anxiety   Fall   Closed left hip fracture: secondary to a fall. S/p cannulated screw fixation of valgus impacted left femoral neck fracture as per ortho surg on 06/21/19. Morphine & percocet prn for pain. Zofran prn for nausea/vomiting. Ortho surg following and recs apprec. PT recs SNF  Fever: 101.9. Etiology unclear. Tylenol prn. If it occurs again, will obtain blood cxs. No d/c to SNF today. Will need to be afebrile for 24 hours  Acute metabolic encephalopathy: etiology unclear, possibly delirium vs mild cognitive impairment vs infection.   Normocytic anemia: possibly some post-op anemia. No need for a transfusion at this time. Will continue to monitor   Acquired hypothyroidism: continue on levothyroxine   CAD: No chest pain. Continue on metoprolol  Acute respiratory failure with hypoxia: etiology unclear. CXR negative. Continue w/ bronchodilators. Mucinex prn for cough. Weaned off of supplemental oxygen   GERD: continue on protonix  Depression: with anxiety. No SI or HI. Severity unknown. Continue on home dose of escitalopram, ativan       DVT prophylaxis: lovenox Code Status: DNR Family Communication: called pt's daughter, Cathlean Cower, but no answer so I left her a message Disposition Plan: will d/c SNF but cannot go today as pt spiked a fever, will need to afebrile for 24 hours    Consultants:   Ortho surg   Procedures:    Antimicrobials:    Subjective: Pt c/o left hip pain   Objective: Vitals:   06/22/19 1637 06/22/19 2122 06/23/19 0110 06/23/19 0801  BP: (!) 135/48 (!) 171/149  103/83 (!) 129/49  Pulse: 70 96 89 74  Resp: 18  16   Temp: 98 F (36.7 C)  98.1 F (36.7 C) (!) 101.9 F (38.8 C)  TempSrc:   Oral   SpO2: 97%  95% 91%  Weight:      Height:        Intake/Output Summary (Last 24 hours) at 06/23/2019 0805 Last data filed at 06/22/2019 1913 Gross per 24 hour  Intake 907.12 ml  Output --  Net 907.12 ml   Filed Weights   06/21/19 0915  Weight: 63.5 kg    Examination:  General exam: Appears calm and comfortable  Respiratory system: diminished breath sounds b/l. No rales Cardiovascular system: S1 & S2 +. No  rubs, gallops or clicks.  Gastrointestinal system: Abdomen is nondistended, soft and nontender.  Hypoactive bowel sounds heard. Central nervous system: Alert and oriented to self. Psychiatry: Judgement and insight appear abnormal. Flat mood and affect    Data Reviewed: I have personally reviewed following labs and imaging studies  CBC: Recent Labs  Lab 06/21/19 0928 06/22/19 0536 06/23/19 0425  WBC 4.9 7.4 8.4  NEUTROABS 3.0  --   --   HGB 11.2* 9.1* 9.2*  HCT 35.9* 29.1* 28.2*  MCV 84.1 83.9 82.5  PLT 291 198 203   Basic Metabolic Panel: Recent Labs  Lab 06/21/19 0928 06/22/19 0536 06/23/19 0425  NA 136 132* 135  K 4.1 3.9 3.9  CL 98 101 102  CO2 30 26 28   GLUCOSE 93 109* 103*  BUN 15  14 11  CREATININE 0.98 0.75 0.67  CALCIUM 8.8* 7.7* 7.9*   GFR: Estimated Creatinine Clearance: 45.5 mL/min (by C-G formula based on SCr of 0.67 mg/dL). Liver Function Tests: No results for input(s): AST, ALT, ALKPHOS, BILITOT, PROT, ALBUMIN in the last 168 hours. No results for input(s): LIPASE, AMYLASE in the last 168 hours. No results for input(s): AMMONIA in the last 168 hours. Coagulation Profile: No results for input(s): INR, PROTIME in the last 168 hours. Cardiac Enzymes: No results for input(s): CKTOTAL, CKMB, CKMBINDEX, TROPONINI in the last 168 hours. BNP (last 3 results) No results for input(s): PROBNP in the last  8760 hours. HbA1C: No results for input(s): HGBA1C in the last 72 hours. CBG: No results for input(s): GLUCAP in the last 168 hours. Lipid Profile: No results for input(s): CHOL, HDL, LDLCALC, TRIG, CHOLHDL, LDLDIRECT in the last 72 hours. Thyroid Function Tests: No results for input(s): TSH, T4TOTAL, FREET4, T3FREE, THYROIDAB in the last 72 hours. Anemia Panel: No results for input(s): VITAMINB12, FOLATE, FERRITIN, TIBC, IRON, RETICCTPCT in the last 72 hours. Sepsis Labs: No results for input(s): PROCALCITON, LATICACIDVEN in the last 168 hours.  Recent Results (from the past 240 hour(s))  Respiratory Panel by RT PCR (Flu A&B, Covid) - Nasopharyngeal Swab     Status: None   Collection Time: 06/21/19  1:16 PM   Specimen: Nasopharyngeal Swab  Result Value Ref Range Status   SARS Coronavirus 2 by RT PCR NEGATIVE NEGATIVE Final    Comment: (NOTE) SARS-CoV-2 target nucleic acids are NOT DETECTED. The SARS-CoV-2 RNA is generally detectable in upper respiratoy specimens during the acute phase of infection. The lowest concentration of SARS-CoV-2 viral copies this assay can detect is 131 copies/mL. A negative result does not preclude SARS-Cov-2 infection and should not be used as the sole basis for treatment or other patient management decisions. A negative result may occur with  improper specimen collection/handling, submission of specimen other than nasopharyngeal swab, presence of viral mutation(s) within the areas targeted by this assay, and inadequate number of viral copies (<131 copies/mL). A negative result must be combined with clinical observations, patient history, and epidemiological information. The expected result is Negative. Fact Sheet for Patients:  https://www.moore.com/ Fact Sheet for Healthcare Providers:  https://www.young.biz/ This test is not yet ap proved or cleared by the [place] FDA and  has been authorized for  detection and/or diagnosis of SARS-CoV-2 by FDA under an Emergency Use Authorization (EUA). This EUA will remain  in effect (meaning this test can be used) for the duration of the COVID-19 declaration under Section 564(b)(1) of the Act, 21 U.S.C. section 360bbb-3(b)(1), unless the authorization is terminated or revoked sooner.    Influenza A by PCR NEGATIVE NEGATIVE Final   Influenza B by PCR NEGATIVE NEGATIVE Final    Comment: (NOTE) The Xpert Xpress SARS-CoV-2/FLU/RSV assay is intended as an aid in  the diagnosis of influenza from Nasopharyngeal swab specimens and  should not be used as a sole basis for treatment. Nasal washings and  aspirates are unacceptable for Xpert Xpress SARS-CoV-2/FLU/RSV  testing. Fact Sheet for Patients: https://www.moore.com/ Fact Sheet for Healthcare Providers: https://www.young.biz/ This test is not yet approved or cleared by the [place] FDA and  has been authorized for detection and/or diagnosis of SARS-CoV-2 by  FDA under an Emergency Use Authorization (EUA). This EUA will remain  in effect (meaning this test can be used) for the duration of the  Covid-19 declaration under Section 564(b)(1) of the Act, 21  U.S.C. section 360bbb-3(b)(1), unless the authorization is  terminated or revoked. Performed at [facility], [place], [place] [address]   SARS CORONAVIRUS 2 (TAT 6-24 HRS) Nasopharyngeal Nasopharyngeal Swab     Status: None   Collection Time: 06/22/19  4:07 PM   Specimen: Nasopharyngeal Swab  Result Value Ref Range Status   SARS Coronavirus 2 NEGATIVE NEGATIVE Final    Comment: (NOTE) SARS-CoV-2 target nucleic acids are NOT DETECTED. The SARS-CoV-2 RNA is generally detectable in upper and lower respiratory specimens during the acute phase of infection. Negative results do not preclude SARS-CoV-2 infection, do not rule out co-infections with other pathogens, and should not  be used as the sole basis for treatment or other patient management decisions. Negative results must be combined with clinical observations, patient history, and epidemiological information. The expected result is Negative. Fact Sheet for Patients: SugarRoll.be Fact Sheet for Healthcare Providers: https://www.woods-mathews.com/ This test is not yet approved or cleared by the [place] FDA and  has been authorized for detection and/or diagnosis of SARS-CoV-2 by FDA under an Emergency Use Authorization (EUA). This EUA will remain  in effect (meaning this test can be used) for the duration of the COVID-19 declaration under Section 56 4(b)(1) of the Act, 21 U.S.C. section 360bbb-3(b)(1), unless the authorization is terminated or revoked sooner. Performed at [facility], [place] [address], [place], [place] [address]          Radiology Studies: DG Chest 1 View  Result Date: 06/21/2019 CLINICAL DATA:  Pt presents to the ED after a mechanical fall this AM. Pt slipped on spilled coffee. Pt denies LOC and head injury. On assessment, no deformity noted the L hip but pt is in a noticeable amount of pain. Hx - right hip arthroplasty 2015, HTN, former smoker. EXAM: CHEST  1 VIEW COMPARISON:  03/26/2019 FINDINGS: Cardiac silhouette normal in size. Large hiatal hernia. No mediastinal or hilar masses. Lungs are hyperexpanded. Mild atelectasis adjacent to the hiatal hernia at the right lung base. Lungs otherwise clear. No pleural effusion or pneumothorax. Skeletal structures are grossly intact. IMPRESSION: No acute cardiopulmonary disease. Electronically Signed   By: Lajean Manes M.D.   On: 06/21/2019 11:34   CT HEAD WO CONTRAST  Result Date: 06/21/2019 CLINICAL DATA:  Pt via EMS from [facility]. Pt has a mechanical fall when she slipped on some coffee. Pt c/o L hip pain. Denies head injury and LOC. Pt on arrival is AANDOx4  Pre op for hip surgery. EXAM: CT HEAD WITHOUT CONTRAST TECHNIQUE: Contiguous axial images were obtained from the base of the skull through the vertex without intravenous contrast. COMPARISON:  03/26/2019 FINDINGS: Brain: No evidence of acute infarction, hemorrhage, hydrocephalus, extra-axial collection or mass lesion/mass effect. There is mild ventricular sulcal enlargement reflecting age-appropriate volume loss. Patchy areas of white matter hypoattenuation are noted bilaterally consistent with moderate chronic microvascular ischemic change. Vascular: No hyperdense vessel or unexpected calcification. Skull: Normal. Negative for fracture or focal lesion. Sinuses/Orbits: Globes and orbits are unremarkable. Minor left maxillary mucosal thickening. Sinuses otherwise clear. Other: None. IMPRESSION: 1. No acute intracranial abnormalities. 2. Age-appropriate volume loss and moderate chronic microvascular ischemic change. Electronically Signed   By: Lajean Manes M.D.   On: 06/21/2019 15:00   DG HIP OPERATIVE UNILAT W OR W/O PELVIS LEFT  Result Date: 06/21/2019 CLINICAL DATA:  ORIF left femoral neck fracture EXAM: OPERATIVE LEFT HIP WITH PELVIS COMPARISON:  06/21/2019 left hip radiographs FLUOROSCOPY TIME:  Fluoroscopy Time:  0 minutes 24 seconds Number of Acquired Spot Images: 3 FINDINGS: Spot fluoroscopic nondiagnostic intraoperative left hip radiographs demonstrate transfixation of left femoral neck fracture with 3 pins in near-anatomic alignment. IMPRESSION: Intraoperative fluoroscopic guidance for ORIF left femoral neck fracture. Electronically Signed   By: Delbert Phenix M.D.   On: 06/21/2019 16:32   DG HIP UNILAT WITH PELVIS 2-3 VIEWS LEFT  Result Date: 06/21/2019 CLINICAL DATA:  Pt presents to the ED after a mechanical fall this AM. Pt slipped on spilled coffee. Pt denies LOC and head injury. On assessment, no deformity noted the L hip but pt is in a noticeable amount of pain. Hx - right hip arthroplasty 2015,  HTN, former smoker. EXAM: DG HIP (WITH OR WITHOUT PELVIS) 2-3V LEFT COMPARISON:  12/15/2018 FINDINGS: Subcapital fracture of the left femoral neck, nondisplaced, non comminuted, but with valgus angulation. No other fractures. No bone lesions. Skeletal structures are demineralized. Right total hip arthroplasty is well-seated and aligned and unchanged. Left hip joint, SI joints and symphysis pubis are normally aligned. Soft tissues are unremarkable. IMPRESSION: 1. Nondisplaced, non comminuted subcapital fracture of the left femoral neck with valgus angulation. Electronically Signed   By: Amie Portland M.D.   On: 06/21/2019 11:35        Scheduled Meds: . dextromethorphan-guaiFENesin  1 tablet Oral BID  . docusate sodium  100 mg Oral BID  . donepezil  10 mg Oral QHS  . enoxaparin (LOVENOX) injection  40 mg Subcutaneous Q24H  . escitalopram  5 mg Oral Daily  . feeding supplement  1 Container Oral TID BM  . haloperidol lactate  2 mg Intramuscular Once  . levothyroxine  100 mcg Oral Q0600  . metoprolol succinate  25 mg Oral QHS  . multivitamin with minerals  1 tablet Oral Daily  . pantoprazole  40 mg Oral Daily  . QUEtiapine  50 mg Oral QHS  . sucralfate  1 g Oral QID  . traZODone  100 mg Oral QHS   Continuous Infusions: . sodium chloride 75 mL/hr at 06/22/19 1146     LOS: 2 days    Time spent: 30 mins    Charise Killian, MD [facility] Pager [phone]  If 7PM-7AM, please contact night-coverage [email or web address] 06/23/2019, 8:05 AM

## 2019-06-23 NOTE — TOC Progression Note (Signed)
Transition of Care [facility]) - Progression Note    Patient Details  Name: Kerry Cabrera MRN: [id number] Date of Birth: May 23, 1928  Transition of Care [facility]) CM/SW Contact  Barrie Dunker, RN Phone Number: 06/23/2019, 12:36 PM  Clinical Narrative:    Daughter caslled and stated that they do not want to go to [facility], [facility] to [facility] to [facility], I called Tresa Endo with [facility] to notify her I called Ricky with [facility] and notified him and let him know the patient chose the bed   Expected Discharge Plan: Skilled Nursing Facility Barriers to Discharge: Continued Medical Work up  Expected Discharge Plan and Services Expected Discharge Plan: Skilled Nursing Facility       Living arrangements for the past 2 months: Assisted Living Facility                                       Social Determinants of Health (SDOH) Interventions    Readmission Risk Interventions No flowsheet data found.

## 2019-06-23 NOTE — Progress Notes (Signed)
Physical Therapy Treatment Patient Details Name: Kerry Cabrera MRN: [id number] DOB: 09-21-1928 Today's Date: 06/23/2019    History of Present Illness Per MD notes: Pt is a 86 y/o F with valgus-impacted left femoral neck fracture following a fall who is s/p in situ cannulated screw fixation. PMH includes HTN, HLD, anxiety, depression, GERD, CAD, TIA, and PUD.    PT Comments    Pt presented as confused, agitated, and stated that she was "paranoid". Pt had continued confusion related to where she was and what happened. From a physical standpoint, pt did not need physical assistance for bed mobility, transfers, or with very short ambulation distances and patient was able to take some small steps from the EOB to the recliner. With that being said, pt requires a lot of extra time and effort with all mobility and doesn't always move in a way that would be safe without supervision (particualrly sit to stand transfers and amb). Pt required max cueing for gait sequencing to maintain PWB status and only had partial success following instruction. Sometimes pt would move the wrong leg and need corrected and other times, pt would begin moving too quickly and would have to be told to stop to properly follow sequencing instructions. Pt remains at a high falls risk and is unlikely to be able to follow WB precautions in her prior living situation. Pt will benefit from PT services in a SNF setting upon discharge to safely address deficits listed in patient problem list for decreased caregiver assistance and eventual return to PLOF.    Follow Up Recommendations  SNF     Equipment Recommendations  Rolling walker with 5" wheels;3in1 (PT)    Recommendations for Other Services       Precautions / Restrictions Precautions Precautions: Fall Restrictions Weight Bearing Restrictions: Yes LLE Weight Bearing: Partial weight bearing    Mobility  Bed Mobility Overal bed mobility: Needs Assistance Bed Mobility:  Supine to Sit;Sit to Supine     Supine to sit: Modified independent (Device/Increase time)        Transfers Overall transfer level: Needs assistance Equipment used: Rolling walker (2 wheeled) Transfers: Sit to/from Stand Sit to Stand: Min guard         General transfer comment: Pt did not require physical assistance but had inc effort, time, and difficulty. Additionally, pt required cueing to stand fully upright.  Ambulation/Gait Ambulation/Gait assistance: Min guard Gait Distance (Feet): 4 Feet Assistive device: Rolling walker (2 wheeled) Gait Pattern/deviations: Step-to pattern;Decreased step length - right;Decreased step length - left;Decreased weight shift to left;Antalgic Gait velocity: decreased   General Gait Details: Pt ambulated from EOB to the recliner. Pt required maximial cueing with every LE and RW movement to follow PWB precautions. Even with maximal cueing, pt was only partially compliant with PWB status secondary to difficulty following commands and being slightly impulsive   Stairs             Wheelchair Mobility    Modified Rankin (Stroke Patients Only)       Balance Overall balance assessment: Needs assistance Sitting-balance support: No upper extremity supported;Feet unsupported Sitting balance-Leahy Scale: Good Sitting balance - Comments: Able to maintain upright seated posture without any UE support   Standing balance support: Bilateral upper extremity supported;During functional activity Standing balance-Leahy Scale: Fair Standing balance comment: mod BUE from RW in standing, max BUE assist from RW during gait  Cognition Arousal/Alertness: Awake/alert Behavior During Therapy: Agitated Overall Cognitive Status: No family/caregiver present to determine baseline cognitive functioning                                 General Comments: Per nurse tech, pt called 911 several minutes before  author entered the room. When author arrived, pt stated she was "paranoid". A few minutes later, a police officer showed up and asked why she called 911 and she was flabbergasted that he thought that she called 911.      Exercises Total Joint Exercises Ankle Circles/Pumps: AROM;Both;10 reps;Strengthening Straight Leg Raises: AROM;Strengthening;Both;5 reps Long Arc Quad: AROM;Strengthening;Both;10 reps Other Exercises Other Exercises: PWB sequencing education Other Exercises: Education on importance of oxygen use    General Comments        Pertinent Vitals/Pain      Home Living                      Prior Function            PT Goals (current goals can now be found in the care plan section) Progress towards PT goals: Progressing toward goals    Frequency    BID      PT Plan Current plan remains appropriate    Co-evaluation              AM-PAC PT "6 Clicks" Mobility   Outcome Measure  Help needed turning from your back to your side while in a flat bed without using bedrails?: A Little Help needed moving from lying on your back to sitting on the side of a flat bed without using bedrails?: A Little Help needed moving to and from a bed to a chair (including a wheelchair)?: A Lot Help needed standing up from a chair using your arms (e.g., wheelchair or bedside chair)?: A Little Help needed to walk in hospital room?: A Lot Help needed climbing 3-5 steps with a railing? : Total 6 Click Score: 14    End of Session Equipment Utilized During Treatment: Gait belt;Oxygen(Pt found with oxygen off, nsg notified) Activity Tolerance: Patient tolerated treatment well Patient left: with call bell/phone within reach;with SCD's reapplied;in chair;with chair alarm set Nurse Communication: Mobility status;Weight bearing status(Nsg notified that pt found on RA with a SpO2 of 82%, O2 reapplied. Nsg notified that pt left in recliner) PT Visit Diagnosis: Unsteadiness on  feet (R26.81);Muscle weakness (generalized) (M62.81);Other abnormalities of gait and mobility (R26.89);History of falling (Z91.81);Pain Pain - Right/Left: Left Pain - part of body: Hip     Time: 6160-7371 PT Time Calculation (min) (ACUTE ONLY): 31 min  Charges:                        Annabelle Harman, SPT 06/23/19 4:45 PM

## 2019-06-23 NOTE — Progress Notes (Signed)
Subjective: 2 Days Post-Op Procedure(s) (LRB): CANNULATED HIP PINNING (Left) Patient reports pain as mild.   Patient is well, and has had no acute complaints or problems Current plan is for d/c to SNF when able. Negative for chest pain and shortness of breath Fever: 101.9 last night. Gastrointestinal:Negative for nausea and vomiting  Objective: Vital signs in last 24 hours: Temp:  [98 F (36.7 C)-101.9 F (38.8 C)] 98.3 F (36.8 C) (03/16 1000) Pulse Rate:  [70-96] 78 (03/16 1000) Resp:  [16-18] 16 (03/16 0110) BP: (103-171)/(48-149) 122/55 (03/16 1000) SpO2:  [91 %-97 %] 94 % (03/16 1000)  Intake/Output from previous day:  Intake/Output Summary (Last 24 hours) at 06/23/2019 1153 Last data filed at 06/22/2019 1913 Gross per 24 hour  Intake 907.12 ml  Output --  Net 907.12 ml    Intake/Output this shift: No intake/output data recorded.  Labs: Recent Labs    06/21/19 0928 06/22/19 0536 06/23/19 0425  HGB 11.2* 9.1* 9.2*   Recent Labs    06/22/19 0536 06/23/19 0425  WBC 7.4 8.4  RBC 3.47* 3.42*  HCT 29.1* 28.2*  PLT 198 203   Recent Labs    06/22/19 0536 06/23/19 0425  NA 132* 135  K 3.9 3.9  CL 101 102  CO2 26 28  BUN 14 11  CREATININE 0.75 0.67  GLUCOSE 109* 103*  CALCIUM 7.7* 7.9*   No results for input(s): LABPT, INR in the last 72 hours.  EXAM General - Patient is Alert and Appropriate Extremity - ABD soft Sensation intact distally Intact pulses distally Dorsiflexion/Plantar flexion intact Incision: dressing C/D/I No cellulitis present Dressing/Incision - clean, dry, no drainage Motor Function - intact, moving foot and toes well on exam.    Past Medical History:  Diagnosis Date  . Arthritis   . Carotid artery occlusion   . Complication of anesthesia    very confused for days  . Coronary artery disease   . Depression   . Glaucoma   . Hyperlipemia   . Hypertension    h/o not on medication  . Hypothyroidism   . Insomnia   . PUD  (peptic ulcer disease)    in distant past  . TIA (transient ischemic attack)     Assessment/Plan: 2 Days Post-Op Procedure(s) (LRB): CANNULATED HIP PINNING (Left) Principal Problem:   Closed left hip fracture (HCC) Active Problems:   Acquired hypothyroidism   CAD (coronary artery disease)   Acute respiratory failure with hypoxia (HCC)   GERD (gastroesophageal reflux disease)   Depression with anxiety   Fall  Estimated body mass index is 22.6 kg/m as calculated from the following:   Height as of this encounter: 5\' 6"  (1.676 m).   Weight as of this encounter: 63.5 kg. Advance diet Up with therapy  Labs reviewed this AM.  Fever of 101.9 last night.  Denies any chest pain, SOB, urinary symptoms. Encouraged incentive spirometer. Hg 9.2 this AM. Begin working with PT today. Begin working on BM.  Upon discharge from hospital continue Lovenox 40mg  daily for 14 days. Follow-up with [facility] in 14 days for staple removal.  DVT Prophylaxis - Lovenox and Foot Pumps Partial weightbearing to the left leg.  , PA-C [facility] 06/23/2019, 11:53 AM

## 2019-06-23 NOTE — Plan of Care (Signed)
  Problem: Clinical Measurements: Goal: Ability to maintain clinical measurements within normal limits will improve Outcome: Progressing   Problem: Pain Managment: Goal: General experience of comfort will improve Outcome: Progressing   Problem: Safety: Goal: Ability to remain free from injury will improve Outcome: Progressing   Problem: Skin Integrity: Goal: Risk for impaired skin integrity will decrease Outcome: Progressing   Problem: Activity: Goal: Ability to ambulate and perform ADLs will improve Outcome: Progressing

## 2019-06-23 NOTE — Discharge Instructions (Signed)
Diet: As you were doing prior to hospitalization   Shower:  May shower but keep the wounds dry, use an occlusive plastic wrap, NO SOAKING IN TUB.  If the bandage gets wet, change with a clean dry gauze.  Dressing:  You may change your dressing as needed. Change the dressing with sterile gauze dressing.    Activity:  Increase activity slowly as tolerated, but follow the weight bearing instructions below.  No lifting or driving for 6 weeks.  Weight Bearing:   Partial weightbearing to the left lower extremity  To prevent constipation: you may use a stool softener such as -  Colace (over the counter) 100 mg by mouth twice a day  Drink plenty of fluids (prune juice may be helpful) and high fiber foods Miralax (over the counter) for constipation as needed.    Itching:  If you experience itching with your medications, try taking only a single pain pill, or even half a pain pill at a time.  You may take up to 10 pain pills per day, and you can also use benadryl over the counter for itching or also to help with sleep.   Precautions:  If you experience chest pain or shortness of breath - call 911 immediately for transfer to the hospital emergency department!!  If you develop a fever greater that 101 F, purulent drainage from wound, increased redness or drainage from wound, or calf pain-Call [facility]                                              Follow- Up Appointment:  Please call for an appointment to be seen in 2 weeks at [facility]

## 2019-06-23 NOTE — Progress Notes (Signed)
Physical Therapy Treatment Patient Details Name: Kerry Cabrera MRN: [id number] DOB: Jun 05, 1928 Today's Date: 06/23/2019    History of Present Illness Per MD notes: Pt is a 86 y/o F with valgus-impacted left femoral neck fracture following a fall who is s/p in situ cannulated screw fixation. PMH includes HTN, HLD, anxiety, depression, GERD, CAD, TIA, and PUD.    PT Comments    Pt was agreeable to PT this morning and appeared to follow one-step commands better today than previous sessions. Pt did not require physical assistance and showed good motor planning and sequencing with all bed mobility and transfers.  Pt was able to take a few small steps at EOB but struggled to consistently follow PWB precautions. It is unclear what role pt's baseline cognition played in her confusion and difficulty with precautions. While pt made good physical progress this morning, she remains at a high risk for falls and non-compliance for WB status. Pt will benefit from PT services in a SNF setting upon discharge to safely address deficits listed in patient problem list for decreased caregiver assistance and eventual return to PLOF.    Follow Up Recommendations  SNF     Equipment Recommendations  Rolling walker with 5" wheels;3in1 (PT)    Recommendations for Other Services       Precautions / Restrictions Precautions Precautions: Fall Restrictions Weight Bearing Restrictions: Yes LLE Weight Bearing: Partial weight bearing    Mobility  Bed Mobility Overal bed mobility: Needs Assistance Bed Mobility: Supine to Sit;Sit to Supine     Supine to sit: Modified independent (Device/Increase time)(Inc time and effort but did not need physical assistance or cueing) Sit to supine: Supervision   General bed mobility comments: Minimal cueing to help with bed positioning  Transfers Overall transfer level: Needs assistance Equipment used: Rolling walker (2 wheeled) Transfers: Sit to/from Stand Sit to Stand:  Min guard         General transfer comment: Pt did not require physical assistance or cueing but had inc effort, time, and difficulty. Pt initally had her forearms on the RW with a flexed trunk before standing fully upright  Ambulation/Gait Ambulation/Gait assistance: Min guard Gait Distance (Feet): 2 Feet Assistive device: Rolling walker (2 wheeled) Gait Pattern/deviations: Step-to pattern;Decreased step length - right;Decreased step length - left;Decreased weight shift to left Gait velocity: decreased   General Gait Details: Pt took several small lateral steps at EOB. Pt had decreased compliance with PWB precautions and struggled to sequence BUE effort through the RW with L single leg stance   Stairs             Wheelchair Mobility    Modified Rankin (Stroke Patients Only)       Balance Overall balance assessment: Needs assistance Sitting-balance support: No upper extremity supported;Feet unsupported Sitting balance-Leahy Scale: Good Sitting balance - Comments: Able to maintain upright seated posture without any UE support   Standing balance support: Bilateral upper extremity supported;During functional activity Standing balance-Leahy Scale: Fair Standing balance comment: mod BUE from RW                            Cognition Arousal/Alertness: Awake/alert Behavior During Therapy: Restless Overall Cognitive Status: No family/caregiver present to determine baseline cognitive functioning                                 General Comments: Pt expressed  that she did not know where she was but appeared to be less confused and followed one-step commands well this morning      Exercises Total Joint Exercises Ankle Circles/Pumps: AROM;Both;10 reps;Strengthening Quad Sets: Strengthening;Both;10 reps Gluteal Sets: Strengthening;Both;10 reps Heel Slides: AROM;Strengthening;Both;10 reps Hip ABduction/ADduction: Strengthening;10  reps;AROM;Both Straight Leg Raises: AROM;Strengthening;10 reps;Both Long Arc Quad: AROM;Strengthening;Both;10 Theatre manager in Standing: AROM;Strengthening;Left;10 reps Other Exercises Other Exercises: Attempted PWB sequence training - pt showed some understanding but did not demonstarte compliance well enough to do more than several small steps at EOB    General Comments        Pertinent Vitals/Pain Pain Assessment: ("Pretty bad")    Home Living                      Prior Function            PT Goals (current goals can now be found in the care plan section) Progress towards PT goals: Progressing toward goals    Frequency    BID      PT Plan Current plan remains appropriate    Co-evaluation              AM-PAC PT "6 Clicks" Mobility   Outcome Measure  Help needed turning from your back to your side while in a flat bed without using bedrails?: A Little Help needed moving from lying on your back to sitting on the side of a flat bed without using bedrails?: A Little Help needed moving to and from a bed to a chair (including a wheelchair)?: A Lot Help needed standing up from a chair using your arms (e.g., wheelchair or bedside chair)?: A Little Help needed to walk in hospital room?: Total Help needed climbing 3-5 steps with a railing? : Total 6 Click Score: 13    End of Session Equipment Utilized During Treatment: Gait belt;Oxygen Activity Tolerance: Patient limited by pain Patient left: in bed;with call bell/phone within reach;with bed alarm set;with SCD's reapplied Nurse Communication: Mobility status;Weight bearing status(Pt found on RA with SpO2 between 81 and 83%, put back on O2 - nsg notified) PT Visit Diagnosis: Unsteadiness on feet (R26.81);Muscle weakness (generalized) (M62.81);Other abnormalities of gait and mobility (R26.89);History of falling (Z91.81);Pain Pain - Right/Left: Left Pain - part of body: Hip     Time: 7893-8101 PT Time  Calculation (min) (ACUTE ONLY): 38 min  Charges:                        Annabelle Harman, SPT 06/23/19 12:00 PM

## 2019-06-24 LAB — BASIC METABOLIC PANEL
Anion gap: 6 (ref 5–15)
BUN: 10 mg/dL (ref 8–23)
CO2: 27 mmol/L (ref 22–32)
Calcium: 7.9 mg/dL — ABNORMAL LOW (ref 8.9–10.3)
Chloride: 103 mmol/L (ref 98–111)
Creatinine, Ser: 0.65 mg/dL (ref 0.44–1.00)
GFR calc Af Amer: >60 mL/min (ref >60)
GFR calc non Af Amer: >60 mL/min (ref >60)
Glucose, Bld: 117 mg/dL — ABNORMAL HIGH (ref 70–99)
Potassium: 3.6 mmol/L (ref 3.5–5.1)
Sodium: 136 mmol/L (ref 135–145)

## 2019-06-24 LAB — CBC
HCT: 29.2 % — ABNORMAL LOW (ref 36.0–46.0)
Hemoglobin: 9.5 g/dL — ABNORMAL LOW (ref 12.0–15.0)
MCH: 26.5 pg (ref 26.0–34.0)
MCHC: 32.5 g/dL (ref 30.0–36.0)
MCV: 81.3 fL (ref 80.0–100.0)
Platelets: 226 10*3/uL (ref 150–400)
RBC: 3.59 MIL/uL — ABNORMAL LOW (ref 3.87–5.11)
RDW: 14.7 % (ref 11.5–15.5)
WBC: 6.6 10*3/uL (ref 4.0–10.5)
nRBC: 0 % (ref 0.0–0.2)

## 2019-06-24 NOTE — Care Management Important Message (Signed)
Important Message  Patient Details  Name: Kerry Cabrera MRN: [id number] Date of Birth: Nov 08, 1928   Medicare Important Message Given:  Yes Signed by patient however daughter Earley Favor verbalized understanding.      Trenton Founds, RN 06/24/2019, 10:37 AM

## 2019-06-24 NOTE — Plan of Care (Signed)
  Problem: Clinical Measurements: Goal: Ability to maintain clinical measurements within normal limits will improve Outcome: Progressing   Problem: Pain Managment: Goal: General experience of comfort will improve Outcome: Progressing   Problem: Safety: Goal: Ability to remain free from injury will improve Outcome: Progressing   Problem: Skin Integrity: Goal: Risk for impaired skin integrity will decrease Outcome: Progressing   Problem: Education: Goal: Verbalization of understanding the information provided (i.e., activity precautions, restrictions, etc) will improve Outcome: Progressing   Problem: Activity: Goal: Ability to ambulate and perform ADLs will improve Outcome: Progressing

## 2019-06-24 NOTE — TOC Progression Note (Signed)
Transition of Care [facility]) - Progression Note    Patient Details  Name: Kerry Cabrera MRN: [id number] Date of Birth: 1928-06-30  Transition of [facility]) CM/SW Contact  Decorian Schuenemann, Lemar Livings, LCSW Phone Number: 06/24/2019, 9:44 AM  Clinical Narrative:   MD reports pt is medically ready for transfer to [facility] today. Contacted [facility] she will go to room B11. MD to work on paperwork for transfer. COVID test negative.     Expected Discharge Plan: Skilled Nursing Facility Barriers to Discharge: Continued Medical Work up  Expected Discharge Plan and Services Expected Discharge Plan: Skilled Nursing Facility       Living arrangements for the past 2 months: Assisted Living Facility                                       Social Determinants of Health (SDOH) Interventions    Readmission Risk Interventions No flowsheet data found.

## 2019-06-24 NOTE — Discharge Summary (Signed)
[facility] [place] NAME: Kerry Cabrera    MR#:  [id number]  DATE OF BIRTH:  01/27/1929  DATE OF ADMISSION:  06/21/2019   ADMITTING PHYSICIAN: Ivor Costa, MD  DATE OF DISCHARGE: 06/24/2019  PRIMARY CARE PHYSICIAN: Leonel Ramsay, MD   ADMISSION DIAGNOSIS:  Surgery, elective [Z41.9] Fall [W19.XXXA] Closed left hip fracture ([place]) [S72.002A] DISCHARGE DIAGNOSIS:  Principal Problem:   Closed left hip fracture (HCC) Active Problems:   Acquired hypothyroidism   CAD (coronary artery disease)   Acute respiratory failure with hypoxia (HCC)   GERD (gastroesophageal reflux disease)   Depression with anxiety   Fall  SECONDARY DIAGNOSIS:   Past Medical History:  Diagnosis Date  . Arthritis   . Carotid artery occlusion   . Complication of anesthesia    very confused for days  . Coronary artery disease   . Depression   . Glaucoma   . Hyperlipemia   . Hypertension    h/o not on medication  . Hypothyroidism   . Insomnia   . PUD (peptic ulcer disease)    in distant past  . TIA (transient ischemic attack)    HOSPITAL COURSE:  Kerry Cabrera is a 86 y.o. female with medical history significant of hypertension, hyperlipidemia, TIA, GERD, hypothyroidism, depression with anxiety, CAD, carotid artery stenosis, admitted s/p fall and left hip pain  ED Course: pt was found to have WBC 4.9, negative urinalysis, negative COVID-19 PCR, electrolytes renal function okay, temperature normal, blood pressure 162/59, heart rate 58, RR 18, chest x-ray negative.  CT head negative.  X-ray showed left femoral neck fracture.  Patient is admitted to [facility] bed as inpatient.  Orthopedic surgeon, Dr. Roland Rack was consulted.  * Closed left hip fracture: secondary to a fall.S/p cannulated screw fixation of valgus impacted left femoral neck fracture as per ortho surg on 06/21/19. PT recs SNF where she is being D/Ced today as medically stable.  * Fever: 101.9. Etiology  unclear. Could be Viral. Transient and Resolved. She is afebrile for 24 hours  * Acute metabolic encephalopathy:  possibly delirium vs mild cognitive impairment. Avoid Benzo's. She also has underlying depression/anxiety issues.  * Normocytic anemia: possibly some post-op anemia. No need for a transfusion at this time.   * Acquired hypothyroidism: continue levothyroxine   * CAD:No chest pain. Continue on metoprolol  * Acute respiratory failure with hypoxia: present on admission, now resolved. CXR negative. Weaned off of supplemental oxygen  * GERD: continue protonix  * Depression: with anxiety. No SI or HI. Continue on home dose of escitalopram,  Seroquel, trazodone.  Outpt Palliative care DISCHARGE CONDITIONS:  stable CONSULTS OBTAINED:  Treatment Team:  Corky Mull, MD DRUG ALLERGIES:   Allergies  Allergen Reactions  . Ace Inhibitors Cough  . Codeine Nausea And Vomiting and Nausea Only  . Gabapentin Other (See Comments)    unsure  . Micardis Hct [Telmisartan-Hctz]     unsure  . Prednisone Other (See Comments)    Pt has received cortisone injection for knee with out problems. Pt states she will not take pills for extended time due to feeling lethargic and weight gain. Weight gain  . Sulfa Antibiotics Other (See Comments) and Rash    Fever 106 degrees,  sent to ER High fever  . Zithromax [Azithromycin] Other (See Comments) and Rash    unsure   DISCHARGE MEDICATIONS:   Allergies as of 06/24/2019      Reactions   Ace Inhibitors Cough  Codeine Nausea And Vomiting, Nausea Only   Gabapentin Other (See Comments)   unsure   Micardis Hct [telmisartan-hctz]    unsure   Prednisone Other (See Comments)   Pt has received cortisone injection for knee with out problems. Pt states she will not take pills for extended time due to feeling lethargic and weight gain. Weight gain   Sulfa Antibiotics Other (See Comments), Rash   Fever 106 degrees,  sent to ER High fever    Zithromax [azithromycin] Other (See Comments), Rash   unsure      Medication List    STOP taking these medications   LORazepam 1 MG tablet Commonly known as: ATIVAN     TAKE these medications   donepezil 10 MG tablet Commonly known as: ARICEPT Take 10 mg by mouth at bedtime.   enoxaparin 40 MG/0.4ML injection Commonly known as: LOVENOX Inject 0.4 mLs (40 mg total) into the skin daily.   escitalopram 5 MG tablet Commonly known as: LEXAPRO Take 5 mg by mouth daily.   levothyroxine 100 MCG tablet Commonly known as: SYNTHROID Take 100 mcg by mouth every morning.   metoprolol succinate 25 MG 24 hr tablet Commonly known as: TOPROL-XL Take 25 mg by mouth at bedtime.   multivitamin tablet Take 1 tablet by mouth daily.   omeprazole 40 MG capsule Commonly known as: PRILOSEC Take 40 mg by mouth 2 (two) times daily.   oxyCODONE 5 MG immediate release tablet Commonly known as: Oxy IR/ROXICODONE Take 1-2 tablets (5-10 mg total) by mouth every 4 (four) hours as needed for moderate pain (pain score 4-6).   QUEtiapine 50 MG tablet Commonly known as: SEROQUEL Take 50 mg by mouth at bedtime.   sucralfate 1 g tablet Commonly known as: CARAFATE Take 1 g by mouth 4 (four) times daily.   traZODone 100 MG tablet Commonly known as: DESYREL Take 100 mg by mouth at bedtime.      DISCHARGE INSTRUCTIONS:   DIET:  Regular diet DISCHARGE CONDITION:  Stable ACTIVITY:  Activity as tolerated OXYGEN:  Home Oxygen: No.  Oxygen Delivery: room air DISCHARGE LOCATION:  nursing home   If you experience worsening of your admission symptoms, develop shortness of breath, life threatening emergency, suicidal or homicidal thoughts you must seek medical attention immediately by calling 911 or calling your MD immediately  if symptoms less severe.  You Must read complete instructions/literature along with all the possible adverse reactions/side effects for all the Medicines you take and that  have been prescribed to you. Take any new Medicines after you have completely understood and accpet all the possible adverse reactions/side effects.   Please note  You were cared for by a hospitalist during your hospital stay. If you have any questions about your discharge medications or the care you received while you were in the hospital after you are discharged, you can call the unit and asked to speak with the hospitalist on call if the hospitalist that took care of you is not available. Once you are discharged, your primary care physician will handle any further medical issues. Please note that NO REFILLS for any discharge medications will be authorized once you are discharged, as it is imperative that you return to your primary care physician (or establish a relationship with a primary care physician if you do not have one) for your aftercare needs so that they can reassess your need for medications and monitor your lab values.    On the day of Discharge:  VITAL  SIGNS:  Blood pressure (!) 126/59, pulse 72, temperature 99.3 F (37.4 C), temperature source Oral, resp. rate 18, height 5\' 6"  (1.676 m), weight 63.5 kg, SpO2 93 %. PHYSICAL EXAMINATION:  GENERAL:  86 y.o.-year-old patient lying in the bed with no acute distress.  EYES: Pupils equal, round, reactive to light and accommodation. No scleral icterus. Extraocular muscles intact.  HEENT: Head atraumatic, normocephalic. Oropharynx and nasopharynx clear.  NECK:  Supple, no jugular venous distention. No thyroid enlargement, no tenderness.  LUNGS: Normal breath sounds bilaterally, no wheezing, rales,rhonchi or crepitation. No use of accessory muscles of respiration.  CARDIOVASCULAR: S1, S2 normal. No murmurs, rubs, or gallops.  ABDOMEN: Soft, non-tender, non-distended. Bowel sounds present. No organomegaly or mass.  EXTREMITIES: No pedal edema, cyanosis, or clubbing.  NEUROLOGIC: Cranial nerves II through XII are intact. Muscle strength 5/5  in all extremities. Sensation intact. Gait not checked.  PSYCHIATRIC: The patient is alert and oriented x 3.  SKIN: No obvious rash, lesion, or ulcer.  DATA REVIEW:   CBC Recent Labs  Lab 06/24/19 0521  WBC 6.6  HGB 9.5*  HCT 29.2*  PLT 226    Chemistries  Recent Labs  Lab 06/24/19 0521  NA 136  K 3.6  CL 103  CO2 27  GLUCOSE 117*  BUN 10  CREATININE 0.65  CALCIUM 7.9*     Outpatient follow-up  Contact information for follow-up providers    06/26/19, PA-C Follow up in 14 day(s).   Specialty: Physician Assistant Why: [place] information: [address] [place] [phone]        [phone], MD. Schedule an appointment as soon as possible for a visit in 1 week(s).   Specialty: Infectious Diseases Contact information: [address] [place] [phone]            Contact information for after-discharge care    Destination    [facility] .   Service: Skilled Nursing Contact information: [address] [place] [phone]                   Management plans discussed with the patient, family (d/w daughter over phone) and they are in agreement.  CODE STATUS: DNR   TOTAL TIME TAKING CARE OF THIS PATIENT: 45 minutes.    [phone] M.D on 06/24/2019 at 9:59 AM  [facility]   CC: Primary care physician; 06/26/2019, MD   Note: This dictation was prepared with Dragon dictation along with smaller phrase technology. Any transcriptional errors that result from this process are unintentional.

## 2019-06-24 NOTE — Progress Notes (Signed)
New referral for [facility] community Palliative to follow at [facility] received from Dr. Sherryll Burger. Patient information given to referral. Plan is for discharge today. Dayna Barker BSN RN, [facility] Liaison [employer] [phone]

## 2019-06-24 NOTE — TOC Transition Note (Signed)
Transition of Care [facility]) - CM/SW Discharge Note   Patient Details  Name: Kerry Cabrera MRN: [id number] Date of Birth: 05-15-1928  [facility] of [facility]) CM/SW Contact:  Barrie Dunker, RN Phone Number: 06/24/2019, 10:58 AM   Clinical Narrative:    The patient will DC to [facility] room B11 today via EMS transport, I called the daughter and notified her of the DC, The bedside nurse to call report to [facility], the DC packet is on the chart, RNCM called EMS and put the patient on the list for pick up   Final next level of care: Skilled Nursing Facility Barriers to Discharge: Barriers Resolved   Patient Goals and CMS Choice Patient states their goals for this hospitalization and ongoing recovery are:: get better      Discharge Placement              Patient chooses bed at: [facility] Patient to be transferred to facility by: EMS Name of family member notified: Cathlean Cower Patient and family notified of of transfer: 06/24/19  Discharge Plan and Services                                     Social Determinants of Health (SDOH) Interventions     Readmission Risk Interventions No flowsheet data found.

## 2019-06-24 NOTE — Progress Notes (Signed)
Physical Therapy Treatment Patient Details Name: Kerry Cabrera MRN: [id number] DOB: Dec 30, 1928 Today's Date: 06/24/2019    History of Present Illness Per MD notes: Pt is a 86 y/o F with valgus-impacted left femoral neck fracture following a fall who is s/p in situ cannulated screw fixation. PMH includes HTN, HLD, anxiety, depression, GERD, CAD, TIA, and PUD.    PT Comments    Pt initially agitated but soon became pleasant and motivated to participate during the session. Pt found on RA with a SpO2 of 82%, nsg notified. O2 was reapplied and SpO2 quickly returned to the mid90's and stayed there throughout the session. Pt does well physically but requires maximum, step-by-step cueing for RW and LE sequencing to ambulate. Pt was able to ambulate around her hospital room with max cueing and described the effort as moderate.  Pt is unsafe to return to prior living situation due to being at a high fall risk and is very unlikely to be able to follow PWB status independently. Pt will benefit from PT services in a SNF setting upon discharge to safely address deficits listed in patient problem list for decreased caregiver assistance and eventual return to PLOF.    Follow Up Recommendations  SNF     Equipment Recommendations  Rolling walker with 5" wheels;3in1 (PT)    Recommendations for Other Services       Precautions / Restrictions Precautions Precautions: Fall Restrictions Weight Bearing Restrictions: Yes LLE Weight Bearing: Partial weight bearing    Mobility  Bed Mobility Overal bed mobility: Needs assistance       Supine to sit: Modified independent (Device/Increase time) Sit to supine: Supervision   General bed mobility comments: Minimal cueing to help with bed positioning  Transfers Overall transfer level: Needs assistance Equipment used: Rolling walker (2 wheeled) Transfers: Sit to/from Stand Sit to Stand: Min guard         General transfer comment: Pt did not require  physical assistance but had inc effort, time, and difficulty  Ambulation/Gait Ambulation/Gait assistance: Min guard Gait Distance (Feet): 10 Feet Assistive device: Rolling walker (2 wheeled) Gait Pattern/deviations: Step-to pattern;Decreased step length - right;Decreased weight shift to left;Antalgic Gait velocity: decreased   General Gait Details: Pt ambulated with max cueing for sequencing inside her hospital room. Pt followed PWB precautions well with cueing for RW movement and sequencing for LE's.   Stairs             Wheelchair Mobility    Modified Rankin (Stroke Patients Only)       Balance Overall balance assessment: Needs assistance Sitting-balance support: No upper extremity supported;Feet unsupported Sitting balance-Leahy Scale: Good Sitting balance - Comments: Able to maintain upright seated posture without any UE support   Standing balance support: Bilateral upper extremity supported;During functional activity Standing balance-Leahy Scale: Fair Standing balance comment: min BUE from RW in standing, mod-max BUE assist from RW during gait                            Cognition Arousal/Alertness: Awake/alert Behavior During Therapy: WFL for tasks assessed/performed;Agitated Overall Cognitive Status: No family/caregiver present to determine baseline cognitive functioning                                 General Comments: Pt was initally agitated and appeared angry but within a few minutes, became more calm and pleasant  Exercises Total Joint Exercises Ankle Circles/Pumps: AROM;Both;10 reps;Strengthening Towel Squeeze: Strengthening;Both;10 reps Heel Slides: AROM;Strengthening;Both;10 reps Hip ABduction/ADduction: Strengthening;10 reps;AROM;Both Straight Leg Raises: AROM;Strengthening;Both;5 reps Long Arc Quad: AROM;Strengthening;Both;10 reps Marching in Standing: AROM;Strengthening;Left;10 reps Other Exercises Other Exercises:  PWB sequencing education    General Comments        Pertinent Vitals/Pain Pain Assessment: ("worse than yesterday")    Home Living                      Prior Function            PT Goals (current goals can now be found in the care plan section) Progress towards PT goals: Progressing toward goals    Frequency    BID      PT Plan Current plan remains appropriate    Co-evaluation              AM-PAC PT "6 Clicks" Mobility   Outcome Measure  Help needed turning from your back to your side while in a flat bed without using bedrails?: A Little Help needed moving from lying on your back to sitting on the side of a flat bed without using bedrails?: A Little Help needed moving to and from a bed to a chair (including a wheelchair)?: A Little Help needed standing up from a chair using your arms (e.g., wheelchair or bedside chair)?: A Little Help needed to walk in hospital room?: A Lot Help needed climbing 3-5 steps with a railing? : A Lot 6 Click Score: 16    End of Session Equipment Utilized During Treatment: Gait belt;Oxygen(Pt found with oxygen off, nsg notified) Activity Tolerance: Patient tolerated treatment well Patient left: with call bell/phone within reach;with SCD's reapplied;in bed;with bed alarm set;with nursing/sitter in room(NT in room) Nurse Communication: Mobility status;Weight bearing status PT Visit Diagnosis: Unsteadiness on feet (R26.81);Muscle weakness (generalized) (M62.81);Other abnormalities of gait and mobility (R26.89);History of falling (Z91.81);Pain Pain - Right/Left: Left Pain - part of body: Hip     Time: 1027-1109 PT Time Calculation (min) (ACUTE ONLY): 42 min  Charges:                        Annabelle Harman, SPT 06/24/19 11:42 AM

## 2019-06-24 NOTE — Progress Notes (Signed)
Report called to [facility]  [phone]. Report given to Maryruth Hancock RN. All questions answered.  Informed family is to arrive at 2:30pm to sign paperwork and can receive pt after that. SW made aware.

## 2019-06-24 NOTE — Progress Notes (Signed)
Subjective: 3 Days Post-Op Procedure(s) (LRB): CANNULATED HIP PINNING (Left) Patient reports pain as mild.   Patient is well, and has had no acute complaints or problems Current plan is for d/c to SNF when able. Negative for chest pain and shortness of breath Fever: Mild fever last night Gastrointestinal:Negative for nausea and vomiting  Objective: Vital signs in last 24 hours: Temp:  [97.9 F (36.6 C)-99.3 F (37.4 C)] 99.3 F (37.4 C) (03/17 0738) Pulse Rate:  [71-80] 72 (03/17 0738) Resp:  [17-18] 18 (03/17 0738) BP: (109-162)/(43-68) 126/59 (03/17 0738) SpO2:  [92 %-99 %] 93 % (03/17 0738)  Intake/Output from previous day:  Intake/Output Summary (Last 24 hours) at 06/24/2019 1232 Last data filed at 06/24/2019 0658 Gross per 24 hour  Intake --  Output 700 ml  Net -700 ml    Intake/Output this shift: No intake/output data recorded.  Labs: Recent Labs    06/22/19 0536 06/23/19 0425 06/24/19 0521  HGB 9.1* 9.2* 9.5*   Recent Labs    06/23/19 0425 06/24/19 0521  WBC 8.4 6.6  RBC 3.42* 3.59*  HCT 28.2* 29.2*  PLT 203 226   Recent Labs    06/23/19 0425 06/24/19 0521  NA 135 136  K 3.9 3.6  CL 102 103  CO2 28 27  BUN 11 10  CREATININE 0.67 0.65  GLUCOSE 103* 117*  CALCIUM 7.9* 7.9*   No results for input(s): LABPT, INR in the last 72 hours.  EXAM General - Patient is Alert and Appropriate Extremity - ABD soft Sensation intact distally Intact pulses distally Dorsiflexion/Plantar flexion intact Incision: dressing C/D/I No cellulitis present Dressing/Incision - clean, dry, no drainage Motor Function - intact, moving foot and toes well on exam.    Past Medical History:  Diagnosis Date  . Arthritis   . Carotid artery occlusion   . Complication of anesthesia    very confused for days  . Coronary artery disease   . Depression   . Glaucoma   . Hyperlipemia   . Hypertension    h/o not on medication  . Hypothyroidism   . Insomnia   . PUD  (peptic ulcer disease)    in distant past  . TIA (transient ischemic attack)     Assessment/Plan: 3 Days Post-Op Procedure(s) (LRB): CANNULATED HIP PINNING (Left) Principal Problem:   Closed left hip fracture (HCC) Active Problems:   Acquired hypothyroidism   CAD (coronary artery disease)   Acute respiratory failure with hypoxia (HCC)   GERD (gastroesophageal reflux disease)   Depression with anxiety   Fall  Estimated body mass index is 22.6 kg/m as calculated from the following:   Height as of this encounter: 5\' 6"  (1.676 m).   Weight as of this encounter: 63.5 kg. Advance diet Up with therapy  Labs reviewed this AM. WBC 6.6 Denies any chest pain, SOB, urinary symptoms. Encouraged incentive spirometer. Hg 9.5 this AM. Patient has had a BM.  Upon discharge from hospital continue Lovenox 40mg  daily for 14 days. Follow-up with [facility] in 14 days for staple removal.  DVT Prophylaxis - Lovenox and Foot Pumps Partial weightbearing to the left leg.  , PA-C [facility] Orthopaedic Surgery 06/24/2019, 12:32 PM

## 2019-07-16 ENCOUNTER — Emergency Department
Admission: EM | Admit: 2019-07-16 | Discharge: 2019-07-16 | Disposition: A | Discharge status: Home / Self Care | Payer: Medicare Other | Attending: Student | Admitting: Student

## 2019-07-16 ENCOUNTER — Encounter: Payer: Self-pay | Admitting: Emergency Medicine

## 2019-07-16 ENCOUNTER — Other Ambulatory Visit: Payer: Self-pay

## 2019-07-16 DIAGNOSIS — Z7901 Long term (current) use of anticoagulants: Secondary | ICD-10-CM | POA: Diagnosis not present

## 2019-07-16 DIAGNOSIS — Z79899 Other long term (current) drug therapy: Secondary | ICD-10-CM | POA: Diagnosis not present

## 2019-07-16 DIAGNOSIS — I2510 Atherosclerotic heart disease of native coronary artery without angina pectoris: Secondary | ICD-10-CM | POA: Diagnosis not present

## 2019-07-16 DIAGNOSIS — Z96652 Presence of left artificial knee joint: Secondary | ICD-10-CM | POA: Diagnosis not present

## 2019-07-16 DIAGNOSIS — I10 Essential (primary) hypertension: Secondary | ICD-10-CM | POA: Insufficient documentation

## 2019-07-16 DIAGNOSIS — Z87891 Personal history of nicotine dependence: Secondary | ICD-10-CM | POA: Insufficient documentation

## 2019-07-16 DIAGNOSIS — R109 Unspecified abdominal pain: Secondary | ICD-10-CM | POA: Insufficient documentation

## 2019-07-16 DIAGNOSIS — Z0000 Encounter for general adult medical examination without abnormal findings: Secondary | ICD-10-CM

## 2019-07-16 DIAGNOSIS — Z96641 Presence of right artificial hip joint: Secondary | ICD-10-CM | POA: Insufficient documentation

## 2019-07-16 LAB — COMPREHENSIVE METABOLIC PANEL
ALT: 10 U/L (ref 0–44)
AST: 17 U/L (ref 15–41)
Albumin: 3.6 g/dL (ref 3.5–5.0)
Alkaline Phosphatase: 92 U/L (ref 38–126)
Anion gap: 8 (ref 5–15)
BUN: 9 mg/dL (ref 8–23)
CO2: 23 mmol/L (ref 22–32)
Calcium: 8.7 mg/dL — ABNORMAL LOW (ref 8.9–10.3)
Chloride: 101 mmol/L (ref 98–111)
Creatinine, Ser: 0.76 mg/dL (ref 0.44–1.00)
GFR calc Af Amer: >60 mL/min (ref >60)
GFR calc non Af Amer: >60 mL/min (ref >60)
Glucose, Bld: 116 mg/dL — ABNORMAL HIGH (ref 70–99)
Potassium: 3.8 mmol/L (ref 3.5–5.1)
Sodium: 132 mmol/L — ABNORMAL LOW (ref 135–145)
Total Bilirubin: 0.7 mg/dL (ref 0.3–1.2)
Total Protein: 6.7 g/dL (ref 6.5–8.1)

## 2019-07-16 LAB — CBC
HCT: 35.2 % — ABNORMAL LOW (ref 36.0–46.0)
Hemoglobin: 11.2 g/dL — ABNORMAL LOW (ref 12.0–15.0)
MCH: 26 pg (ref 26.0–34.0)
MCHC: 31.8 g/dL (ref 30.0–36.0)
MCV: 81.7 fL (ref 80.0–100.0)
Platelets: 356 10*3/uL (ref 150–400)
RBC: 4.31 MIL/uL (ref 3.87–5.11)
RDW: 14.8 % (ref 11.5–15.5)
WBC: 4.4 10*3/uL (ref 4.0–10.5)
nRBC: 0 % (ref 0.0–0.2)

## 2019-07-16 LAB — LIPASE, BLOOD: Lipase: 34 U/L (ref 11–51)

## 2019-07-16 NOTE — ED Provider Notes (Signed)
[facility] Emergency Department Provider Note  ____________________________________________   First MD Initiated Contact with Patient 07/16/19 1535     (approximate)  I have reviewed the triage vital signs and the nursing notes.  History  Chief Complaint Abdominal Pain    HPI Kerry Cabrera is a 85 y.o. female with hx of memory loss/mild dementia, HLD, hypothyroidsim, depression, anxiety. Patient states it is a mistake that she is here. She states she was supposed to have an appointment with her PCP (Dr. Sampson Goon) but had been instructed to seek care in the ER first. Patient is initially very upset that she was directed here and is upset with Dr. Sampson Goon for recommending an ER visit. She states she hasn't seen him in over 2 years and she was very much looking forward to her clinic visit that was supposed to be for today. Patient has no acute complaints on my evaluation and is asking to leave.   On chart review, she has seen Dr. Sampson Goon as recently as 06/08/19. It seems on chart review she called in the clinic on 4/5 and yesterday, 4/7, stating she didn't feel well. It appears that she cancelled her appointment, which was actually scheduled for yesterday, 4/7, due to feeling unwell. It seems this is why she was recommended to seek care in the ER.   I discussed the situation over the phone with patient's daughter Kerry Cabrera) - she is her power of attorney, but states the patient does still make her own decisions and lives at [facility] in independent living.  I explained the situation to the daughter over the phone.  At this point in time on my exam, the patient has no acute complaints aside from being frustrated.  Basic labs were obtained in triage which are unremarkable.  The patient is asking to be discharged.  The daughter feels comfortable with this.  The daughter states that the patient suffers from a lot of anxiety and "is a hypochondriac" which she  thinks is what surrounded the several phone calls and canceled appointment, in addition to her hx of memory loss/dementia. The daughter states she also didn't want the patient to seek care in the ER in the first place. The patient has a neighbor/family friend at the bedside who is also informed of the situation and will take the patient home.   Past Medical Hx Past Medical History:  Diagnosis Date  . Arthritis   . Carotid artery occlusion   . Complication of anesthesia    very confused for days  . Coronary artery disease   . Depression   . Glaucoma   . Hyperlipemia   . Hypertension    h/o not on medication  . Hypothyroidism   . Insomnia   . PUD (peptic ulcer disease)    in distant past  . TIA (transient ischemic attack)     Problem List Patient Active Problem List   Diagnosis Date Noted  . Closed left hip fracture (HCC) 06/21/2019  . Acute respiratory failure with hypoxia (HCC) 06/21/2019  . GERD (gastroesophageal reflux disease) 06/21/2019  . Depression with anxiety 06/21/2019  . Fall 06/21/2019  . Symptomatic bradycardia 11/05/2017  . Abnormal gait 12/17/2016  . Avascular necrosis of femoral head (HCC) 12/17/2016  . Bursitis of hip 12/17/2016  . CAD (coronary artery disease) 12/17/2016  . Closed fracture of intracapsular section of femur (HCC) 12/17/2016  . Closed transcervical fracture of femur (HCC) 12/17/2016  . Hip pain 12/17/2016  . Inflammation  of sacroiliac joint (HCC) 12/17/2016  . Osteoarthritis of hip 12/17/2016  . Osteoarthritis of knee 12/17/2016  . Post-traumatic osteoarthritis 12/17/2016  . Diarrhea 09/20/2016  . Hiatal hernia 09/12/2016  . Acquired hypothyroidism 07/04/2016  . S/P total knee arthroplasty 08/08/2015  . Moderate mitral insufficiency 12/20/2014  . 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  . Hyperlipidemia, mixed 07/09/2013    Past Surgical Hx Past Surgical History:  Procedure Laterality Date  . CARDIAC CATHETERIZATION    . HIP PINNING,CANNULATED  Left 06/21/2019   Procedure: CANNULATED HIP PINNING;  Surgeon: Christena Flake, MD;  Location: [facility] ORS;  Service: Orthopedics;  Laterality: Left;  . KNEE ARTHROPLASTY Left 08/08/2015   Procedure: COMPUTER ASSISTED TOTAL KNEE ARTHROPLASTY;  Surgeon: Donato Heinz, MD;  Location: [facility] ORS;  Service: Orthopedics;  Laterality: Left;  . KNEE ARTHROSCOPY    . Percutaneous pinning of right femoral neck fracture    . TONSILLECTOMY    . TOTAL HIP ARTHROPLASTY Right 02/06/2014   Dr. Deeann Saint    Medications Prior to Admission medications   Medication Sig Start Date End Date Taking? Authorizing Provider  donepezil (ARICEPT) 10 MG tablet Take 10 mg by mouth at bedtime. 03/31/19   [provider]  enoxaparin (LOVENOX) 40 MG/0.4ML injection Inject 0.4 mLs (40 mg total) into the skin daily. 06/24/19   Anson Oregon, PA-C  escitalopram (LEXAPRO) 5 MG tablet Take 5 mg by mouth daily. 05/07/19   [provider]  levothyroxine (SYNTHROID) 100 MCG tablet Take 100 mcg by mouth every morning. 05/22/19   [provider]  metoprolol succinate (TOPROL-XL) 25 MG 24 hr tablet Take 25 mg by mouth at bedtime. 05/07/19   [provider]  Multiple Vitamin (MULTIVITAMIN) tablet Take 1 tablet by mouth daily.    [provider]  omeprazole (PRILOSEC) 40 MG capsule Take 40 mg by mouth 2 (two) times daily. 03/31/19   [provider]  oxyCODONE (OXY IR/ROXICODONE) 5 MG immediate release tablet Take 1-2 tablets (5-10 mg total) by mouth every 4 (four) hours as needed for moderate pain (pain score 4-6). 06/23/19   Anson Oregon, PA-C  QUEtiapine (SEROQUEL) 50 MG tablet Take 50 mg by mouth at bedtime. 05/04/19   [provider]  sucralfate (CARAFATE) 1 g tablet Take 1 g by mouth 4 (four) times daily. 06/01/19   [provider]  traZODone (DESYREL) 100 MG tablet Take 100 mg by mouth at bedtime. 06/08/19   [provider]    Allergies Ace  inhibitors, Codeine, Gabapentin, Micardis hct [telmisartan-hctz], Prednisone, Sulfa antibiotics, and Zithromax [azithromycin]  Family Hx Family History  Problem Relation Age of Onset  . Prostate cancer Son   . Kidney disease Neg Hx   . Kidney cancer Neg Hx     Social Hx Social History   Tobacco Use  . Smoking status: Former Smoker    Years: 12.00  . Smokeless tobacco: Never Used  Substance Use Topics  . Alcohol use: Yes    Comment: osccasional  . Drug use: No     Review of Systems  Constitutional: Negative for fever. Negative for chills. Eyes: Negative for visual changes. ENT: Negative for sore throat. Cardiovascular: Negative for chest pain. Respiratory: Negative for shortness of breath. Gastrointestinal: Negative for nausea. Negative for vomiting.  Genitourinary: Negative for dysuria. Musculoskeletal: Negative for leg swelling. Skin: Negative for rash. Neurological: Negative for headaches.   Physical Exam  Vital Signs: ED Triage Vitals  Enc Vitals Group     BP  07/16/19 1421 (!) 147/71     Pulse Rate 07/16/19 1420 75     Resp 07/16/19 1420 16     Temp 07/16/19 1420 98.4 F (36.9 C)     Temp Source 07/16/19 1420 Oral     SpO2 07/16/19 1420 96 %     Weight 07/16/19 1421 140 lb (63.5 kg)     Height --      Head Circumference --      Peak Flow --      Pain Score --      Pain Loc --      Pain Edu? --      Excl. in [place]? --     Constitutional: Awake and alert.  Mild dementia.  In no acute distress. Head: Normocephalic. Atraumatic. Eyes: Conjunctivae clear. Sclera anicteric. Pupils equal and symmetric. Nose: No masses or lesions. No congestion or rhinorrhea. Mouth/Throat: Wearing mask.  Neck: No stridor. Trachea midline.  Cardiovascular: Normal rate, regular rhythm. Extremities well perfused. Respiratory: Normal respiratory effort.  Lungs CTAB. Gastrointestinal: Soft. Non-distended. Non-tender.  Genitourinary: Deferred. Musculoskeletal: No lower extremity  edema. No deformities. Neurologic:  Normal speech and language.  Consistent with history of mild dementia.  No gross focal or lateralizing neurologic deficits are appreciated.  Skin: Skin is warm, dry and intact. No rash noted. Psychiatric: Extremely frustrated and argumentative initially.  Able to be redirected and calmed down with further explanation of the situation.  Eventually pleasant and interactive, smiling and laughing with staff.  EKG  N/A    Radiology  N/A  Procedures  Procedure(s) performed (including critical care):  Procedures   Initial Impression / Assessment and Plan / MDM / ED Course  86 y.o. female with hx of memory loss/mild dementia, HLD, hypothyroidsim, depression, anxiety. Patient states it is a mistake that she is here. She states she was supposed to have an appointment with her PCP (Dr. Ola Spurr) but had been instructed to seek care in the ER first. Patient is initially very upset that she was directed here and is upset with Dr. Ola Spurr for recommending an ER visit. She states she hasn't seen him in over 2 years and she was very much looking forward to her clinic visit that was supposed to be for today. Patient has no acute complaints on my evaluation and is asking to leave.   On chart review, she has seen Dr. Ola Spurr as recently as 06/08/19. It seems on chart review she called in the clinic on 4/5 and yesterday, 4/7, stating she didn't feel well. It appears that she cancelled her appointment, which was actually scheduled for yesterday, 4/7, due to feeling unwell. It seems this is why she was recommended to seek care in the ER.   I discussed the situation over the phone with patient's daughter Kerry Cabrera) - she is her power of attorney, but states the patient does still make her own decisions and lives at [facility] in independent living.  I explained the situation to the daughter over the phone.  At this point in time on my exam, the patient has no acute  complaints aside from being frustrated.  Basic labs were obtained in triage which are unremarkable.  The patient is asking to be discharged.  The daughter feels comfortable with this.  The daughter states that the patient suffers from a lot of anxiety and "is a hypochondriac" which she thinks is what surrounded the several phone calls and canceled appointment, in addition to her hx of memory loss/dementia.  The daughter states she also didn't want the patient to seek care in the ER in the first place. The patient has a neighbor/family friend at the bedside who is also informed of the situation and will take the patient home. Given information for the general [facility] if she would like to see a different doctor in the clinic, but otherwise advised follow up with her PCP.  Advised follow up as outpatient. Given return precautions.   _______________________________   As part of my medical decision making I have reviewed available labs, radiology tests, reviewed old records/chart review, obtained additional history from family (daughter via phone).    Final Clinical Impression(s) / ED Diagnosis  Final diagnoses:  Encounter for general medical examination       Note:  This document was prepared using Dragon voice recognition software and may include unintentional dictation errors.   Miguel Aschoff., MD 07/16/19 [phone]

## 2019-07-16 NOTE — ED Notes (Signed)
Dicharge instructions reviewed with patient by Baron Hamper, RN. Pt D/C into the care of her neighbor Inetta Fermo for safe ride back to [facility]. Pt visualized in NAD at this time, appears happy to be being discharged. Verbal consent for discharge obtained at this time due to no e-sig pad available.

## 2019-07-16 NOTE — Discharge Instructions (Addendum)
Thank you for letting us take care of you in the emergency department today.  ° °Please continue to take any regular, prescribed medications.  ° °Please return to the ER for any new or worsening symptoms.  ° °

## 2019-07-16 NOTE — ED Triage Notes (Signed)
Pt brought by EMS for abdominal pain.  From [facility] independent living.  Has mild dementia and pt is poor historian.  Pt reports that she is here "because of my sorry doctor that won't see me. I called for my physical".  Pt oriented to year, place and person.  Pt does appear forgetful.  Admits to "sour feeling" in stomach.

## 2019-08-10 ENCOUNTER — Emergency Department: Payer: Medicare Other

## 2019-08-10 ENCOUNTER — Encounter: Payer: Self-pay | Admitting: Emergency Medicine

## 2019-08-10 ENCOUNTER — Other Ambulatory Visit: Payer: Self-pay

## 2019-08-10 ENCOUNTER — Emergency Department
Admission: EM | Admit: 2019-08-10 | Discharge: 2019-08-10 | Disposition: A | Discharge status: Home / Self Care | Payer: Medicare Other | Attending: Student in an Organized Health Care Education/Training Program | Admitting: Student in an Organized Health Care Education/Training Program

## 2019-08-10 DIAGNOSIS — Z96652 Presence of left artificial knee joint: Secondary | ICD-10-CM | POA: Insufficient documentation

## 2019-08-10 DIAGNOSIS — E039 Hypothyroidism, unspecified: Secondary | ICD-10-CM | POA: Insufficient documentation

## 2019-08-10 DIAGNOSIS — S22080A Wedge compression fracture of T11-T12 vertebra, initial encounter for closed fracture: Secondary | ICD-10-CM | POA: Insufficient documentation

## 2019-08-10 DIAGNOSIS — Z87891 Personal history of nicotine dependence: Secondary | ICD-10-CM | POA: Insufficient documentation

## 2019-08-10 DIAGNOSIS — I10 Essential (primary) hypertension: Secondary | ICD-10-CM | POA: Insufficient documentation

## 2019-08-10 DIAGNOSIS — Z8673 Personal history of transient ischemic attack (TIA), and cerebral infarction without residual deficits: Secondary | ICD-10-CM | POA: Insufficient documentation

## 2019-08-10 DIAGNOSIS — Y9389 Activity, other specified: Secondary | ICD-10-CM | POA: Diagnosis not present

## 2019-08-10 DIAGNOSIS — Z79899 Other long term (current) drug therapy: Secondary | ICD-10-CM | POA: Diagnosis not present

## 2019-08-10 DIAGNOSIS — Y998 Other external cause status: Secondary | ICD-10-CM | POA: Insufficient documentation

## 2019-08-10 DIAGNOSIS — Z96641 Presence of right artificial hip joint: Secondary | ICD-10-CM | POA: Insufficient documentation

## 2019-08-10 DIAGNOSIS — W06XXXA Fall from bed, initial encounter: Secondary | ICD-10-CM | POA: Insufficient documentation

## 2019-08-10 DIAGNOSIS — I2510 Atherosclerotic heart disease of native coronary artery without angina pectoris: Secondary | ICD-10-CM | POA: Insufficient documentation

## 2019-08-10 DIAGNOSIS — Y92122 Bedroom in nursing home as the place of occurrence of the external cause: Secondary | ICD-10-CM | POA: Insufficient documentation

## 2019-08-10 DIAGNOSIS — S299XXA Unspecified injury of thorax, initial encounter: Secondary | ICD-10-CM | POA: Diagnosis present

## 2019-08-10 MED ORDER — TRAMADOL HCL 50 MG PO TABS: 25.0000 mg | ORAL_TABLET | Freq: Four times a day (QID) | ORAL | 0 refills | Status: AC | PRN

## 2019-08-10 MED ORDER — TRAMADOL HCL 50 MG PO TABS
25.0000 mg | ORAL_TABLET | Freq: Once | ORAL | Status: AC
  Administered 2019-08-10: 25 mg via ORAL
  Filled 2019-08-10: qty 1

## 2019-08-10 NOTE — ED Notes (Signed)
Pt to Xray at this time

## 2019-08-10 NOTE — ED Notes (Signed)
See triage note, pt states she was laying in her bed and "they decided I broke my back and put me through hell". Pt reports she lives at [facility] independent living, "they don't take care of me there".  Pt reports constant back pain  Pt alert and oriented.  Ambulatory with assistance.

## 2019-08-10 NOTE — Discharge Instructions (Addendum)
Follow-up with your primary care provider for any continued problems or need for pain medication.  You may apply ice to your back as needed for discomfort.  You can expect to be sore for approximately 4 to 6 weeks.  Be aware that your pain medication could cause drowsiness and increase your risk for falling.

## 2019-08-10 NOTE — ED Notes (Signed)
First Nurse Note: Pt to ED via [facility] from [facility] for fall with back pain. Pt is A & O in NAD.

## 2019-08-10 NOTE — ED Triage Notes (Signed)
Pt to ED via [facility] from [facility] independent living for back pain s/p fall. Pt states that she went to sit on her bed and slid off hitting her back. Pt is in NAD.

## 2019-08-10 NOTE — ED Provider Notes (Signed)
[facility] Emergency Department Provider Note  ____________________________________________   First MD Initiated Contact with Patient 08/10/19 1141     (approximate)  I have reviewed the triage vital signs and the nursing notes.   HISTORY  Chief Complaint Back Pain   HPI Kerry Cabrera is a 86 y.o. female is brought to the ED via EMS with report from [facility] independent living that she was sitting on the side of her bed and slid into the floor.  Patient states that she complained of back pain and was sent away by the staff.  Patient denies any other pain or injuries.  She rates her pain as a 10/10.      Past Medical History:  Diagnosis Date  . Arthritis   . Carotid artery occlusion   . Complication of anesthesia    very confused for days  . Coronary artery disease   . Depression   . Glaucoma   . Hyperlipemia   . Hypertension    h/o not on medication  . Hypothyroidism   . Insomnia   . PUD (peptic ulcer disease)    in distant past  . TIA (transient ischemic attack)     Patient Active Problem List   Diagnosis Date Noted  . Closed left hip fracture (HCC) 06/21/2019  . Acute respiratory failure with hypoxia (HCC) 06/21/2019  . GERD (gastroesophageal reflux disease) 06/21/2019  . Depression with anxiety 06/21/2019  . Fall 06/21/2019  . Symptomatic bradycardia 11/05/2017  . Abnormal gait 12/17/2016  . Avascular necrosis of femoral head (HCC) 12/17/2016  . Bursitis of hip 12/17/2016  . CAD (coronary artery disease) 12/17/2016  . Closed fracture of intracapsular section of femur (HCC) 12/17/2016  . Closed transcervical fracture of femur (HCC) 12/17/2016  . Hip pain 12/17/2016  . Inflammation of sacroiliac joint (HCC) 12/17/2016  . Osteoarthritis of hip 12/17/2016  . Osteoarthritis of knee 12/17/2016  . Post-traumatic osteoarthritis 12/17/2016  . Diarrhea 09/20/2016  . Hiatal hernia 09/12/2016  . Acquired hypothyroidism 07/04/2016    . S/P total knee arthroplasty 08/08/2015  . Moderate mitral insufficiency 12/20/2014  . 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  . Hyperlipidemia, mixed 07/09/2013    Past Surgical History:  Procedure Laterality Date  . CARDIAC CATHETERIZATION    . HIP PINNING,CANNULATED Left 06/21/2019   Procedure: CANNULATED HIP PINNING;  Surgeon: Christena Flake, MD;  Location: [facility] ORS;  Service: Orthopedics;  Laterality: Left;  . KNEE ARTHROPLASTY Left 08/08/2015   Procedure: COMPUTER ASSISTED TOTAL KNEE ARTHROPLASTY;  Surgeon: Donato Heinz, MD;  Location: [facility] ORS;  Service: Orthopedics;  Laterality: Left;  . KNEE ARTHROSCOPY    . Percutaneous pinning of right femoral neck fracture    . TONSILLECTOMY    . TOTAL HIP ARTHROPLASTY Right 02/06/2014   Dr. Deeann Saint    Prior to Admission medications   Medication Sig Start Date End Date Taking? Authorizing Provider  donepezil (ARICEPT) 10 MG tablet Take 10 mg by mouth at bedtime. 03/31/19   [provider]  enoxaparin (LOVENOX) 40 MG/0.4ML injection Inject 0.4 mLs (40 mg total) into the skin daily. 06/24/19   Anson Oregon, PA-C  escitalopram (LEXAPRO) 5 MG tablet Take 5 mg by mouth daily. 05/07/19   [provider]  levothyroxine (SYNTHROID) 100 MCG tablet Take 100 mcg by mouth every morning. 05/22/19   [provider]  metoprolol succinate (TOPROL-XL) 25 MG 24 hr tablet Take 25 mg by mouth at bedtime. 05/07/19   [provider]  Multiple Vitamin (MULTIVITAMIN) tablet Take 1 tablet by mouth daily.    [provider]  omeprazole (PRILOSEC) 40 MG capsule Take 40 mg by mouth 2 (two) times daily. 03/31/19   [provider]  oxyCODONE (OXY IR/ROXICODONE) 5 MG immediate release tablet Take 1-2 tablets (5-10 mg total) by mouth every 4 (four) hours as needed for moderate pain (pain score 4-6). 06/23/19   Lattie Corns, PA-C  QUEtiapine (SEROQUEL) 50 MG tablet Take 50 mg by mouth at bedtime. 05/04/19   [provider]  sucralfate (CARAFATE) 1 g tablet Take 1 g by mouth 4 (four) times daily. 06/01/19   [provider]  traMADol (ULTRAM) 50 MG tablet Take 0.5 tablets (25 mg total) by mouth every 6 (six) hours as needed for up to 5 days for moderate pain. 08/10/19 08/15/19  Johnn Hai, PA-C  traZODone (DESYREL) 100 MG tablet Take 100 mg by mouth at bedtime. 06/08/19   [provider]    Allergies Ace inhibitors, Codeine, Gabapentin, Micardis hct [telmisartan-hctz], Prednisone, Sulfa antibiotics, and Zithromax [azithromycin]  Family History  Problem Relation Age of Onset  . Prostate cancer Son   . Kidney disease Neg Hx   . Kidney cancer Neg Hx     Social History Social History   Tobacco Use  . Smoking status: Former Smoker    Years: 12.00  . Smokeless tobacco: Never Used  Substance Use Topics  . Alcohol use: Yes    Comment: osccasional  . Drug use: No    Review of Systems Constitutional: No fever/chills Eyes: No visual changes. ENT: No trauma. Cardiovascular: Denies chest pain. Respiratory: Denies shortness of breath. Gastrointestinal: No abdominal pain.  No nausea, no vomiting.   Genitourinary: Negative for dysuria. Musculoskeletal: Positive for mid to low back pain. Skin: Negative for rash. Neurological: Negative for headaches, focal weakness or numbness. ____________________________________________   PHYSICAL EXAM:  VITAL SIGNS: ED Triage Vitals  Enc Vitals Group     BP 08/10/19 1053 140/71     Pulse Rate 08/10/19 1053 67     Resp 08/10/19 1053 16     Temp 08/10/19 1053 98.2 F (36.8 C)     Temp Source 08/10/19 1053 Oral     SpO2 08/10/19 1053 97 %     Weight 08/10/19 1052 138 lb 14.2 oz (63 kg)     Height 08/10/19 1052 5\' 6"  (1.676 m)     Head Circumference --      Peak Flow --      Pain Score 08/10/19 1051 10     Pain Loc --      Pain Edu? --      Excl. in [place]? --     Constitutional: Alert and oriented. Well appearing and in no acute  distress.  Patient is talkative and cooperative. Eyes: Conjunctivae are normal.  Head: Atraumatic. Nose: No trauma. Neck: No stridor.  No cervical tenderness on palpation posteriorly. Cardiovascular: Normal rate, regular rhythm. Grossly normal heart sounds.  Good peripheral circulation. Respiratory: Normal respiratory effort.  No retractions. Lungs CTAB. Gastrointestinal: Soft and nontender. No distention. Musculoskeletal: No gross deformities noted however there is moderate tenderness on palpation of the lower thoracic upper lumbar spine area. Neurologic:  Normal speech and language. No gross focal neurologic deficits are appreciated.  Skin:  Skin is warm, dry and intact.  Psychiatric: Mood and affect are normal. Speech and behavior are normal.  ____________________________________________   LABS (all labs ordered are listed, but only abnormal  results are displayed)  Labs Reviewed - No data to display  RADIOLOGY   Official radiology report(s): DG Lumbar Spine 2-3 Views  Result Date: 08/10/2019 CLINICAL DATA:  Back pain. EXAM: LUMBAR SPINE - 2-3 VIEW COMPARISON:  01/25/2014 FINDINGS: Five non-rib-bearing lumbar vertebrae. Interval 40% compression deformity of the T11 vertebral body with no visible fracture lines or significant bony retropulsion. Mild multilevel degenerative changes. These include facet degenerative changes with associated grade 1 anterolisthesis at the L4-5 level. No pars defects are seen. Extensive atheromatous arterial calcifications without aneurysm. Right hip prosthesis and left hip fixation hardware. IMPRESSION: 1. Interval 40% compression deformity of the T11 vertebral body, age indeterminate. 2. Mild multilevel degenerative changes. 3. Extensive atheromatous arterial calcifications. Electronically Signed   By: Beckie Salts M.D.   On: 08/10/2019 13:00   CT Thoracic Spine Wo Contrast  Result Date: 08/10/2019 CLINICAL DATA:  Fall today. Back pain. Indeterminate age  T11 fracture on radiographs. EXAM: CT THORACIC SPINE WITHOUT CONTRAST TECHNIQUE: Multidetector CT images of the thoracic were obtained using the standard protocol without intravenous contrast. COMPARISON:  Lumbar spine radiographs today, chest radiographs 06/21/2019 and 12/06/2018, and abdominal CT 09/19/2018 FINDINGS: Alignment: Normal. Vertebrae: Biconcave fracture at T11 results in 60% loss of vertebral body height. This fracture appears acute with associated paraspinal hemorrhage and 2 mm of osseous retropulsion. The posterior elements are intact. There is a chronic anterior compression deformity at T4 which appears unchanged from chest radiographs 12/06/2018. Paraspinal and other soft tissues: There is a small amount of paraspinal hemorrhage anteriorly at T11, supporting an acute nature for this fracture. There is a large chronic hiatal hernia inferomedially in the right lower hemithorax, incompletely visualized. Aortic atherosclerosis noted. Disc levels: Mild multilevel spondylosis. No large disc herniation or high-grade spinal stenosis identified. IMPRESSION: 1. Biconcave fracture at T11 results in 60% loss of vertebral body height and mild osseous retropulsion. This fracture appears acute with associated paraspinal hemorrhage. 2. Chronic anterior compression deformity at T4. 3. Large chronic hiatal hernia, incompletely visualized. 4. Aortic Atherosclerosis (ICD10-I70.0). Electronically Signed   By: Carey Bullocks M.D.   On: 08/10/2019 13:41    ____________________________________________   PROCEDURES  Procedure(s) performed (including Critical Care):  Procedures   ____________________________________________   INITIAL IMPRESSION / ASSESSMENT AND PLAN / ED COURSE  As part of my medical decision making, I reviewed the following data within the electronic MEDICAL RECORD NUMBER Notes from prior ED visits and [place] Controlled Substance Database  86 year old female is brought to the ED via EMS from  [facility] independent living after reportedly she was sitting on the bed and slid off hitting the floor.  There was no head injury or loss of consciousness.  Patient has complained of back pain.  She denies any other injuries.  On physical exam patient was tender on palpation lower thoracic and upper lumbar area.  Patient was given tramadol 25 mg for pain as her previous records indicate that she has taken this medication without any difficulty.  X-rays show a T11 compression fracture with with age undetermined.  CT scan shows T11 compression fracture at 60% which appears recent.  Patient's pain was under control and patient was noted to be ambulatory in the room without any assistance.  Patient will follow up with her PCP for pain management.  A prescription for tramadol was sent to her pharmacy to continue taking for pain.  Arrangements were made for EMS to return patient to [facility]. ____________________________________________   FINAL CLINICAL IMPRESSION(S) /  ED DIAGNOSES  Final diagnoses:  Compression fracture of T11 vertebra, initial encounter [facility])     ED Discharge Orders         Ordered    traMADol (ULTRAM) 50 MG tablet  Every 6 hours PRN     08/10/19 1434           Note:  This document was prepared using Dragon voice recognition software and may include unintentional dictation errors.    Tommi Rumps, PA-C 08/10/19 1452    Willy Eddy, MD 08/10/19 [phone]

## 2019-09-17 ENCOUNTER — Encounter: Payer: Self-pay | Admitting: Emergency Medicine

## 2019-09-17 ENCOUNTER — Emergency Department
Admission: EM | Admit: 2019-09-17 | Discharge: 2019-09-17 | Disposition: A | Discharge status: Home / Self Care | Payer: Medicare Other | Attending: Emergency Medicine | Admitting: Emergency Medicine

## 2019-09-17 ENCOUNTER — Other Ambulatory Visit: Payer: Self-pay

## 2019-09-17 ENCOUNTER — Emergency Department: Payer: Medicare Other

## 2019-09-17 DIAGNOSIS — Z8673 Personal history of transient ischemic attack (TIA), and cerebral infarction without residual deficits: Secondary | ICD-10-CM | POA: Diagnosis not present

## 2019-09-17 DIAGNOSIS — R531 Weakness: Secondary | ICD-10-CM

## 2019-09-17 DIAGNOSIS — W19XXXA Unspecified fall, initial encounter: Secondary | ICD-10-CM | POA: Diagnosis not present

## 2019-09-17 DIAGNOSIS — Z20822 Contact with and (suspected) exposure to covid-19: Secondary | ICD-10-CM | POA: Insufficient documentation

## 2019-09-17 DIAGNOSIS — I10 Essential (primary) hypertension: Secondary | ICD-10-CM | POA: Insufficient documentation

## 2019-09-17 DIAGNOSIS — Z96652 Presence of left artificial knee joint: Secondary | ICD-10-CM | POA: Diagnosis not present

## 2019-09-17 DIAGNOSIS — Z96641 Presence of right artificial hip joint: Secondary | ICD-10-CM | POA: Insufficient documentation

## 2019-09-17 DIAGNOSIS — E039 Hypothyroidism, unspecified: Secondary | ICD-10-CM | POA: Diagnosis not present

## 2019-09-17 DIAGNOSIS — R4182 Altered mental status, unspecified: Secondary | ICD-10-CM | POA: Diagnosis not present

## 2019-09-17 LAB — BASIC METABOLIC PANEL
Anion gap: 7 (ref 5–15)
BUN: 17 mg/dL (ref 8–23)
CO2: 26 mmol/L (ref 22–32)
Calcium: 8.7 mg/dL — ABNORMAL LOW (ref 8.9–10.3)
Chloride: 104 mmol/L (ref 98–111)
Creatinine, Ser: 0.89 mg/dL (ref 0.44–1.00)
GFR calc Af Amer: >60 mL/min (ref >60)
GFR calc non Af Amer: 58 mL/min — ABNORMAL LOW (ref >60)
Glucose, Bld: 97 mg/dL (ref 70–99)
Potassium: 3.5 mmol/L (ref 3.5–5.1)
Sodium: 137 mmol/L (ref 135–145)

## 2019-09-17 LAB — CBC
HCT: 38.8 % (ref 36.0–46.0)
Hemoglobin: 12.5 g/dL (ref 12.0–15.0)
MCH: 27.8 pg (ref 26.0–34.0)
MCHC: 32.2 g/dL (ref 30.0–36.0)
MCV: 86.2 fL (ref 80.0–100.0)
Platelets: 246 10*3/uL (ref 150–400)
RBC: 4.5 MIL/uL (ref 3.87–5.11)
RDW: 20.1 % — ABNORMAL HIGH (ref 11.5–15.5)
WBC: 5.2 10*3/uL (ref 4.0–10.5)
nRBC: 0 % (ref 0.0–0.2)

## 2019-09-17 LAB — SARS CORONAVIRUS 2 BY RT PCR (HOSPITAL ORDER, PERFORMED IN ~~LOC~~ HOSPITAL LAB): SARS Coronavirus 2: NEGATIVE

## 2019-09-17 NOTE — ED Notes (Signed)
Pt BIB [facility] from [facility] with c/o fall that occurred 8 days ago. Facility reports to EMS that pt has declined and has not been at baseline. Facility unsure if pt was ever evaluated.

## 2019-09-17 NOTE — ED Provider Notes (Signed)
ER Provider Note       Time seen: 3:11 PM    I have reviewed the vital signs and the nursing notes.  HISTORY   Chief Complaint Altered Mental Status and Fall   HPI Kerry Cabrera is a 86 y.o. female with a history of arthritis, coronary artery disease, depression, hyperlipidemia, hypertension, hypothyroidism, and insomnia who presents today for worsening weakness and confusion.  Patient recently had a fall, since then has had these worsening symptoms.  She arrives alert and oriented, has bruising on her chin which she states is from the fall.  Past Medical History:  Diagnosis Date  . Arthritis   . Carotid artery occlusion   . Complication of anesthesia    very confused for days  . Coronary artery disease   . Depression   . Glaucoma   . Hyperlipemia   . Hypertension    h/o not on medication  . Hypothyroidism   . Insomnia   . PUD (peptic ulcer disease)    in distant past  . TIA (transient ischemic attack)     Past Surgical History:  Procedure Laterality Date  . CARDIAC CATHETERIZATION    . HIP PINNING,CANNULATED Left 06/21/2019   Procedure: CANNULATED HIP PINNING;  Surgeon: Corky Mull, MD;  Location: [facility] ORS;  Service: Orthopedics;  Laterality: Left;  . KNEE ARTHROPLASTY Left 08/08/2015   Procedure: COMPUTER ASSISTED TOTAL KNEE ARTHROPLASTY;  Surgeon: Dereck Leep, MD;  Location: [facility] ORS;  Service: Orthopedics;  Laterality: Left;  . KNEE ARTHROSCOPY    . Percutaneous pinning of right femoral neck fracture    . TONSILLECTOMY    . TOTAL HIP ARTHROPLASTY Right 02/06/2014   Dr. Earnestine Leys    Allergies Ace inhibitors, Codeine, Gabapentin, Micardis hct [telmisartan-hctz], Prednisone, Sulfa antibiotics, and Zithromax [azithromycin]  Review of Systems Constitutional: Negative for fever. Cardiovascular: Negative for chest pain. Respiratory: Negative for shortness of breath. Gastrointestinal: Negative for abdominal pain, vomiting and  diarrhea. Musculoskeletal: Negative for back pain. Skin: Positive for bruising Neurological: Negative for headaches, positive for weakness  All systems negative/normal/unremarkable except as stated in the HPI  ____________________________________________   PHYSICAL EXAM:  VITAL SIGNS: Vitals:   09/17/19 1101  BP: (!) 168/88  Pulse: 70  Resp: 16  Temp: 98.7 F (37.1 C)  SpO2: 96%    Constitutional: Alert and oriented. Well appearing and in no distress. Eyes: Conjunctivae are normal. Normal extraocular movements. ENT      Head: Normocephalic, there is bruising on her chin      Nose: No congestion/rhinnorhea.      Mouth/Throat: Mucous membranes are moist.      Neck: No stridor. Cardiovascular: Normal rate, regular rhythm. No murmurs, rubs, or gallops. Respiratory: Normal respiratory effort without tachypnea nor retractions. Breath sounds are clear and equal bilaterally. No wheezes/rales/rhonchi. Gastrointestinal: Soft and nontender. Normal bowel sounds Musculoskeletal: Nontender with normal range of motion in extremities. No lower extremity tenderness nor edema. Neurologic:  Normal speech and language. No gross focal neurologic deficits are appreciated.  Skin:  Skin is warm, dry and intact.  Chin bruising is noted Psychiatric: Speech and behavior are normal.  ____________________________________________  EKG: Interpreted by me.  Sinus rhythm with rate of 70 bpm, right superior axis deviation, normal QT  ____________________________________________   LABS (pertinent positives/negatives)  Labs Reviewed  BASIC METABOLIC PANEL - Abnormal; Notable for the following components:      Result Value   Calcium 8.7 (*)    GFR calc non Af  Amer 58 (*)    All other components within normal limits  CBC - Abnormal; Notable for the following components:   RDW 20.1 (*)    All other components within normal limits  SARS CORONAVIRUS 2 BY RT PCR (HOSPITAL ORDER, PERFORMED IN [place]  [facility])  URINALYSIS, COMPLETE (UACMP) WITH MICROSCOPIC  CBG MONITORING, ED    RADIOLOGY  Images were viewed by me CT head IMPRESSION: 1. No evidence of significant acute traumatic injury to the skull or brain. 2. Moderate cerebral and mild cerebellar atrophy with extensive chronic microvascular ischemic changes in the cerebral white matter.   DIFFERENTIAL DIAGNOSIS  Fall, contusion, fracture, subdural, occult infection, dehydration, electrolyte abnormality, dementia  ASSESSMENT AND PLAN  Weakness   Plan: The patient had presented for worsening weakness. Patient's labs thus far have been unremarkable.  CT head was reassuring.  We were unable to obtain a urine specimen after multiple attempts and many hours.  Family states they are ready to take her home.  Daryel November MD    Note: This note was generated in part or whole with voice recognition software. Voice recognition is usually quite accurate but there are transcription errors that can and very often do occur. I apologize for any typographical errors that were not detected and corrected.     Emily Filbert, MD 09/17/19 Jerene Bears

## 2019-09-17 NOTE — ED Triage Notes (Addendum)
Patient to ED via [facility] from [facility]. Per staff, patient had a fall 8 days ago, and since then, she has had worsening weakness and confusion. Patient alert and oriented x4 upon arrival. Bruising noted to chin which patient said is from fall. Patient also complaining of back pain since fall.

## 2019-10-04 ENCOUNTER — Emergency Department: Payer: Medicare Other

## 2019-10-04 ENCOUNTER — Emergency Department
Admission: EM | Admit: 2019-10-04 | Discharge: 2019-10-04 | Disposition: A | Discharge status: Home / Self Care | Payer: Medicare Other | Source: Home / Self Care | Attending: Emergency Medicine | Admitting: Emergency Medicine

## 2019-10-04 ENCOUNTER — Other Ambulatory Visit: Payer: Self-pay

## 2019-10-04 DIAGNOSIS — R410 Disorientation, unspecified: Secondary | ICD-10-CM | POA: Insufficient documentation

## 2019-10-04 DIAGNOSIS — I119 Hypertensive heart disease without heart failure: Secondary | ICD-10-CM | POA: Insufficient documentation

## 2019-10-04 DIAGNOSIS — Z87891 Personal history of nicotine dependence: Secondary | ICD-10-CM | POA: Insufficient documentation

## 2019-10-04 DIAGNOSIS — G9340 Encephalopathy, unspecified: Secondary | ICD-10-CM | POA: Diagnosis not present

## 2019-10-04 DIAGNOSIS — N390 Urinary tract infection, site not specified: Secondary | ICD-10-CM | POA: Insufficient documentation

## 2019-10-04 DIAGNOSIS — W19XXXA Unspecified fall, initial encounter: Secondary | ICD-10-CM | POA: Insufficient documentation

## 2019-10-04 LAB — COMPREHENSIVE METABOLIC PANEL
ALT: 11 U/L (ref 0–44)
AST: 27 U/L (ref 15–41)
Albumin: 3.3 g/dL — ABNORMAL LOW (ref 3.5–5.0)
Alkaline Phosphatase: 58 U/L (ref 38–126)
Anion gap: 11 (ref 5–15)
BUN: 14 mg/dL (ref 8–23)
CO2: 28 mmol/L (ref 22–32)
Calcium: 8.9 mg/dL (ref 8.9–10.3)
Chloride: 99 mmol/L (ref 98–111)
Creatinine, Ser: 1.31 mg/dL — ABNORMAL HIGH (ref 0.44–1.00)
GFR calc Af Amer: 42 mL/min — ABNORMAL LOW (ref >60)
GFR calc non Af Amer: 36 mL/min — ABNORMAL LOW (ref >60)
Glucose, Bld: 109 mg/dL — ABNORMAL HIGH (ref 70–99)
Potassium: 3.5 mmol/L (ref 3.5–5.1)
Sodium: 138 mmol/L (ref 135–145)
Total Bilirubin: 0.7 mg/dL (ref 0.3–1.2)
Total Protein: 6.7 g/dL (ref 6.5–8.1)

## 2019-10-04 LAB — URINALYSIS, COMPLETE (UACMP) WITH MICROSCOPIC
Bilirubin Urine: NEGATIVE
Glucose, UA: NEGATIVE mg/dL
Hgb urine dipstick: NEGATIVE
Ketones, ur: 20 mg/dL — AB
Leukocytes,Ua: NEGATIVE
Nitrite: NEGATIVE
Protein, ur: 100 mg/dL — AB
Specific Gravity, Urine: 1.023 (ref 1.005–1.030)
pH: 5 (ref 5.0–8.0)

## 2019-10-04 LAB — CBC
HCT: 43.9 % (ref 36.0–46.0)
Hemoglobin: 14.3 g/dL (ref 12.0–15.0)
MCH: 27.8 pg (ref 26.0–34.0)
MCHC: 32.6 g/dL (ref 30.0–36.0)
MCV: 85.2 fL (ref 80.0–100.0)
Platelets: 250 10*3/uL (ref 150–400)
RBC: 5.15 MIL/uL — ABNORMAL HIGH (ref 3.87–5.11)
RDW: 19.1 % — ABNORMAL HIGH (ref 11.5–15.5)
WBC: 5.6 10*3/uL (ref 4.0–10.5)
nRBC: 0 % (ref 0.0–0.2)

## 2019-10-04 MED ORDER — FOSFOMYCIN TROMETHAMINE 3 G PO PACK
3.0000 g | PACK | Freq: Once | ORAL | Status: AC
  Administered 2019-10-04: 3 g via ORAL
  Filled 2019-10-04: qty 3

## 2019-10-04 NOTE — Discharge Instructions (Addendum)
You have been seen in the emergency department today.  Your work-up including blood work, EKG, CT scan of the head have resulted normal.  The urine sample shows a possible urinary tract infection you have been treated with a one-time dose of antibiotics.  No further antibiotics are required.  Urine culture has been sent.  Please drink plenty of fluids obtain plenty of rest, return to the emergency department for any symptom personally concerning to yourself or staff member.

## 2019-10-04 NOTE — ED Notes (Signed)
Per [facility] they are calling family for transport

## 2019-10-04 NOTE — ED Notes (Signed)
Patient transported to CT 

## 2019-10-04 NOTE — ED Notes (Signed)
Pt did not use signature pad but verbalized understanding of discharge paperwork.  Report was called to [facility] and instructions reviewed with family as well.

## 2019-10-04 NOTE — ED Provider Notes (Signed)
[facility] Emergency Department Provider Note  Time seen: 12:35 PM  I have reviewed the triage vital signs and the nursing notes.   HISTORY  Chief Complaint Fall and Urinary Tract Infection   HPI Kerry Cabrera is a 86 y.o. female with a past medical history of arthritis, CAD, hypertension, hyperlipidemia, presents to the emergency department for confusion and falls.  According to EMS report patient lives at a facility with independent living.  Over the past 1 month they have noted some increased confusion as well as increased amount of falls.  Had a fall today.  Patient denies any pain.  Does not believe she hit her head.  Denies LOC.  Patient is alert and oriented currently x4.  Denies any medical complaints at this time.   Past Medical History:  Diagnosis Date  . Arthritis   . Carotid artery occlusion   . Complication of anesthesia    very confused for days  . Coronary artery disease   . Depression   . Glaucoma   . Hyperlipemia   . Hypertension    h/o not on medication  . Hypothyroidism   . Insomnia   . PUD (peptic ulcer disease)    in distant past  . TIA (transient ischemic attack)     Patient Active Problem List   Diagnosis Date Noted  . Closed left hip fracture (HCC) 06/21/2019  . Acute respiratory failure with hypoxia (HCC) 06/21/2019  . GERD (gastroesophageal reflux disease) 06/21/2019  . Depression with anxiety 06/21/2019  . Fall 06/21/2019  . Symptomatic bradycardia 11/05/2017  . Abnormal gait 12/17/2016  . Avascular necrosis of femoral head (HCC) 12/17/2016  . Bursitis of hip 12/17/2016  . CAD (coronary artery disease) 12/17/2016  . Closed fracture of intracapsular section of femur (HCC) 12/17/2016  . Closed transcervical fracture of femur (HCC) 12/17/2016  . Hip pain 12/17/2016  . Inflammation of sacroiliac joint (HCC) 12/17/2016  . Osteoarthritis of hip 12/17/2016  . Osteoarthritis of knee 12/17/2016  . Post-traumatic  osteoarthritis 12/17/2016  . Diarrhea 09/20/2016  . Hiatal hernia 09/12/2016  . Acquired hypothyroidism 07/04/2016  . S/P total knee arthroplasty 08/08/2015  . Moderate mitral insufficiency 12/20/2014  . 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  . Hyperlipidemia, mixed 07/09/2013    Past Surgical History:  Procedure Laterality Date  . CARDIAC CATHETERIZATION    . HIP PINNING,CANNULATED Left 06/21/2019   Procedure: CANNULATED HIP PINNING;  Surgeon: Christena Flake, MD;  Location: [facility] ORS;  Service: Orthopedics;  Laterality: Left;  . KNEE ARTHROPLASTY Left 08/08/2015   Procedure: COMPUTER ASSISTED TOTAL KNEE ARTHROPLASTY;  Surgeon: Donato Heinz, MD;  Location: [facility] ORS;  Service: Orthopedics;  Laterality: Left;  . KNEE ARTHROSCOPY    . Percutaneous pinning of right femoral neck fracture    . TONSILLECTOMY    . TOTAL HIP ARTHROPLASTY Right 02/06/2014   Dr. Deeann Saint    Prior to Admission medications   Medication Sig Start Date End Date Taking? Authorizing Provider  donepezil (ARICEPT) 10 MG tablet Take 10 mg by mouth at bedtime. 03/31/19   [provider]  enoxaparin (LOVENOX) 40 MG/0.4ML injection Inject 0.4 mLs (40 mg total) into the skin daily. Patient not taking: Reported on 09/17/2019 06/24/19   Anson Oregon, PA-C  escitalopram (LEXAPRO) 5 MG tablet Take 5 mg by mouth daily. 05/07/19   [provider]  levothyroxine (SYNTHROID) 100 MCG tablet Take 100 mcg by mouth every morning. 05/22/19   [provider]  metoprolol succinate (  TOPROL-XL) 25 MG 24 hr tablet Take 25 mg by mouth at bedtime. 05/07/19   [provider]  Multiple Vitamin (MULTIVITAMIN) tablet Take 1 tablet by mouth daily.    [provider]  omeprazole (PRILOSEC) 40 MG capsule Take 40 mg by mouth 2 (two) times daily. 03/31/19   [provider]  oxyCODONE (OXY IR/ROXICODONE) 5 MG immediate release tablet Take 1-2 tablets (5-10 mg total) by mouth every 4 (four) hours as needed  for moderate pain (pain score 4-6). Patient not taking: Reported on 09/17/2019 06/23/19   Anson Oregon, PA-C  QUEtiapine (SEROQUEL) 50 MG tablet Take 50 mg by mouth at bedtime. 05/04/19   [provider]  sucralfate (CARAFATE) 1 g tablet Take 1 g by mouth 4 (four) times daily. 06/01/19   [provider]  traZODone (DESYREL) 100 MG tablet Take 100 mg by mouth at bedtime. 06/08/19   [provider]    Allergies  Allergen Reactions  . Ace Inhibitors Cough  . Codeine Nausea And Vomiting and Nausea Only  . Gabapentin Other (See Comments)    unsure  . Micardis Hct [Telmisartan-Hctz]     unsure  . Prednisone Other (See Comments)    Pt has received cortisone injection for knee with out problems. Pt states she will not take pills for extended time due to feeling lethargic and weight gain. Weight gain  . Sulfa Antibiotics Other (See Comments) and Rash    Fever 106 degrees,  sent to ER High fever  . Zithromax [Azithromycin] Other (See Comments) and Rash    unsure    Family History  Problem Relation Age of Onset  . Prostate cancer Son   . Kidney disease Neg Hx   . Kidney cancer Neg Hx     Social History Social History   Tobacco Use  . Smoking status: Former Smoker    Years: 12.00  . Smokeless tobacco: Never Used  Substance Use Topics  . Alcohol use: Yes    Comment: osccasional  . Drug use: No    Review of Systems Constitutional: Negative for fever. Cardiovascular: Negative for chest pain. Respiratory: Negative for shortness of breath. Gastrointestinal: Negative for abdominal pain Genitourinary: Negative for urinary compaints Musculoskeletal: Negative for musculoskeletal complaints Neurological: Negative for headache All other ROS negative  ____________________________________________   PHYSICAL EXAM:  VITAL SIGNS: ED Triage Vitals  Enc Vitals Group     BP 10/04/19 1210 (!) 141/71     Pulse Rate 10/04/19 1210 (!) 56     Resp 10/04/19  1210 16     Temp 10/04/19 1210 (!) 97.4 F (36.3 C)     Temp Source 10/04/19 1210 Oral     SpO2 10/04/19 1210 97 %     Weight 10/04/19 1212 139 lb 15.9 oz (63.5 kg)     Height 10/04/19 1212 5\' 7"  (1.702 m)     Head Circumference --      Peak Flow --      Pain Score 10/04/19 1211 0     Pain Loc --      Pain Edu? --      Excl. in GC? --    Constitutional: Alert and oriented. Well appearing and in no distress. Eyes: Normal exam ENT      Head: Normocephalic and atraumatic.      Mouth/Throat: Mucous membranes are moist. Cardiovascular: Normal rate, regular rhythm. Respiratory: Normal respiratory effort without tachypnea nor retractions. Breath sounds are clear  Gastrointestinal: Soft and nontender. No  distention.   Musculoskeletal: Nontender with normal range of motion in all extremities.  Neurologic:  Normal speech and language. No gross focal neurologic deficits  Skin:  Skin is warm, dry and intact.  Psychiatric: Mood and affect are normal.   ____________________________________________    EKG  EKG viewed and interpreted by myself shows a normal sinus rhythm at 56 bpm with a narrow QRS, normal axis, normal intervals, nonspecific ST changes.  ____________________________________________    RADIOLOGY  CT scan of the head shows no acute abnormality.  ____________________________________________   INITIAL IMPRESSION / ASSESSMENT AND PLAN / ED COURSE  Pertinent labs & imaging results that were available during my care of the patient were reviewed by me and considered in my medical decision making (see chart for details).   Patient presents to the emergency department for increased confusion and increased frequency of falls over the past 1 month.  Currently patient appears well, no distress, oriented x4.  Following commands well answering questions appropriately.  Patient has no complaints denies any pain does not believe she hit her head.  Great range of motion in all  extremities without pain elicited.  No chest or abdominal tenderness on exam.  However given the report of increased confusion and increased falls we will check labs including urinalysis and a CT scan of the head as a precaution.  Patient agreeable to plan of care.  Patient's work-up in the emergency department today is overall reassuring.  CT scan is negative, EKG reassuring, lab work largely reassuring as well.  Equivocal urinalysis we will dose with a one-time dose of fosfomycin.  Urine culture has been sent.  Patient remains awake and alert.  I believe the patient is safe for discharge at this time.  GLADA WICKSTROM was evaluated in Emergency Department on 10/04/2019 for the symptoms described in the history of present illness. She was evaluated in the context of the global COVID-19 pandemic, which necessitated consideration that the patient might be at risk for infection with the SARS-CoV-2 virus that causes COVID-19. Institutional protocols and algorithms that pertain to the evaluation of patients at risk for COVID-19 are in a state of rapid change based on information released by regulatory bodies including the CDC and federal and state organizations. These policies and algorithms were followed during the patient's care in the ED.  ____________________________________________   FINAL CLINICAL IMPRESSION(S) / ED DIAGNOSES  Fall Confusion   Harvest Dark, MD 10/04/19 1413

## 2019-10-04 NOTE — ED Triage Notes (Signed)
Pt arrives via [facility] from [facility], pt has fallen twice in the past 24 hours, unwitnessed, pt had a urine test performed Friday and found to have a uti but hasn't started antibiotics yet, pt was reported to be confused for the past month according to staff and staff also reports that patient lives independently.

## 2019-10-05 LAB — URINE CULTURE: Culture: NO GROWTH

## 2019-10-07 ENCOUNTER — Emergency Department: Payer: Medicare Other

## 2019-10-07 ENCOUNTER — Encounter: Payer: Self-pay | Admitting: Emergency Medicine

## 2019-10-07 ENCOUNTER — Inpatient Hospital Stay: Payer: Medicare Other

## 2019-10-07 ENCOUNTER — Other Ambulatory Visit: Payer: Self-pay

## 2019-10-07 ENCOUNTER — Inpatient Hospital Stay
Admission: EM | Admit: 2019-10-07 | Discharge: 2019-10-10 | DRG: 689 | Disposition: A | Discharge status: Home Health | Payer: Medicare Other | Attending: Internal Medicine | Admitting: Internal Medicine

## 2019-10-07 DIAGNOSIS — I2510 Atherosclerotic heart disease of native coronary artery without angina pectoris: Secondary | ICD-10-CM | POA: Diagnosis present

## 2019-10-07 DIAGNOSIS — G9340 Encephalopathy, unspecified: Secondary | ICD-10-CM | POA: Diagnosis present

## 2019-10-07 DIAGNOSIS — E785 Hyperlipidemia, unspecified: Secondary | ICD-10-CM | POA: Diagnosis present

## 2019-10-07 DIAGNOSIS — Z8673 Personal history of transient ischemic attack (TIA), and cerebral infarction without residual deficits: Secondary | ICD-10-CM | POA: Diagnosis not present

## 2019-10-07 DIAGNOSIS — F0390 Unspecified dementia without behavioral disturbance: Secondary | ICD-10-CM | POA: Diagnosis present

## 2019-10-07 DIAGNOSIS — Z66 Do not resuscitate: Secondary | ICD-10-CM | POA: Diagnosis present

## 2019-10-07 DIAGNOSIS — F329 Major depressive disorder, single episode, unspecified: Secondary | ICD-10-CM | POA: Diagnosis present

## 2019-10-07 DIAGNOSIS — Z79899 Other long term (current) drug therapy: Secondary | ICD-10-CM | POA: Diagnosis not present

## 2019-10-07 DIAGNOSIS — R630 Anorexia: Secondary | ICD-10-CM | POA: Diagnosis present

## 2019-10-07 DIAGNOSIS — N390 Urinary tract infection, site not specified: Principal | ICD-10-CM | POA: Diagnosis present

## 2019-10-07 DIAGNOSIS — K8020 Calculus of gallbladder without cholecystitis without obstruction: Secondary | ICD-10-CM | POA: Diagnosis present

## 2019-10-07 DIAGNOSIS — G9341 Metabolic encephalopathy: Secondary | ICD-10-CM | POA: Diagnosis present

## 2019-10-07 DIAGNOSIS — E876 Hypokalemia: Secondary | ICD-10-CM | POA: Diagnosis present

## 2019-10-07 DIAGNOSIS — Z8711 Personal history of peptic ulcer disease: Secondary | ICD-10-CM

## 2019-10-07 DIAGNOSIS — K449 Diaphragmatic hernia without obstruction or gangrene: Secondary | ICD-10-CM | POA: Diagnosis present

## 2019-10-07 DIAGNOSIS — I10 Essential (primary) hypertension: Secondary | ICD-10-CM | POA: Diagnosis present

## 2019-10-07 DIAGNOSIS — R4182 Altered mental status, unspecified: Secondary | ICD-10-CM

## 2019-10-07 DIAGNOSIS — M1990 Unspecified osteoarthritis, unspecified site: Secondary | ICD-10-CM | POA: Diagnosis present

## 2019-10-07 DIAGNOSIS — K219 Gastro-esophageal reflux disease without esophagitis: Secondary | ICD-10-CM | POA: Diagnosis present

## 2019-10-07 DIAGNOSIS — E039 Hypothyroidism, unspecified: Secondary | ICD-10-CM | POA: Diagnosis present

## 2019-10-07 DIAGNOSIS — Z87891 Personal history of nicotine dependence: Secondary | ICD-10-CM

## 2019-10-07 DIAGNOSIS — Z79891 Long term (current) use of opiate analgesic: Secondary | ICD-10-CM | POA: Diagnosis not present

## 2019-10-07 DIAGNOSIS — Z79890 Hormone replacement therapy (postmenopausal): Secondary | ICD-10-CM

## 2019-10-07 DIAGNOSIS — Z20822 Contact with and (suspected) exposure to covid-19: Secondary | ICD-10-CM | POA: Diagnosis present

## 2019-10-07 LAB — CBC WITH DIFFERENTIAL/PLATELET
Abs Immature Granulocytes: 0.02 10*3/uL (ref 0.00–0.07)
Basophils Absolute: 0.1 10*3/uL (ref 0.0–0.1)
Basophils Relative: 1 %
Eosinophils Absolute: 0 10*3/uL (ref 0.0–0.5)
Eosinophils Relative: 1 %
HCT: 38.7 % (ref 36.0–46.0)
Hemoglobin: 12.5 g/dL (ref 12.0–15.0)
Immature Granulocytes: 0 %
Lymphocytes Relative: 32 %
Lymphs Abs: 1.5 10*3/uL (ref 0.7–4.0)
MCH: 27.8 pg (ref 26.0–34.0)
MCHC: 32.3 g/dL (ref 30.0–36.0)
MCV: 86 fL (ref 80.0–100.0)
Monocytes Absolute: 0.4 10*3/uL (ref 0.1–1.0)
Monocytes Relative: 9 %
Neutro Abs: 2.6 10*3/uL (ref 1.7–7.7)
Neutrophils Relative %: 57 %
Platelets: 238 10*3/uL (ref 150–400)
RBC: 4.5 MIL/uL (ref 3.87–5.11)
RDW: 18.9 % — ABNORMAL HIGH (ref 11.5–15.5)
WBC: 4.6 10*3/uL (ref 4.0–10.5)
nRBC: 0 % (ref 0.0–0.2)

## 2019-10-07 LAB — URINALYSIS, COMPLETE (UACMP) WITH MICROSCOPIC
Bacteria, UA: NONE SEEN
Bilirubin Urine: NEGATIVE
Glucose, UA: NEGATIVE mg/dL
Hgb urine dipstick: NEGATIVE
Ketones, ur: 5 mg/dL — AB
Leukocytes,Ua: NEGATIVE
Nitrite: NEGATIVE
Protein, ur: NEGATIVE mg/dL
Specific Gravity, Urine: 1.038 — ABNORMAL HIGH (ref 1.005–1.030)
pH: 6 (ref 5.0–8.0)

## 2019-10-07 LAB — COMPREHENSIVE METABOLIC PANEL
ALT: 11 U/L (ref 0–44)
AST: 21 U/L (ref 15–41)
Albumin: 3.1 g/dL — ABNORMAL LOW (ref 3.5–5.0)
Alkaline Phosphatase: 55 U/L (ref 38–126)
Anion gap: 9 (ref 5–15)
BUN: 15 mg/dL (ref 8–23)
CO2: 29 mmol/L (ref 22–32)
Calcium: 8.6 mg/dL — ABNORMAL LOW (ref 8.9–10.3)
Chloride: 100 mmol/L (ref 98–111)
Creatinine, Ser: 0.96 mg/dL (ref 0.44–1.00)
GFR calc Af Amer: >60 mL/min (ref >60)
GFR calc non Af Amer: 53 mL/min — ABNORMAL LOW (ref >60)
Glucose, Bld: 104 mg/dL — ABNORMAL HIGH (ref 70–99)
Potassium: 3.1 mmol/L — ABNORMAL LOW (ref 3.5–5.1)
Sodium: 138 mmol/L (ref 135–145)
Total Bilirubin: 0.7 mg/dL (ref 0.3–1.2)
Total Protein: 6.3 g/dL — ABNORMAL LOW (ref 6.5–8.1)

## 2019-10-07 LAB — CBC
HCT: 39.1 % (ref 36.0–46.0)
Hemoglobin: 12.7 g/dL (ref 12.0–15.0)
MCH: 27.9 pg (ref 26.0–34.0)
MCHC: 32.5 g/dL (ref 30.0–36.0)
MCV: 85.9 fL (ref 80.0–100.0)
Platelets: 215 10*3/uL (ref 150–400)
RBC: 4.55 MIL/uL (ref 3.87–5.11)
RDW: 18.8 % — ABNORMAL HIGH (ref 11.5–15.5)
WBC: 4.7 10*3/uL (ref 4.0–10.5)
nRBC: 0 % (ref 0.0–0.2)

## 2019-10-07 LAB — VITAMIN B12: Vitamin B-12: 1557 pg/mL — ABNORMAL HIGH (ref 180–914)

## 2019-10-07 LAB — AMMONIA: Ammonia: 10 umol/L (ref 9–35)

## 2019-10-07 LAB — LIPASE, BLOOD: Lipase: 47 U/L (ref 11–51)

## 2019-10-07 LAB — TSH: TSH: 67.08 u[IU]/mL — ABNORMAL HIGH (ref 0.350–4.500)

## 2019-10-07 LAB — T4, FREE: Free T4: 0.33 ng/dL — ABNORMAL LOW (ref 0.61–1.12)

## 2019-10-07 MED ORDER — ESCITALOPRAM OXALATE 10 MG PO TABS
5.0000 mg | ORAL_TABLET | Freq: Every day | ORAL | Status: DC
  Administered 2019-10-08 – 2019-10-10 (×2): 5 mg via ORAL
  Filled 2019-10-07 (×3): qty 0.5

## 2019-10-07 MED ORDER — TRAZODONE HCL 50 MG PO TABS
25.0000 mg | ORAL_TABLET | Freq: Every evening | ORAL | Status: DC | PRN
  Filled 2019-10-07: qty 1

## 2019-10-07 MED ORDER — QUETIAPINE FUMARATE 25 MG PO TABS
50.0000 mg | ORAL_TABLET | Freq: Every day | ORAL | Status: DC
  Administered 2019-10-08 – 2019-10-09 (×2): 50 mg via ORAL
  Filled 2019-10-07 (×3): qty 2

## 2019-10-07 MED ORDER — ACETAMINOPHEN 325 MG PO TABS
650.0000 mg | ORAL_TABLET | Freq: Four times a day (QID) | ORAL | Status: DC | PRN
  Administered 2019-10-09 – 2019-10-10 (×2): 650 mg via ORAL
  Filled 2019-10-07 (×2): qty 2

## 2019-10-07 MED ORDER — ONDANSETRON HCL 4 MG/2ML IJ SOLN
4.0000 mg | Freq: Four times a day (QID) | INTRAMUSCULAR | Status: DC | PRN
  Administered 2019-10-09: 07:00:00 4 mg via INTRAVENOUS
  Filled 2019-10-07: qty 2

## 2019-10-07 MED ORDER — DONEPEZIL HCL 5 MG PO TABS
10.0000 mg | ORAL_TABLET | Freq: Every day | ORAL | Status: DC
  Administered 2019-10-08 – 2019-10-09 (×2): 10 mg via ORAL
  Filled 2019-10-07 (×3): qty 2

## 2019-10-07 MED ORDER — LEVOTHYROXINE SODIUM 50 MCG PO TABS: 100.0000 ug | ORAL_TABLET | Freq: Every morning | ORAL | Status: DC

## 2019-10-07 MED ORDER — METOPROLOL SUCCINATE ER 25 MG PO TB24
25.0000 mg | ORAL_TABLET | Freq: Every day | ORAL | Status: DC
  Administered 2019-10-08 – 2019-10-09 (×2): 25 mg via ORAL
  Filled 2019-10-07 (×3): qty 1

## 2019-10-07 MED ORDER — MAGNESIUM HYDROXIDE 400 MG/5ML PO SUSP
30.0000 mL | Freq: Every day | ORAL | Status: DC | PRN
  Filled 2019-10-07: qty 30

## 2019-10-07 MED ORDER — ONDANSETRON HCL 4 MG PO TABS: 4.0000 mg | ORAL_TABLET | Freq: Four times a day (QID) | ORAL | Status: DC | PRN

## 2019-10-07 MED ORDER — ACETAMINOPHEN 650 MG RE SUPP: 650.0000 mg | Freq: Four times a day (QID) | RECTAL | Status: DC | PRN

## 2019-10-07 MED ORDER — TRAZODONE HCL 50 MG PO TABS
100.0000 mg | ORAL_TABLET | Freq: Every day | ORAL | Status: DC
  Administered 2019-10-08 – 2019-10-09 (×2): 100 mg via ORAL
  Filled 2019-10-07 (×3): qty 2

## 2019-10-07 MED ORDER — PANTOPRAZOLE SODIUM 40 MG PO TBEC
40.0000 mg | DELAYED_RELEASE_TABLET | Freq: Every day | ORAL | Status: DC
  Administered 2019-10-08 – 2019-10-10 (×3): 40 mg via ORAL
  Filled 2019-10-07 (×3): qty 1

## 2019-10-07 MED ORDER — ASPIRIN EC 81 MG PO TBEC
81.0000 mg | DELAYED_RELEASE_TABLET | Freq: Every day | ORAL | Status: DC
  Administered 2019-10-08 – 2019-10-10 (×3): 81 mg via ORAL
  Filled 2019-10-07 (×3): qty 1

## 2019-10-07 MED ORDER — SODIUM CHLORIDE 0.9 % IV SOLN: INTRAVENOUS | Status: DC

## 2019-10-07 MED ORDER — SUCRALFATE 1 G PO TABS
1.0000 g | ORAL_TABLET | Freq: Four times a day (QID) | ORAL | Status: DC
  Administered 2019-10-08 – 2019-10-10 (×7): 1 g via ORAL
  Filled 2019-10-07 (×8): qty 1

## 2019-10-07 MED ORDER — LEVOTHYROXINE SODIUM 25 MCG PO TABS
125.0000 ug | ORAL_TABLET | Freq: Every morning | ORAL | Status: DC
  Administered 2019-10-08 – 2019-10-10 (×2): 125 ug via ORAL
  Filled 2019-10-07 (×2): qty 1

## 2019-10-07 MED ORDER — ENOXAPARIN SODIUM 40 MG/0.4ML ~~LOC~~ SOLN
40.0000 mg | SUBCUTANEOUS | Status: DC
  Administered 2019-10-08 – 2019-10-09 (×2): 40 mg via SUBCUTANEOUS
  Filled 2019-10-07 (×3): qty 0.4

## 2019-10-07 MED ORDER — ADULT MULTIVITAMIN W/MINERALS CH
1.0000 | ORAL_TABLET | Freq: Every day | ORAL | Status: DC
  Administered 2019-10-08 – 2019-10-10 (×3): 1 via ORAL
  Filled 2019-10-07 (×3): qty 1

## 2019-10-07 MED ORDER — IOHEXOL 300 MG/ML  SOLN
100.0000 mL | Freq: Once | INTRAMUSCULAR | Status: AC | PRN
  Administered 2019-10-07: 100 mL via INTRAVENOUS

## 2019-10-07 NOTE — ED Provider Notes (Signed)
[facility] Provider Note   ____________________________________________   First MD Initiated Contact with Patient 10/07/19 1527     (approximate)  I have reviewed the triage vital signs and the nursing notes.   HISTORY  Chief Complaint Urinary Tract Infection and Anorexia    HPI Kerry Cabrera is a 86 y.o. female who was seen 3 days ago for increased confusion and falls.  She reportedly lives in independent living at a facility.  They have been concerned because she has been increasingly confused over the last month or so.  When she was seen 3 days ago however she was not confused she was alert and oriented answers all questions appropriately.  Today when I see her she is very confused.  She says she is living with the neighbors to check and see how they eat.  She says she is not eating much at all because eating makes her nauseous.  Comparing labs from 3 days ago till today her H&H is dropped slightly BUN is stable and creatinine has improved otherwise things are fairly stable.        Past Medical History:  Diagnosis Date  . Arthritis   . Carotid artery occlusion   . Complication of anesthesia    very confused for days  . Coronary artery disease   . Depression   . Glaucoma   . Hyperlipemia   . Hypertension    h/o not on medication  . Hypothyroidism   . Insomnia   . PUD (peptic ulcer disease)    in distant past  . TIA (transient ischemic attack)     Patient Active Problem List   Diagnosis Date Noted  . Acute encephalopathy 10/07/2019  . Closed left hip fracture (HCC) 06/21/2019  . Acute respiratory failure with hypoxia (HCC) 06/21/2019  . GERD (gastroesophageal reflux disease) 06/21/2019  . Depression with anxiety 06/21/2019  . Fall 06/21/2019  . Symptomatic bradycardia 11/05/2017  . Abnormal gait 12/17/2016  . Avascular necrosis of femoral head (HCC) 12/17/2016  . Bursitis of hip 12/17/2016  . CAD (coronary  artery disease) 12/17/2016  . Closed fracture of intracapsular section of femur (HCC) 12/17/2016  . Closed transcervical fracture of femur (HCC) 12/17/2016  . Hip pain 12/17/2016  . Inflammation of sacroiliac joint (HCC) 12/17/2016  . Osteoarthritis of hip 12/17/2016  . Osteoarthritis of knee 12/17/2016  . Post-traumatic osteoarthritis 12/17/2016  . Diarrhea 09/20/2016  . Hiatal hernia 09/12/2016  . Acquired hypothyroidism 07/04/2016  . S/P total knee arthroplasty 08/08/2015  . Moderate mitral insufficiency 12/20/2014  . 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  . Hyperlipidemia, mixed 07/09/2013    Past Surgical History:  Procedure Laterality Date  . CARDIAC CATHETERIZATION    . HIP PINNING,CANNULATED Left 06/21/2019   Procedure: CANNULATED HIP PINNING;  Surgeon: Christena Flake, MD;  Location: [facility] OR[facility];  Service: Orthopedics;  Laterality: Left;  . KNEE ARTHROPLASTY Left 08/08/2015   Procedure: COMPUTER ASSISTED TOTAL KNEE ARTHROPLASTY;  Surgeon: Donato Heinz, MD;  Location: [facility] OR[facility];  Service: Orthopedics;  Laterality: Left;  . KNEE ARTHROSCOPY    . Percutaneous pinning of right femoral neck fracture    . TONSILLECTOMY    . TOTAL HIP ARTHROPLASTY Right 02/06/2014   Dr. Deeann Saint    Prior to Admission medications   Medication Sig Start Date End Date Taking? Authorizing Provider  donepezil (ARICEPT) 10 MG tablet Take 10 mg by mouth at bedtime. 03/31/19   [provider]  escitalopram (LEXAPRO) 5 MG  tablet Take 5 mg by mouth daily. 05/07/19   [provider]  levothyroxine (SYNTHROID) 100 MCG tablet Take 100 mcg by mouth every morning. 05/22/19   [provider]  metoprolol succinate (TOPROL-XL) 25 MG 24 hr tablet Take 25 mg by mouth at bedtime. 05/07/19   [provider]  Multiple Vitamin (MULTIVITAMIN) tablet Take 1 tablet by mouth daily.    [provider]  omeprazole (PRILOSEC) 40 MG capsule Take 40 mg by mouth 2 (two) times daily. 03/31/19    [provider]  QUEtiapine (SEROQUEL) 50 MG tablet Take 50 mg by mouth at bedtime. 05/04/19   [provider]  sucralfate (CARAFATE) 1 g tablet Take 1 g by mouth 4 (four) times daily. 06/01/19   [provider]  traZODone (DESYREL) 100 MG tablet Take 100 mg by mouth at bedtime. 06/08/19   [provider]    Allergies Ace inhibitors, Codeine, Gabapentin, Micardis hct [telmisartan-hctz], Prednisone, Sulfa antibiotics, and Zithromax [azithromycin]  Family History  Problem Relation Age of Onset  . Prostate cancer Son   . Kidney disease Neg Hx   . Kidney cancer Neg Hx     Social History Social History   Tobacco Use  . Smoking status: Former Smoker    Years: 12.00  . Smokeless tobacco: Never Used  Substance Use Topics  . Alcohol use: Yes    Comment: osccasional  . Drug use: No    Review of Systems  Constitutional: No fever/chills Eyes: No visual changes. ENT: No sore throat. Cardiovascular: Denies chest pain. Respiratory: Denies shortness of breath. Gastrointestinal: No abdominal pain.   nausea with eating, no vomiting.  No diarrhea.  No constipation. Genitourinary: Negative for dysuria. Musculoskeletal: Negative for back pain. Skin: Negative for rash. Neurological: Negative for headaches, focal weakness or   ____________________________________________   PHYSICAL EXAM:  VITAL SIGNS: ED Triage Vitals  Enc Vitals Group     BP 10/07/19 1124 (!) 157/90     Pulse Rate 10/07/19 1124 66     Resp 10/07/19 1124 16     Temp 10/07/19 1124 98.5 F (36.9 C)     Temp Source 10/07/19 1124 Oral     SpO2 10/07/19 1124 96 %     Weight 10/07/19 1126 140 lb (63.5 kg)     Height 10/07/19 1126 5\' 6"  (1.676 m)     Head Circumference --      Peak Flow --      Pain Score --      Pain Loc --      Pain Edu? --      Excl. in GC? --    Constitutional: Alert but confused. Eyes: Conjunctivae are normal. PER. EOMI. Head: Atraumatic. Nose: No  congestion/rhinnorhea. Mouth/Throat: Mucous membranes are moist.  Oropharynx non-erythematous. Neck: No stridor.  No spinal tenderness to palpation full range of motion that is painless. Cardiovascular: Normal rate, regular rhythm. Grossly normal heart sounds.  Good peripheral circulation. Respiratory: Normal respiratory effort.  No retractions. Lungs CTAB. Gastrointestinal: Soft and nontender. No distention. No abdominal bruits. No CVA tenderness. Musculoskeletal: No lower extremity tenderness nor edema.   Neurologic:  Normal speech and language. No gross focal neurologic deficits are appreciated. Skin:  Skin is warm, dry and intact. No rash noted.   ____________________________________________   LABS (all labs ordered are listed, but only abnormal results are displayed)  Labs Reviewed  COMPREHENSIVE METABOLIC PANEL - Abnormal; Notable for the following components:      Result Value  Potassium 3.1 (*)    Glucose, Bld 104 (*)    Calcium 8.6 (*)    Total Protein 6.3 (*)    Albumin 3.1 (*)    GFR calc non Af Amer 53 (*)    All other components within normal limits  CBC - Abnormal; Notable for the following components:   RDW 18.8 (*)    All other components within normal limits  URINALYSIS, COMPLETE (UACMP) WITH MICROSCOPIC - Abnormal; Notable for the following components:   Color, Urine YELLOW (*)    APPearance HAZY (*)    Specific Gravity, Urine 1.038 (*)    Ketones, ur 5 (*)    All other components within normal limits  TSH - Abnormal; Notable for the following components:   TSH 67.080 (*)    All other components within normal limits  CBC WITH DIFFERENTIAL/PLATELET - Abnormal; Notable for the following components:   RDW 18.9 (*)    All other components within normal limits  VITAMIN B12 - Abnormal; Notable for the following components:   Vitamin B-12 1,557 (*)    All other components within normal limits  T4, FREE - Abnormal; Notable for the following components:   Free  T4 0.33 (*)    All other components within normal limits  SARS CORONAVIRUS 2 BY RT PCR (HOSPITAL ORDER, PERFORMED IN [facility])  AMMONIA  LIPASE, BLOOD  FOLATE RBC  BASIC METABOLIC PANEL  CBC  LIPID PANEL  FOLATE  T3, FREE  MAGNESIUM   ____________________________________________  EKG   ____________________________________________  RADIOLOGY  ED MD interpretation: Chest x-ray read by radiology reviewed by me shows no acute disease just a large hiatal hernia.  CT of the head since the patient fell a second time in 2 days read by radiology reviewed by me shows no acute disease and the CT of the abdomen and pelvis read by radiology reviewed by me shows no acute disease there is a gallstone that is inflaming the gallbladder and the very large hiatal hernia containing parts of the stomach and pancreas.  This appears to be stable.  That may explain her lack of appetite though.  Official radiology report(s): CT Head Wo Contrast  Result Date: 10/07/2019 CLINICAL DATA:  Head injury after fall 3 days ago. EXAM: CT HEAD WITHOUT CONTRAST TECHNIQUE: Contiguous axial images were obtained from the base of the skull through the vertex without intravenous contrast. COMPARISON:  October 04, 2019. FINDINGS: Brain: Mild diffuse cortical atrophy is noted. Mild chronic ischemic white matter disease is noted. No mass effect or midline shift is noted. Ventricular size is within normal limits. There is no evidence of mass lesion, hemorrhage or acute infarction. Vascular: No hyperdense vessel or unexpected calcification. Skull: Normal. Negative for fracture or focal lesion. Sinuses/Orbits: No acute finding. Other: None. IMPRESSION: Mild diffuse cortical atrophy. Mild chronic ischemic white matter disease. No acute intracranial abnormality seen. Electronically Signed   By: Lupita Raider M.D.   On: 10/07/2019 16:46   MR BRAIN WO CONTRAST  Result Date: 10/07/2019 CLINICAL DATA:  Initial evaluation  for acute encephalopathy. EXAM: MRI HEAD WITHOUT CONTRAST TECHNIQUE: Multiplanar, multiecho pulse sequences of the brain and surrounding structures were obtained without intravenous contrast. COMPARISON:  Comparison made with prior CT from earlier the same day. FINDINGS: Brain: Moderately advanced age-related cerebral atrophy with chronic small vessel ischemic disease. No abnormal foci of restricted diffusion to suggest acute or subacute ischemia. Gray-white matter differentiation maintained. No encephalomalacia to suggest chronic cortical infarction.  No acute intracranial hemorrhage. Few scattered punctate chronic micro hemorrhages noted, likely due to underlying hypertension and/or small vessel disease. No mass lesion, midline shift or mass effect. No hydrocephalus or extra-axial fluid collection. Pituitary gland suprasellar region within normal limits. Midline structures intact. Vascular: Major intracranial vascular flow voids are maintained. Skull and upper cervical spine: Craniocervical junction within normal limits. Bone marrow signal intensity normal. No scalp soft tissue abnormality. Sinuses/Orbits: Patient status post bilateral ocular lens replacement. Globes and orbital soft tissues demonstrate no acute finding. Left maxillary sinus retention cyst noted. Paranasal sinuses are otherwise clear. No significant mastoid effusion. Inner ear structures grossly normal. Other: None. IMPRESSION: 1. No acute intracranial abnormality. 2. Moderately advanced age-related cerebral atrophy with chronic small vessel ischemic disease. Electronically Signed   By: Rise MuBenjamin  McClintock M.D.   On: 10/07/2019 22:44   CT ABDOMEN PELVIS W CONTRAST  Result Date: 10/07/2019 CLINICAL DATA:  Nausea and vomiting. Possible UTI. Poor p.o. intake. EXAM: CT ABDOMEN AND PELVIS WITH CONTRAST TECHNIQUE: Multidetector CT imaging of the abdomen and pelvis was performed using the standard protocol following bolus administration of  intravenous contrast. CONTRAST:  100mL OMNIPAQUE IOHEXOL 300 MG/ML  SOLN COMPARISON:  September 19, 2018 FINDINGS: Lower chest: The lung bases are clear.The heart size is mildly enlarged. Coronary artery calcifications are noted. Hepatobiliary: The liver is normal. Cholelithiasis without acute inflammation.There is no biliary ductal dilation. Pancreas: The pancreatic duct is dilated which is stable since 2020. the pancreas is herniated into the thoracic cavity. Spleen: Unremarkable. Adrenals/Urinary Tract: --Adrenal glands: Unremarkable. --Right kidney/ureter: No hydronephrosis or radiopaque kidney stones. --Left kidney/ureter: No hydronephrosis or radiopaque kidney stones. --Urinary bladder: The bladder is suboptimally evaluated secondary to extensive streak artifact through the patient's pelvis. The visualized portions are unremarkable. Stomach/Bowel: --Stomach/Duodenum: The majority of the stomach is herniated into the thoracic cavity as before. There is no definite evidence for an obstruction. --Small bowel: Unremarkable. --Colon: There is an above average amount of stool throughout the visualized portions of the colon. There are scattered colonic diverticula without CT evidence for diverticulitis. --Appendix: Normal. Vascular/Lymphatic: Atherosclerotic calcification is present within the non-aneurysmal abdominal aorta, without hemodynamically significant stenosis. --No retroperitoneal lymphadenopathy. --No mesenteric lymphadenopathy. --No pelvic or inguinal lymphadenopathy. Reproductive: Unremarkable Other: No ascites or free air. The abdominal wall is normal. Musculoskeletal. There is a compression fracture of the T12 vertebral body with approximately 60% height loss anteriorly. This was identified in May of 2021 and is essentially stable. There is no evidence for a new compression fracture. The patient is status post total hip arthroplasty on the right. The hardware appears grossly intact. The patient is status  post prior percutaneous pinning of the left hip. The hardware appears essentially stable from the patient's prior studies IMPRESSION: 1. No acute abnormality. 2. Again noted is a large hernia containing most of the patient's stomach and portions of the pancreas. The appearance is essentially unchanged since prior study. There is no definite evidence for high-grade obstruction. 3. Cholelithiasis without acute inflammation. Aortic Atherosclerosis (ICD10-I70.0). Electronically Signed   By: Katherine Mantlehristopher  Green M.D.   On: 10/07/2019 16:52   DG Chest Portable 1 View  Result Date: 10/07/2019 CLINICAL DATA:  Altered mental status. EXAM: PORTABLE CHEST 1 VIEW COMPARISON:  June 21, 2019. FINDINGS: Stable cardiomediastinal silhouette. Stable large hiatal hernia is noted. No pneumothorax or pleural effusion is noted. Lungs are clear. Bony thorax is unremarkable. IMPRESSION: No active disease. Stable large hiatal hernia. Electronically Signed   By: Lupita RaiderJames  Green Jr  M.D.   On: 10/07/2019 16:33    ____________________________________________   PROCEDURES  Procedure(s) performed (including Critical Care):  Procedures   ____________________________________________   INITIAL IMPRESSION / ASSESSMENT AND PLAN / ED COURSE  Patient is TGs is very high.  This could easily explain her dementia symptoms.  She however is unable in her current mental state to go back to independent living.  We will admit her here consult social worker for placement in skilled nursing facility and begin treating her hypothyroidism in the meantime.  Hopefully with treatment her mental status will improve and she then can go back to her independent living.  Currently there is no other obvious cause of her altered mental status.              ____________________________________________   FINAL CLINICAL IMPRESSION(S) / ED DIAGNOSES  Final diagnoses:  Altered mental status, unspecified altered mental status type    Hypothyroidism, unspecified type     ED Discharge Orders    None       Note:  This document was prepared using Dragon voice recognition software and may include unintentional dictation errors.    Arnaldo Natal, MD 10/08/19 [phone]

## 2019-10-07 NOTE — ED Notes (Signed)
Pt returned from CT at this time. Pt visualized in NAD, pt repositioned in bed at this time by this RN. Lights dimmed per patient request. Call bell within reach at this time.

## 2019-10-07 NOTE — ED Notes (Signed)
Pt given 2 more warm blankets, given pillow. Pt continues to be confused at this time. NAD noted at this time. VSS at this time.

## 2019-10-07 NOTE — ED Notes (Signed)
Here for confusion couple days ago.  Pt trying to get up. Encouraged to remain in wheel chair. Pt in front of security and where RN can see at all times.

## 2019-10-07 NOTE — ED Notes (Signed)
This RN to bedside with Gabby, EDT. Pt placed in clean, dry brief. In and out performed by this RN and Joyice Faster, EDT. Pt tolerated well.

## 2019-10-07 NOTE — ED Notes (Signed)
Pt taken to MRI  

## 2019-10-07 NOTE — ED Triage Notes (Signed)
Patient from [facility]. MD at facility sent patient for evaluation of possible UTI as well as poor PO intake.

## 2019-10-07 NOTE — ED Notes (Signed)
Pt calling out for bedpan.  Pt had BM and urine in bedpan.  Pt cleaned and new brief placed.

## 2019-10-07 NOTE — H&P (Addendum)
[place] [facility]   PATIENT NAME: Kerry Cabrera    MR#:  [id number]  DATE OF BIRTH:  27-Oct-1928  DATE OF ADMISSION:  10/07/2019  PRIMARY CARE PHYSICIAN: [facility]   REQUESTING/REFERRING PHYSICIAN: Dorothea Glassman, MD  CHIEF COMPLAINT:   Chief Complaint  Patient presents with  . Urinary Tract Infection  . Anorexia    HISTORY OF PRESENT ILLNESS:  Kerry Cabrera  is a 86 y.o. Caucasian female with a known history of dementia, depression, hypertension, dyslipidemia and hypothyroidism, who presented to the emergency room with acute onset of recurrent altered mental status with confusion.  The patient was seen in the ER here 3 days ago for confusion at her independent living facility and was thought to have possible UTI here in the ER.  She was given fosfomycin and in the ER she was back to her baseline.  According to her friend and neighbor who was with her in the emergency room, she has not been eating lately and has been sleeping a lot.  She has been losing weight.  No nausea or vomiting or diarrhea or abdominal pain.  No chest pain or dyspnea or palpitations or cough or wheezing.  Upon presentation to the emergency room, blood pressure was 157/90 with otherwise normal vital signs.  Labs revealed negative urinalysis, hypokalemia of 3.1, with otherwise unremarkable CMP, ammonia level of 10 and unremarkable CBC.  TSH came back elevated at 67.  Portable chest ray showed stable large hiatal hernia no acute cardiopulmonary disease.  Abdominal and pelvic CT scan showed cholelithiasis without cholecystitis, large hiatal hernia containing most of the patient's stomach and portions of the pancreas with unchanged appearance since prior study and no evidence for high-grade obstruction with aortic atherosclerosis and no other acute abnormalities.  Head CT scan revealed mild diffuse cortical atrophy and mild chronic ischemic white matter disease with no acute intracranial  abnormality seen.  The patient will be admitted to a medical monitored bed for further evaluation and management.   PAST MEDICAL HISTORY:   Past Medical History:  Diagnosis Date  . Arthritis   . Carotid artery occlusion   . Complication of anesthesia    very confused for days  . Coronary artery disease   . Depression   . Glaucoma   . Hyperlipemia   . Hypertension    h/o not on medication  . Hypothyroidism   . Insomnia   . PUD (peptic ulcer disease)    in distant past  . TIA (transient ischemic attack)     PAST SURGICAL HISTORY:   Past Surgical History:  Procedure Laterality Date  . CARDIAC CATHETERIZATION    . HIP PINNING,CANNULATED Left 06/21/2019   Procedure: CANNULATED HIP PINNING;  Surgeon: Christena Flake, MD;  Location: [facility] ORS;  Service: Orthopedics;  Laterality: Left;  . KNEE ARTHROPLASTY Left 08/08/2015   Procedure: COMPUTER ASSISTED TOTAL KNEE ARTHROPLASTY;  Surgeon: Donato Heinz, MD;  Location: [facility] ORS;  Service: Orthopedics;  Laterality: Left;  . KNEE ARTHROSCOPY    . Percutaneous pinning of right femoral neck fracture    . TONSILLECTOMY    . TOTAL HIP ARTHROPLASTY Right 02/06/2014   Dr. Deeann Saint    SOCIAL HISTORY:   Social History   Tobacco Use  . Smoking status: Former Smoker    Years: 12.00  . Smokeless tobacco: Never Used  Substance Use Topics  . Alcohol use: Yes    Comment: osccasional    FAMILY HISTORY:  Family History  Problem Relation Age of Onset  . Prostate cancer Son   . Kidney disease Neg Hx   . Kidney cancer Neg Hx     DRUG ALLERGIES:   Allergies  Allergen Reactions  . Ace Inhibitors Cough  . Codeine Nausea And Vomiting and Nausea Only  . Gabapentin Other (See Comments)    unsure  . Micardis Hct [Telmisartan-Hctz]     unsure  . Prednisone Other (See Comments)    Pt has received cortisone injection for knee with out problems. Pt states she will not take pills for extended time due to feeling lethargic and weight  gain. Weight gain  . Sulfa Antibiotics Other (See Comments) and Rash    Fever 106 degrees,  sent to ER High fever  . Zithromax [Azithromycin] Other (See Comments) and Rash    unsure    REVIEW OF SYSTEMS:   ROS As per history of present illness. All pertinent systems were reviewed above. Constitutional,  HEENT, cardiovascular, respiratory, GI, GU, musculoskeletal, neuro, psychiatric, endocrine,  integumentary and hematologic systems were reviewed and are otherwise  negative/unremarkable except for positive findings mentioned above in the HPI.   MEDICATIONS AT HOME:   Prior to Admission medications   Medication Sig Start Date End Date Taking? Authorizing Provider  donepezil (ARICEPT) 10 MG tablet Take 10 mg by mouth at bedtime. 03/31/19   [provider]  enoxaparin (LOVENOX) 40 MG/0.4ML injection Inject 0.4 mLs (40 mg total) into the skin daily. Patient not taking: Reported on 09/17/2019 06/24/19   Anson OregonMcGhee, James Lance, PA-C  escitalopram (LEXAPRO) 5 MG tablet Take 5 mg by mouth daily. 05/07/19   [provider]  levothyroxine (SYNTHROID) 100 MCG tablet Take 100 mcg by mouth every morning. 05/22/19   [provider]  metoprolol succinate (TOPROL-XL) 25 MG 24 hr tablet Take 25 mg by mouth at bedtime. 05/07/19   [provider]  Multiple Vitamin (MULTIVITAMIN) tablet Take 1 tablet by mouth daily.    [provider]  omeprazole (PRILOSEC) 40 MG capsule Take 40 mg by mouth 2 (two) times daily. 03/31/19   [provider]  oxyCODONE (OXY IR/ROXICODONE) 5 MG immediate release tablet Take 1-2 tablets (5-10 mg total) by mouth every 4 (four) hours as needed for moderate pain (pain score 4-6). Patient not taking: Reported on 09/17/2019 06/23/19   Anson OregonMcGhee, James Lance, PA-C  QUEtiapine (SEROQUEL) 50 MG tablet Take 50 mg by mouth at bedtime. 05/04/19   [provider]  sucralfate (CARAFATE) 1 g tablet Take 1 g by mouth 4 (four) times daily.  06/01/19   [provider]  traZODone (DESYREL) 100 MG tablet Take 100 mg by mouth at bedtime. 06/08/19   [provider]      VITAL SIGNS:  Blood pressure (!) 162/72, pulse (!) 51, temperature 98.5 F (36.9 C), temperature source Oral, resp. rate 18, height 5\' 6"  (1.676 m), weight 63.5 kg, SpO2 97 %.  PHYSICAL EXAMINATION:  Physical Exam  GENERAL:  86 y.o.-year-old Caucasian female patient lying in the bed with no acute distress.  EYES: Pupils equal, round, reactive to light and accommodation. No scleral icterus. Extraocular muscles intact.  HEENT: Head atraumatic, normocephalic. Oropharynx and nasopharynx clear.  NECK:  Supple, no jugular venous distention. No thyroid enlargement, no tenderness.  LUNGS: Normal breath sounds bilaterally, no wheezing, rales,rhonchi or crepitation. No use of accessory muscles of respiration.  CARDIOVASCULAR: Regular rate and rhythm, S1, S2 normal. No murmurs, rubs, or gallops.  ABDOMEN: Soft,  nondistended, nontender. Bowel sounds present. No organomegaly or mass.  EXTREMITIES: No pedal edema, cyanosis, or clubbing.  NEUROLOGIC: Cranial nerves II through XII are intact. Muscle strength 5/5 in all extremities. Sensation intact. Gait not checked.  PSYCHIATRIC: The patient is alert and oriented only to her name.  Normal affect and good eye contact. SKIN: No obvious rash, lesion, or ulcer.   LABORATORY PANEL:   CBC Recent Labs  Lab 10/07/19 1125  WBC 4.6  4.7  HGB 12.5  12.7  HCT 38.7  39.1  PLT 238  215   ------------------------------------------------------------------------------------------------------------------  Chemistries  Recent Labs  Lab 10/07/19 1125  NA 138  K 3.1*  CL 100  CO2 29  GLUCOSE 104*  BUN 15  CREATININE 0.96  CALCIUM 8.6*  AST 21  ALT 11  ALKPHOS 55  BILITOT 0.7   ------------------------------------------------------------------------------------------------------------------  Cardiac  Enzymes No results for input(s): TROPONINI in the last 168 hours. ------------------------------------------------------------------------------------------------------------------  RADIOLOGY:  CT Head Wo Contrast  Result Date: 10/07/2019 CLINICAL DATA:  Head injury after fall 3 days ago. EXAM: CT HEAD WITHOUT CONTRAST TECHNIQUE: Contiguous axial images were obtained from the base of the skull through the vertex without intravenous contrast. COMPARISON:  October 04, 2019. FINDINGS: Brain: Mild diffuse cortical atrophy is noted. Mild chronic ischemic white matter disease is noted. No mass effect or midline shift is noted. Ventricular size is within normal limits. There is no evidence of mass lesion, hemorrhage or acute infarction. Vascular: No hyperdense vessel or unexpected calcification. Skull: Normal. Negative for fracture or focal lesion. Sinuses/Orbits: No acute finding. Other: None. IMPRESSION: Mild diffuse cortical atrophy. Mild chronic ischemic white matter disease. No acute intracranial abnormality seen. Electronically Signed   By: Lupita Raider M.D.   On: 10/07/2019 16:46   CT ABDOMEN PELVIS W CONTRAST  Result Date: 10/07/2019 CLINICAL DATA:  Nausea and vomiting. Possible UTI. Poor p.o. intake. EXAM: CT ABDOMEN AND PELVIS WITH CONTRAST TECHNIQUE: Multidetector CT imaging of the abdomen and pelvis was performed using the standard protocol following bolus administration of intravenous contrast. CONTRAST:  OMNIPAQUE IOHEXOL 300 MG/ML  SOLN COMPARISON:  September 19, 2018 FINDINGS: Lower chest: The lung bases are clear.The heart size is mildly enlarged. Coronary artery calcifications are noted. Hepatobiliary: The liver is normal. Cholelithiasis without acute inflammation.There is no biliary ductal dilation. Pancreas: The pancreatic duct is dilated which is stable since 2020. the pancreas is herniated into the thoracic cavity. Spleen: Unremarkable. Adrenals/Urinary Tract: --Adrenal glands:  Unremarkable. --Right kidney/ureter: No hydronephrosis or radiopaque kidney stones. --Left kidney/ureter: No hydronephrosis or radiopaque kidney stones. --Urinary bladder: The bladder is suboptimally evaluated secondary to extensive streak artifact through the patient's pelvis. The visualized portions are unremarkable. Stomach/Bowel: --Stomach/Duodenum: The majority of the stomach is herniated into the thoracic cavity as before. There is no definite evidence for an obstruction. --Small bowel: Unremarkable. --Colon: There is an above average amount of stool throughout the visualized portions of the colon. There are scattered colonic diverticula without CT evidence for diverticulitis. --Appendix: Normal. Vascular/Lymphatic: Atherosclerotic calcification is present within the non-aneurysmal abdominal aorta, without hemodynamically significant stenosis. --No retroperitoneal lymphadenopathy. --No mesenteric lymphadenopathy. --No pelvic or inguinal lymphadenopathy. Reproductive: Unremarkable Other: No ascites or free air. The abdominal wall is normal. Musculoskeletal. There is a compression fracture of the T12 vertebral body with approximately 60% height loss anteriorly. This was identified in May of 2021 and is essentially stable. There is no evidence for a new compression fracture. The patient is status post total hip  arthroplasty on the right. The hardware appears grossly intact. The patient is status post prior percutaneous pinning of the left hip. The hardware appears essentially stable from the patient's prior studies IMPRESSION: 1. No acute abnormality. 2. Again noted is a large hernia containing most of the patient's stomach and portions of the pancreas. The appearance is essentially unchanged since prior study. There is no definite evidence for high-grade obstruction. 3. Cholelithiasis without acute inflammation. Aortic Atherosclerosis (ICD10-I70.0). Electronically Signed   By: Katherine Mantle M.D.   On:  10/07/2019 16:52   DG Chest Portable 1 View  Result Date: 10/07/2019 CLINICAL DATA:  Altered mental status. EXAM: PORTABLE CHEST 1 VIEW COMPARISON:  June 21, 2019. FINDINGS: Stable cardiomediastinal silhouette. Stable large hiatal hernia is noted. No pneumothorax or pleural effusion is noted. Lungs are clear. Bony thorax is unremarkable. IMPRESSION: No active disease. Stable large hiatal hernia. Electronically Signed   By: Lupita Raider M.D.   On: 10/07/2019 16:33      IMPRESSION AND PLAN:   1.  Acute encephalopathy with confusion. -Differential diagnoses would include frontal CVA and worsening dementia that could be related to undercorrected  hypothyroidism.  Her UTI has resolved. -The patient will be admitted to a medical monitored bed. -We will follow neuro checks every 4 hours for 24 hours. -We will place her on aspirin and check fasting lipids in a.m. -We will obtain a brain MRI without contrast to rule out frontal CVA. -Her hypothyroidism will be managed. -We will check vitamin B12 and folate.    2.  Inadequately controlled hypothyroidism with significantly elevated TSH. -We will obtain a free T3 and free T4. -We will increase her Synthroid for now to 125 mcg p.o. daily.  3.  Hypokalemia. -We will replace potassium and check magnesium level.  4.  Dementia. -We will continue Aricept. -She may benefit from added Namenda.  5.  Depression.  -We will continue Lexapro, Seroquel and trazodone.  6.  GERD with large hiatal hernia. -We will continue PPI therapy as well as Carafate.  7.  DVT prophylaxis. -Subcutaneous Lovenox.    All the records are reviewed and case discussed with ED provider. The plan of care was discussed in details with the patient (and family). I answered all questions. The patient agreed to proceed with the above mentioned plan. Further management will depend upon hospital course.   CODE STATUS: The patient is DNR/DNI.  Status is:  Inpatient  Remains inpatient appropriate because:Altered mental status, Ongoing diagnostic testing needed not appropriate for outpatient work up, Unsafe d/c plan, IV treatments appropriate due to intensity of illness or inability to take PO and Inpatient level of care appropriate due to severity of illness   Dispo: The patient is from: SNF              Anticipated d/c is to: SNF              Anticipated d/c date is: 3 days              Patient currently is not medically stable to d/c.  TOTAL TIME TAKING CARE OF THIS PATIENT: 55 minutes.    Hannah Beat M.D on 10/07/2019 at 7:45 PM  [facility]   From 7 PM-7 AM, contact night-coverage [email or web address]  CC: Primary care physician; [facility]   Note: This dictation was prepared with Dragon dictation along with smaller phrase technology. Any transcriptional typo errors that result from this process are unintentional.

## 2019-10-08 LAB — BASIC METABOLIC PANEL
Anion gap: 8 (ref 5–15)
BUN: 12 mg/dL (ref 8–23)
CO2: 29 mmol/L (ref 22–32)
Calcium: 8.1 mg/dL — ABNORMAL LOW (ref 8.9–10.3)
Chloride: 102 mmol/L (ref 98–111)
Creatinine, Ser: 0.74 mg/dL (ref 0.44–1.00)
GFR calc Af Amer: >60 mL/min (ref >60)
GFR calc non Af Amer: >60 mL/min (ref >60)
Glucose, Bld: 108 mg/dL — ABNORMAL HIGH (ref 70–99)
Potassium: 2.8 mmol/L — ABNORMAL LOW (ref 3.5–5.1)
Sodium: 139 mmol/L (ref 135–145)

## 2019-10-08 LAB — LIPID PANEL
Cholesterol: 215 mg/dL — ABNORMAL HIGH (ref 0–200)
HDL: 54 mg/dL (ref >40)
LDL Cholesterol: 130 mg/dL — ABNORMAL HIGH (ref 0–99)
Total CHOL/HDL Ratio: 4 RATIO
Triglycerides: 153 mg/dL — ABNORMAL HIGH (ref <150)
VLDL: 31 mg/dL (ref 0–40)

## 2019-10-08 LAB — CBC
HCT: 33.4 % — ABNORMAL LOW (ref 36.0–46.0)
Hemoglobin: 11.1 g/dL — ABNORMAL LOW (ref 12.0–15.0)
MCH: 28.2 pg (ref 26.0–34.0)
MCHC: 33.2 g/dL (ref 30.0–36.0)
MCV: 84.8 fL (ref 80.0–100.0)
Platelets: 187 10*3/uL (ref 150–400)
RBC: 3.94 MIL/uL (ref 3.87–5.11)
RDW: 18.5 % — ABNORMAL HIGH (ref 11.5–15.5)
WBC: 3.7 10*3/uL — ABNORMAL LOW (ref 4.0–10.5)
nRBC: 0 % (ref 0.0–0.2)

## 2019-10-08 LAB — FOLATE: Folate: 46 ng/mL (ref >5.9)

## 2019-10-08 LAB — MAGNESIUM: Magnesium: 1.9 mg/dL (ref 1.7–2.4)

## 2019-10-08 LAB — POTASSIUM: Potassium: 4.1 mmol/L (ref 3.5–5.1)

## 2019-10-08 LAB — SARS CORONAVIRUS 2 BY RT PCR (HOSPITAL ORDER, PERFORMED IN ~~LOC~~ HOSPITAL LAB): SARS Coronavirus 2: NEGATIVE

## 2019-10-08 MED ORDER — POTASSIUM CHLORIDE 20 MEQ PO PACK
40.0000 meq | PACK | ORAL | Status: AC
  Administered 2019-10-08 (×2): 40 meq via ORAL
  Filled 2019-10-08 (×2): qty 2

## 2019-10-08 MED ORDER — HALOPERIDOL LACTATE 5 MG/ML IJ SOLN
2.0000 mg | Freq: Once | INTRAMUSCULAR | Status: AC
  Administered 2019-10-09: 2 mg via INTRAVENOUS
  Filled 2019-10-08: qty 1

## 2019-10-08 NOTE — TOC Initial Note (Signed)
Transition of Care [facility]) - Initial/Assessment Note    Patient Details  Name: KENLEY RETTINGER MRN: [id number] Date of Birth: Nov 21, 1928  [facility] CM/SW Contact:    Trenton Founds, RN Phone Number: 10/08/2019, 11:00 AM  Clinical Narrative:    RNCM spoke with patient at bedside, patient has 1:1 safety sitter, she was verbally responsive to my questions but was mostly talking about signs on the wall with wind warnings.   RNCM placed call to patient's daughter Verlon Au to complete assessment. Verlon Au reports that patient resides at [facility] and [facility] She reports that patient had already had signs of Dementia but when she went to rehab after a fall 1-2 months ago she had a drastic decline in her cognition. Verlon Au reports that they have already been in discussions with [facility] memory care and she is still looking into her options. Verlon Au reports that at this time it will be there plan for her mom to return to [facility] until they can decide next steps.   Verlon Au reports that her mom has reliable transportation to appointments and already has home services set up. They provide nursing, aide, PT/OT and handle all her medication administration. Her primary doctor is [employer] and she uses [facility] on [address].        Expected Discharge Plan: Home w Home Health Services Barriers to Discharge: Continued Medical Work up   Patient Goals and CMS Choice        Expected Discharge Plan and Services Expected Discharge Plan: Home w Home Health Services                                              Prior Living Arrangements/Services   Lives with:: Self Patient language and need for interpreter reviewed:: Yes Do you feel safe going back to the place where you live?: Yes      Need for Family Participation in Patient Care: Yes (Comment) Care giver support system in place?: Yes (comment)   Criminal  Activity/Legal Involvement Pertinent to Current Situation/Hospitalization: No - Comment as needed  Activities of Daily Living      Permission Sought/Granted                  Emotional Assessment Appearance:: Appears stated age     Orientation: : Oriented to Self      Admission diagnosis:  Acute encephalopathy [G93.40] Patient Active Problem List   Diagnosis Date Noted  . Acute encephalopathy 10/07/2019  . Closed left hip fracture (HCC) 06/21/2019  . Acute respiratory failure with hypoxia (HCC) 06/21/2019  . GERD (gastroesophageal reflux disease) 06/21/2019  . Depression with anxiety 06/21/2019  . Fall 06/21/2019  . Symptomatic bradycardia 11/05/2017  . Abnormal gait 12/17/2016  . Avascular necrosis of femoral head (HCC) 12/17/2016  . Bursitis of hip 12/17/2016  . CAD (coronary artery disease) 12/17/2016  . Closed fracture of intracapsular section of femur (HCC) 12/17/2016  . Closed transcervical fracture of femur (HCC) 12/17/2016  . Hip pain 12/17/2016  . Inflammation of sacroiliac joint (HCC) 12/17/2016  . Osteoarthritis of hip 12/17/2016  . Osteoarthritis of knee 12/17/2016  . Post-traumatic osteoarthritis 12/17/2016  . Diarrhea 09/20/2016  . Hiatal hernia 09/12/2016  . Acquired hypothyroidism 07/04/2016  . S/P total knee arthroplasty 08/08/2015  . Moderate mitral insufficiency 12/20/2014  .  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  . Hyperlipidemia, mixed 07/09/2013   PCP:  [facility]:   [id number] [place], [place] - [address] AT [facility] [address] [place] [address] Phone: [phone] Fax: [phone]  [facility] [place], [place] - [address] [place] [address] Phone: [phone] Fax: [phone]  [facility] - [place], [place] - [address]er [place] [address] Phone: [phone] Fax:  [phone]     Social Determinants of Health (SDOH) Interventions    Readmission Risk Interventions Readmission Risk Prevention Plan 10/08/2019  Transportation Screening Complete  PCP or Specialist Appt within 3-5 Days Complete  HRI or Home Care Consult Complete  Social Work Consult for Recovery Care Planning/Counseling Complete  Palliative Care Screening Not Applicable  Medication Review (RN Care Manager) Referral to Pharmacy  Some recent data might be hidden

## 2019-10-08 NOTE — Progress Notes (Signed)
PROGRESS NOTE    ADDIE ALONGE  WGN:[id number]  DOB: 17-Dec-1928  DOA: 10/07/2019 PCP: Almetta Lovely, [facility]:   Hospital course:  86 year old female with history of dementia, depression, HTN, HL and hypothyroidism was admitted 10/07/2019 with altered mental status.  Patient had been coming to the ED as sent by her independent living facility for confusion and was initially thought to have a UTI which was treated with fosfomycin.  However she has had decreased p.o. intake and when she was admitted was noted to be in acute renal failure with hyponatremia and hypokalemia.  UA was negative at the time.  Further work-up included ammonia level of 10 and unremarkable CBC.  TSH came back elevated at 67.  Portable chest ray showed stable large hiatal hernia no acute cardiopulmonary disease.  Abdominal and pelvic CT scan showed cholelithiasis without cholecystitis, large hiatal hernia containing most of the patient's stomach and portions of the pancreas with unchanged appearance since prior study and no evidence for high-grade obstruction with aortic atherosclerosis and no other acute abnormalities.  Head CT scan revealed mild diffuse cortical atrophy and mild chronic ischemic white matter disease with no acute intracranial abnormality seen.   Subjective:  Patient's dates that she feels fine.  She admits that she is somewhat confused.  She does note that she is in a "restorative facility in [facility]".  She does not remember being confused.  She does admit that she has decreased p.o. intake.  She is not quite sure why she was here other than "I have been sick".  The rest of history was provided by patient's neighbor and friend Mrs. Earl Gala as well as with conversation with her daughter over the phone.  Patient apparently had had to leave her home due to worsening memory problems however over the past 3 months has had intermittent confusion and delirium which  was onset with her hip fracture however which has never really broken.  Mrs. Iona Beard who visits her daily notes that mostly she was asleep and not eating very much when she came to see her over the past couple of weeks.   Objective: Vitals:   10/07/19 2322 10/08/19 0020 10/08/19 0755 10/08/19 1205  BP: 140/79 (!) 182/79 132/62 (!) 156/66  Pulse: 64 66 (!) 55 61  Resp: Temp: (!) 97.5 F (36.4 C) 98.7 F (37.1 C) 98.1 F (36.7 C) 98.1 F (36.7 C)  TempSrc: Oral Oral Oral Oral  SpO2: 96% 96% 96% 93%  Weight:  59.9 kg    Height:   (1.676 m)      Intake/Output Summary (Last 24 hours) at 10/08/2019 1453 Last data filed at 10/08/2019 0900 Gross per 24 hour  Intake 213.57 ml  Output --  Net 213.57 ml   Filed Weights   10/07/19 1126 10/08/19 0020  Weight: 63.5 kg 59.9 kg     Exam:  General: Bright alert female sitting up in bed speaking forcefully with me. Eyes: sclera anicteric, conjuctiva mild injection bilaterally CVS: S1-S2, regular  Respiratory:  decreased air entry bilaterally secondary to decreased inspiratory effort, rales at bases  GI: NABS, soft, NT  LE: No edema.  Neuro: A/O x 2, Moving all extremities equally with normal strength, CN 3-12 intact, grossly nonfocal.  Psych: Patient speaks coherently however clearly has memory deficits.  Assessment & Plan:   Acute encephalopathy with confusion/delirium Sounds like patient has had delirium on and off for the past 3 months since  her hip fracture Her UTI seems resolved with fosfomycin Clearly patient is not eating and drinking well at home She is responded well to IV fluid resuscitation and is now awake and alert and speaking forcefully Discussed with patient's daughter that she will need more supervision than she is getting to make sure she eats.  She stated that she will speak with [facility] about having somebody go up there and make sure her mom is eating.  Hypokalemia Potassium has dropped to  2.8 despite supplementation, will request pharmacy assistance with repletion  Hypothyroidism TSH markedly elevated, Synthroid was increased to 125 However this may well be sick euthyroid syndrome Free T3 and T4 are pending  Dementia Continue Aricept per home doses  Depression Continue Lexapro, Seroquel and trazodone per home doses  GERD with large hiatal hernia Continue PPI and Carafate per home doses   DVT prophylaxis: Lovenox Code Status: DNR Family Communication: Spoke with patient's daughter [place] Disposition Plan:   Patient is from: [facility]  Anticipated Discharge Location: Probably [facility]  Barriers to Discharge: Persistent hypokalemia  Is patient medically stable for Discharge: Not yet   Consultants:  None  Procedures:  None  Antimicrobials:  None   Data Reviewed:  Basic Metabolic Panel: Recent Labs  Lab 10/04/19 1216 10/07/19 1125 10/07/19 2115 10/08/19 0413  NA 138 138  --  139  K 3.5 3.1*  --  2.8*  CL 99 100  --  102  CO2 28 29  --  29  GLUCOSE 109* 104*  --  108*  BUN 14 15  --  12  CREATININE 1.31* 0.96  --  0.74  CALCIUM 8.9 8.6*  --  8.1*  MG  --   --  1.9  --    Liver Function Tests: Recent Labs  Lab 10/04/19 1216 10/07/19 1125  AST 27 21  ALT 11 11  ALKPHOS 58 55  BILITOT 0.7 0.7  PROT 6.7 6.3*  ALBUMIN 3.3* 3.1*   Recent Labs  Lab 10/07/19 1125  LIPASE 47   Recent Labs  Lab 10/07/19 1549  AMMONIA 10   CBC: Recent Labs  Lab 10/04/19 1216 10/07/19 1125 10/08/19 0413  WBC 5.6 4.6  4.7 3.7*  NEUTROABS  --  2.6  --   HGB 14.3 12.5  12.7 11.1*  HCT 43.9 38.7  39.1 33.4*  MCV 85.2 86.0  85.9 84.8  PLT 250 238  215 187   Cardiac Enzymes: No results for input(s): CKTOTAL, CKMB, CKMBINDEX, TROPONINI in the last 168 hours. BNP (last 3 results) No results for input(s): PROBNP in the last 8760 hours. CBG: No results for input(s): GLUCAP in the last 168 hours.  Recent Results (from the  past 240 hour(s))  Urine Culture     Status: None   Collection Time: 10/04/19 12:23 PM   Specimen: Urine, Catheterized  Result Value Ref Range Status   Specimen Description   Final    URINE, CATHETERIZED Performed at [facility], [address], [place], [place] [address]    Special Requests   Final    NONE Performed at [facility], [address], [place], [place] [address]    Culture   Final    NO GROWTH Performed at [facility], [address] [place], [place] [address]    Report Status 10/05/2019 FINAL  Final  SARS Coronavirus 2 by RT PCR (hospital order, performed in [facility] hospital lab) Nasopharyngeal Nasopharyngeal Swab  Status: None   Collection Time: 10/08/19 12:12 AM   Specimen: Nasopharyngeal Swab  Result Value Ref Range Status   SARS Coronavirus 2 NEGATIVE NEGATIVE Final    Comment: (NOTE) SARS-CoV-2 target nucleic acids are NOT DETECTED.  The SARS-CoV-2 RNA is generally detectable in upper and lower respiratory specimens during the acute phase of infection. The lowest concentration of SARS-CoV-2 viral copies this assay can detect is 250 copies / mL. A negative result does not preclude SARS-CoV-2 infection and should not be used as the sole basis for treatment or other patient management decisions.  A negative result may occur with improper specimen collection / handling, submission of specimen other than nasopharyngeal swab, presence of viral mutation(s) within the areas targeted by this assay, and inadequate number of viral copies (<250 copies / mL). A negative result must be combined with clinical observations, patient history, and epidemiological information.  Fact Sheet for Patients:   BoilerBrush.com.cy  Fact Sheet for Healthcare Providers: https://pope.com/  This test is not yet approved or  cleared by the [place] FDA and has been authorized for detection and/or  diagnosis of SARS-CoV-2 by FDA under an Emergency Use Authorization (EUA).  This EUA will remain in effect (meaning this test can be used) for the duration of the COVID-19 declaration under Section 564(b)(1) of the Act, 21 U.S.C. section 360bbb-3(b)(1), unless the authorization is terminated or revoked sooner.  Performed at [facility], [address], [place], [place] [address]       Studies: CT Head Wo Contrast  Result Date: 10/07/2019 CLINICAL DATA:  Head injury after fall 3 days ago. EXAM: CT HEAD WITHOUT CONTRAST TECHNIQUE: Contiguous axial images were obtained from the base of the skull through the vertex without intravenous contrast. COMPARISON:  October 04, 2019. FINDINGS: Brain: Mild diffuse cortical atrophy is noted. Mild chronic ischemic white matter disease is noted. No mass effect or midline shift is noted. Ventricular size is within normal limits. There is no evidence of mass lesion, hemorrhage or acute infarction. Vascular: No hyperdense vessel or unexpected calcification. Skull: Normal. Negative for fracture or focal lesion. Sinuses/Orbits: No acute finding. Other: None. IMPRESSION: Mild diffuse cortical atrophy. Mild chronic ischemic white matter disease. No acute intracranial abnormality seen. Electronically Signed   By: Lupita Raider M.D.   On: 10/07/2019 16:46   MR BRAIN WO CONTRAST  Result Date: 10/07/2019 CLINICAL DATA:  Initial evaluation for acute encephalopathy. EXAM: MRI HEAD WITHOUT CONTRAST TECHNIQUE: Multiplanar, multiecho pulse sequences of the brain and surrounding structures were obtained without intravenous contrast. COMPARISON:  Comparison made with prior CT from earlier the same day. FINDINGS: Brain: Moderately advanced age-related cerebral atrophy with chronic small vessel ischemic disease. No abnormal foci of restricted diffusion to suggest acute or subacute ischemia. Gray-white matter differentiation maintained. No encephalomalacia to suggest  chronic cortical infarction. No acute intracranial hemorrhage. Few scattered punctate chronic micro hemorrhages noted, likely due to underlying hypertension and/or small vessel disease. No mass lesion, midline shift or mass effect. No hydrocephalus or extra-axial fluid collection. Pituitary gland suprasellar region within normal limits. Midline structures intact. Vascular: Major intracranial vascular flow voids are maintained. Skull and upper cervical spine: Craniocervical junction within normal limits. Bone marrow signal intensity normal. No scalp soft tissue abnormality. Sinuses/Orbits: Patient status post bilateral ocular lens replacement. Globes and orbital soft tissues demonstrate no acute finding. Left maxillary sinus retention cyst noted. Paranasal sinuses are otherwise clear. No significant mastoid effusion. Inner ear structures grossly normal. Other: None. IMPRESSION: 1.  No acute intracranial abnormality. 2. Moderately advanced age-related cerebral atrophy with chronic small vessel ischemic disease. Electronically Signed   By: Rise MuBenjamin  McClintock M.D.   On: 10/07/2019 22:44   CT ABDOMEN PELVIS W CONTRAST  Result Date: 10/07/2019 CLINICAL DATA:  Nausea and vomiting. Possible UTI. Poor p.o. intake. EXAM: CT ABDOMEN AND PELVIS WITH CONTRAST TECHNIQUE: Multidetector CT imaging of the abdomen and pelvis was performed using the standard protocol following bolus administration of intravenous contrast. CONTRAST:  100mL OMNIPAQUE IOHEXOL 300 MG/ML  SOLN COMPARISON:  September 19, 2018 FINDINGS: Lower chest: The lung bases are clear.The heart size is mildly enlarged. Coronary artery calcifications are noted. Hepatobiliary: The liver is normal. Cholelithiasis without acute inflammation.There is no biliary ductal dilation. Pancreas: The pancreatic duct is dilated which is stable since 2020. the pancreas is herniated into the thoracic cavity. Spleen: Unremarkable. Adrenals/Urinary Tract: --Adrenal glands: Unremarkable.  --Right kidney/ureter: No hydronephrosis or radiopaque kidney stones. --Left kidney/ureter: No hydronephrosis or radiopaque kidney stones. --Urinary bladder: The bladder is suboptimally evaluated secondary to extensive streak artifact through the patient's pelvis. The visualized portions are unremarkable. Stomach/Bowel: --Stomach/Duodenum: The majority of the stomach is herniated into the thoracic cavity as before. There is no definite evidence for an obstruction. --Small bowel: Unremarkable. --Colon: There is an above average amount of stool throughout the visualized portions of the colon. There are scattered colonic diverticula without CT evidence for diverticulitis. --Appendix: Normal. Vascular/Lymphatic: Atherosclerotic calcification is present within the non-aneurysmal abdominal aorta, without hemodynamically significant stenosis. --No retroperitoneal lymphadenopathy. --No mesenteric lymphadenopathy. --No pelvic or inguinal lymphadenopathy. Reproductive: Unremarkable Other: No ascites or free air. The abdominal wall is normal. Musculoskeletal. There is a compression fracture of the T12 vertebral body with approximately 60% height loss anteriorly. This was identified in May of 2021 and is essentially stable. There is no evidence for a new compression fracture. The patient is status post total hip arthroplasty on the right. The hardware appears grossly intact. The patient is status post prior percutaneous pinning of the left hip. The hardware appears essentially stable from the patient's prior studies IMPRESSION: 1. No acute abnormality. 2. Again noted is a large hernia containing most of the patient's stomach and portions of the pancreas. The appearance is essentially unchanged since prior study. There is no definite evidence for high-grade obstruction. 3. Cholelithiasis without acute inflammation. Aortic Atherosclerosis (ICD10-I70.0). Electronically Signed   By: Katherine Mantlehristopher  Green M.D.   On: 10/07/2019 16:52    DG Chest Portable 1 View  Result Date: 10/07/2019 CLINICAL DATA:  Altered mental status. EXAM: PORTABLE CHEST 1 VIEW COMPARISON:  June 21, 2019. FINDINGS: Stable cardiomediastinal silhouette. Stable large hiatal hernia is noted. No pneumothorax or pleural effusion is noted. Lungs are clear. Bony thorax is unremarkable. IMPRESSION: No active disease. Stable large hiatal hernia. Electronically Signed   By: Lupita RaiderJames  Green Jr M.D.   On: 10/07/2019 16:33     Scheduled Meds: . aspirin EC  81 mg Oral Daily  . donepezil  10 mg Oral QHS  . enoxaparin (LOVENOX) injection  40 mg Subcutaneous Q24H  . escitalopram  5 mg Oral Daily  . levothyroxine  125 mcg Oral q morning - 10a  . metoprolol succinate  25 mg Oral QHS  . multivitamin with minerals  1 tablet Oral Daily  . pantoprazole  40 mg Oral Daily  . QUEtiapine  50 mg Oral QHS  . sucralfate  1 g Oral QID  . traZODone  100 mg Oral QHS   Continuous Infusions: .  sodium chloride 100 mL/hr at 10/08/19 8850    Active Problems:   Acute encephalopathy     Pieter Partridge, [facility]  If 7PM-7AM, please contact night-coverage [email or web address] Password [email or web address] 10/08/2019, 2:53 PM    LOS: 1 day

## 2019-10-08 NOTE — Consult Note (Signed)
PHARMACY CONSULT NOTE - FOLLOW UP  Pharmacy Consult for Electrolyte Monitoring and Replacement   Recent Labs: Potassium (mmol/L)  Date Value  10/08/2019 2.8 (L)  08/21/2013 4.0   Magnesium (mg/dL)  Date Value  40/76/8088 1.9   Calcium (mg/dL)  Date Value  02/09/1593 8.1 (L)   Calcium, Total (mg/dL)  Date Value  58/59/2924 8.9   Albumin (g/dL)  Date Value  46/28/6381 3.1 (L)  08/19/2013 2.8 (L)   Sodium (mmol/L)  Date Value  10/08/2019 139  08/21/2013 137     Assessment: Pharmacy consulted to manage electrolytes in this 86 year old female admitted with encephalopathy and AMS.  Patient reported to having poor dietary intake lately, but without nausea, vomiting, or diarrhea.  Goal of Therapy:  Electrolytes WNL  Plan:  Electrolytes likely depleted due to inadequate nutrition.  KCl 40 mEq x 2 doses ordered.  Will follow up electrolytes with AM labs.  Cherly Hensen ,PharmD Clinical Pharmacist 10/08/2019 8:09 AM

## 2019-10-08 NOTE — Evaluation (Signed)
Physical Therapy Evaluation Patient Details Name: Kerry Cabrera MRN: [id number] DOB: December 04, 1928 Today's Date: 10/08/2019   History of Present Illness  Per MD note: pt is 86 y/o female with a known history of hip fracture fixation, dementia, depression, hypertension, dyslipidemia and hypothyroidism, who presented to the emergency room with acute onset of recurrent altered mental status with confusion; admitted for management of acute metabolic encephalopathy  Clinical Impression  Patient visiting with friend at bedside upon arrival to room.  Acknowledges and interacts with therapist; pleasant and cooperative. Oriented only to self, but does follow simple commands throughout session.  Bilat UE/LE strength and ROM grossly symmetrical and WFL; mild residual weakness in L hip (mostly noted with closed chain activities), likely related to previous hip injury/repair.  Currently requiring min assist for bed mobility; min assist for sit/stand, basic transfers and gait (80') with RW.  Demonstrates reciprocal stepping pattern; noted weakness in L posterior/lateral hip musculature.  Intermittent assist for walker position, management and advancement; highly distractible by external environment.  Recommend continued use of RW and +1 at all times.  Would benefit from skilled PT to address above deficits and promote optimal return to PLOF.; Recommend transition to HHPT upon discharge from acute hospitalization.  Feel patient to benefit optimally from return to familiar environment with appropriate support services in place.     Follow Up Recommendations Home health PT    Equipment Recommendations       Recommendations for Other Services       Precautions / Restrictions Precautions Precautions: Fall Restrictions Weight Bearing Restrictions: No Other Position/Activity Restrictions: per orders, pt requires a 1:1 sitter      Mobility  Bed Mobility Overal bed mobility: Needs Assistance Bed Mobility:  Supine to Sit     Supine to sit: Min assist     General bed mobility comments: observed pt climbing out of bed following session without physical assist, RN notified immediately  Transfers Overall transfer level: Needs assistance Equipment used: Rolling walker (2 wheeled) Transfers: Sit to/from Stand Sit to Stand: Min guard;Min assist         General transfer comment: cuing for hand placement, tends to pull on Rw  Ambulation/Gait Ambulation/Gait assistance: Min guard;Min assist Gait Distance (Feet): 80 Feet Assistive device: Rolling walker (2 wheeled)       General Gait Details: reciprocal stepping pattern; noted weakness in L posterior/lateral hip musculature.  Intermittent assist for walker position, management and advancement; highly distractible by external environment  Stairs            Wheelchair Mobility    Modified Rankin (Stroke Patients Only)       Balance Overall balance assessment: Needs assistance Sitting-balance support: No upper extremity supported;Feet supported Sitting balance-Leahy Scale: Good     Standing balance support: Bilateral upper extremity supported Standing balance-Leahy Scale: Fair                               Pertinent Vitals/Pain Pain Assessment: No/denies pain    Home Living Family/patient expects to be discharged to:: Assisted living               Home Equipment: Walker - 4 wheels Additional Comments: Per chart review, pt lives at [facility].  Visitor (friend in room) verifies.  Recently completed stent at rehab after hip fracture and repair (06/2019), but had returned to ALF with [facility] services.    Prior Function Level of  Independence: Needs assistance         Comments: Per chart, prior to hip fracture/repair in March, indep ambulatory without assist device; has been using RW since March.  Visitor at bedside verifies     Hand Dominance        Extremity/Trunk Assessment    Upper Extremity Assessment Upper Extremity Assessment: Overall WFL for tasks assessed    Lower Extremity Assessment Lower Extremity Assessment: Overall WFL for tasks assessed (grossly at least 4-/5 throughout)       Communication   Communication: No difficulties  Cognition Arousal/Alertness: Awake/alert Behavior During Therapy: WFL for tasks assessed/performed Overall Cognitive Status: No family/caregiver present to determine baseline cognitive functioning                                 General Comments: Oriented to self; unaware of location, situation.  Follows simple commands, pleasant and cooperative      General Comments      Exercises Other Exercises Other Exercises: Educated in safety with transfers, gait training; min cuing for walker use and management.  Limited recall of new information; will continue to reinforce as appropriate to maximize carryover   Assessment/Plan    PT Assessment Patient needs continued PT services  PT Problem List Decreased activity tolerance;Decreased balance;Decreased mobility;Decreased cognition;Decreased knowledge of use of DME       PT Treatment Interventions DME instruction;Gait training;Functional mobility training;Therapeutic activities;Therapeutic exercise;Balance training;Cognitive remediation;Patient/family education    PT Goals (Current goals can be found in the Care Plan section)  Acute Rehab PT Goals Patient Stated Goal: Pt would like to return home PT Goal Formulation: With patient Time For Goal Achievement: 10/22/19 Potential to Achieve Goals: Fair    Frequency Min 2X/week   Barriers to discharge        Co-evaluation               AM-PAC PT "6 Clicks" Mobility  Outcome Measure Help needed turning from your back to your side while in a flat bed without using bedrails?: A Little Help needed moving from lying on your back to sitting on the side of a flat bed without using bedrails?: A Little Help  needed moving to and from a bed to a chair (including a wheelchair)?: A Little Help needed standing up from a chair using your arms (e.g., wheelchair or bedside chair)?: A Little Help needed to walk in hospital room?: A Little Help needed climbing 3-5 steps with a railing? : A Little 6 Click Score: 18    End of Session Equipment Utilized During Treatment: Gait belt Activity Tolerance: Patient tolerated treatment well Patient left: in bed;with call bell/phone within reach;with bed alarm set (sitter at bedside)   PT Visit Diagnosis: Muscle weakness (generalized) (M62.81);Difficulty in walking, not elsewhere classified (R26.2)    Time: 3662-9476 PT Time Calculation (min) (ACUTE ONLY): 23 min   Charges:   PT Evaluation $PT Eval Moderate Complexity: 1 Mod PT Treatments $Therapeutic Activity: 8-22 mins        Jemuel Laursen H. Manson Passey, PT, DPT, NCS 10/08/19, 5:27 PM [phone]

## 2019-10-08 NOTE — Evaluation (Signed)
Occupational Therapy Evaluation Patient Details Name: Kerry Cabrera MRN: [id number] DOB: 03/16/1929 Today's Date: 10/08/2019    History of Present Illness Per MD note: pt is 86 y/o female with a known history of hip fracture fixation, dementia, depression, hypertension, dyslipidemia and hypothyroidism, who presented to the emergency room with acute onset of recurrent altered mental status with confusion.   Clinical Impression   Pt presents with NT present, pleasant in affect and conversation. Pt has a history of dementia and is a poor historian, with no family or caregiver to provide further information on pt's prior level or confirm accuracy of information. Pt is oriented to self, however she stated that the month was "October," "it is a Saturday" and that "we are in [place] at the [employer]." Per note from [facility] in chart review (7/01), pt's daughter Kerry Cabrera reports "patient resides at [facility] and is [facility] receiving [facility] She reports that patient had already had signs of Dementia but when she went to rehab after a fall 1-2 months ago she had a drastic decline in her cognition.Marland KitchenMarland Kitchen[she] already has home services set up. They provide nursing, aide, PT/OT and handle all her medication administration." During session, pt demonstrates good AROM of BUE, but declines any further strength testing, movement, functional mobility or ADL. She states, "I am tired, I need to rest." Pt will benefit from skilled acute OT services while in house to maximize pt participation in ADL and safety adherence in order to decrease caregiver burden. Recommend HHOT upon discharge with initial 24 hour Supervision for safety/assistance if needed.      Follow Up Recommendations  Home health OT;Supervision/Assistance - 24 hour    Equipment Recommendations  None recommended by OT    Recommendations for Other Services       Precautions / Restrictions Precautions Precautions:  Fall Restrictions Weight Bearing Restrictions: No Other Position/Activity Restrictions: per orders, pt requires a 1:1 sitter      Mobility Bed Mobility Overal bed mobility: Modified Independent             General bed mobility comments: observed pt climbing out of bed following session without physical assist, RN notified immediately  Transfers                 General transfer comment: pt declined    Balance Overall balance assessment: History of Falls (not observed, pt declined functional mobility)                                         ADL either performed or assessed with clinical judgement   ADL Overall ADL's : Needs assistance/impaired                                       General ADL Comments: Pt declined any participation in ADL or functional mobility during session, saying "I am tired. I need to rest."     Vision         Perception     Praxis      Pertinent Vitals/Pain Pain Assessment: No/denies pain     Hand Dominance     Extremity/Trunk Assessment Upper Extremity Assessment Upper Extremity Assessment: Generalized weakness (pt declined MMT, full ROM in BUE)   Lower Extremity Assessment Lower Extremity Assessment: Difficult to assess due  to impaired cognition (pt declined)       Communication Communication Communication: No difficulties   Cognition Arousal/Alertness: Awake/alert Behavior During Therapy: WFL for tasks assessed/performed Overall Cognitive Status: No family/caregiver present to determine baseline cognitive functioning                                 General Comments: Pt has history of dementia, no family or caregiver present to determine baseline. Pt is oriented to self, but is overall disoriented to place, situation and generally poor historian. She states "[place] in [facility] "it is Saturday," "the month is October." She follows 1-2 step  commands.   General Comments       Exercises     Shoulder Instructions      Home Living Family/patient expects to be discharged to:: Assisted living                             Home Equipment: Walker - 4 wheels   Additional Comments: Per chart review, pt lives at [facility] Pt unable to verify.      Prior Functioning/Environment Level of Independence: Needs assistance        Comments: Pt is unreliable historian, however says that she uses a RW for ambulation and needed assistance for ADL "as of late." Per chart (CM note), pt has services set up that provide nursing, aide, PT/OT and handle all her medication administration.        OT Problem List: Decreased cognition;Decreased safety awareness;Decreased knowledge of use of DME or AE;Decreased strength      OT Treatment/Interventions: Self-care/ADL training;Therapeutic exercise;Therapeutic activities;Cognitive remediation/compensation;DME and/or AE instruction;Patient/family education    OT Goals(Current goals can be found in the care plan section) Acute Rehab OT Goals Patient Stated Goal: Pt would like to return home OT Goal Formulation: With patient Time For Goal Achievement: 10/22/19 Potential to Achieve Goals: Good ADL Goals Pt Will Perform Grooming: with supervision;standing;with adaptive equipment (following verbal cues as needed) Pt Will Perform Upper Body Dressing: with supervision;with adaptive equipment;sitting (with verbal cues for sequencing) Pt Will Transfer to Toilet: with supervision;ambulating;regular height toilet (using LRAD safely)  OT Frequency: Min 1X/week   Barriers to D/C:            Co-evaluation              AM-PAC OT "6 Clicks" Daily Activity     Outcome Measure Help from another person eating meals?: A Little Help from another person taking care of personal grooming?: A Little Help from another person toileting, which includes using toliet, bedpan,  or urinal?: A Little Help from another person bathing (including washing, rinsing, drying)?: A Little Help from another person to put on and taking off regular upper body clothing?: A Little Help from another person to put on and taking off regular lower body clothing?: A Little 6 Click Score: 18   End of Session Nurse Communication: Mobility status (Pt seen getting OOB following session, no staff present)  Activity Tolerance: Patient limited by fatigue Patient left: in bed;with call bell/phone within reach  OT Visit Diagnosis: Unsteadiness on feet (R26.81);History of falling (Z91.81);Muscle weakness (generalized) (M62.81);Other symptoms and signs involving cognitive function                Time: 5573-2202 OT Time Calculation (min): 11 min Charges:  OT General  Charges $OT Visit: 1 Visit OT Evaluation $OT Eval Moderate Complexity: 1 Mod  Maurilio Lovely, OTS 10/08/19, 4:50 PM

## 2019-10-08 NOTE — Plan of Care (Signed)
  Problem: Education: Goal: Knowledge of General Education information will improve Description: Including pain rating scale, medication(s)/side effects and non-pharmacologic comfort measures 10/08/2019 0134 by Samella Parr, RN Outcome: Progressing 10/08/2019 0134 by Samella Parr, RN Outcome: Progressing   Problem: Health Behavior/Discharge Planning: Goal: Ability to manage health-related needs will improve 10/08/2019 0134 by Samella Parr, RN Outcome: Progressing 10/08/2019 0134 by Samella Parr, RN Outcome: Progressing

## 2019-10-09 LAB — BASIC METABOLIC PANEL
Anion gap: 8 (ref 5–15)
BUN: 8 mg/dL (ref 8–23)
CO2: 29 mmol/L (ref 22–32)
Calcium: 8.6 mg/dL — ABNORMAL LOW (ref 8.9–10.3)
Chloride: 103 mmol/L (ref 98–111)
Creatinine, Ser: 0.67 mg/dL (ref 0.44–1.00)
GFR calc Af Amer: >60 mL/min (ref >60)
GFR calc non Af Amer: >60 mL/min (ref >60)
Glucose, Bld: 86 mg/dL (ref 70–99)
Potassium: 4.7 mmol/L (ref 3.5–5.1)
Sodium: 140 mmol/L (ref 135–145)

## 2019-10-09 LAB — PHOSPHORUS: Phosphorus: 2.1 mg/dL — ABNORMAL LOW (ref 2.5–4.6)

## 2019-10-09 LAB — FOLATE RBC
Folate, Hemolysate: 467 ng/mL
Folate, RBC: 1262 ng/mL (ref >498)
Hematocrit: 37 % (ref 34.0–46.6)

## 2019-10-09 LAB — CBC
HCT: 33.7 % — ABNORMAL LOW (ref 36.0–46.0)
Hemoglobin: 11.2 g/dL — ABNORMAL LOW (ref 12.0–15.0)
MCH: 28.1 pg (ref 26.0–34.0)
MCHC: 33.2 g/dL (ref 30.0–36.0)
MCV: 84.7 fL (ref 80.0–100.0)
Platelets: 195 10*3/uL (ref 150–400)
RBC: 3.98 MIL/uL (ref 3.87–5.11)
RDW: 18.9 % — ABNORMAL HIGH (ref 11.5–15.5)
WBC: 3.7 10*3/uL — ABNORMAL LOW (ref 4.0–10.5)
nRBC: 0 % (ref 0.0–0.2)

## 2019-10-09 LAB — T3, FREE: T3, Free: 0.8 pg/mL — ABNORMAL LOW (ref 2.0–4.4)

## 2019-10-09 LAB — MAGNESIUM: Magnesium: 2 mg/dL (ref 1.7–2.4)

## 2019-10-09 MED ORDER — K PHOS MONO-SOD PHOS DI & MONO 155-852-130 MG PO TABS
500.0000 mg | ORAL_TABLET | ORAL | Status: AC
  Administered 2019-10-09: 21:00:00 500 mg via ORAL
  Filled 2019-10-09 (×4): qty 2

## 2019-10-09 MED ORDER — HALOPERIDOL LACTATE 5 MG/ML IJ SOLN
INTRAMUSCULAR | Status: AC
  Administered 2019-10-09: 02:00:00 5 mg via INTRAVENOUS
  Filled 2019-10-09: qty 1

## 2019-10-09 MED ORDER — HALOPERIDOL LACTATE 5 MG/ML IJ SOLN: 5.0000 mg | Freq: Once | INTRAMUSCULAR | Status: AC

## 2019-10-09 NOTE — Care Management Important Message (Signed)
Important Message  Patient Details  Name: Kerry Cabrera MRN: [id number] Date of Birth: 06-Nov-1928   Medicare Important Message Given:  N/A - LOS <3 / Initial given by admissions     Olegario Messier A Armanii Pressnell 10/09/2019, 3:00 PM

## 2019-10-09 NOTE — Consult Note (Signed)
PHARMACY CONSULT NOTE - FOLLOW UP  Pharmacy Consult for Electrolyte Monitoring and Replacement   Recent Labs: Potassium (mmol/L)  Date Value  10/09/2019 4.7  08/21/2013 4.0   Magnesium (mg/dL)  Date Value  68/03/7516 2.0   Calcium (mg/dL)  Date Value  00/17/4944 8.6 (L)   Calcium, Total (mg/dL)  Date Value  96/75/9163 8.9   Albumin (g/dL)  Date Value  84/66/5993 3.1 (L)  08/19/2013 2.8 (L)   Phosphorus (mg/dL)  Date Value  57/04/7791 2.1 (L)   Sodium (mmol/L)  Date Value  10/09/2019 140  08/21/2013 137     Assessment: Pharmacy consulted to manage electrolytes in this 86 year old female admitted with encephalopathy and AMS.  Patient reported to having poor dietary intake lately, but without nausea, vomiting, or diarrhea.  Goal of Therapy:  Electrolytes WNL  Plan:  7/2 K 4.7 Mag 2.0 Phos 2.1 Scr 0.67 Will order KPhos neutral tab 2 tabs q4h PO x 4 doses  Will follow up electrolytes with AM labs.  Angelique Blonder ,PharmD Clinical Pharmacist 10/09/2019 8:11 AM

## 2019-10-09 NOTE — TOC Progression Note (Signed)
Transition of Care [facility]) - Progression Note    Patient Details  Name: Kerry Cabrera MRN: [id number] Date of Birth: 22-Jun-1928  Transition of Care [facility]) CM/SW Contact  Allayne Butcher, RN Phone Number: 10/09/2019, 11:21 AM  Clinical Narrative:    Patient will be ready for discharge back to [facility] tomorrow.  Daughter is in [place] but will be back tomorrow afternoon.  Patient also has a son that will be back in town tomorrow as well.  Patient will have 24 hour care when she goes back to [facility] that family will have arranged by tomorrow.  A family member will be coming to pick the patient up either the son or friend [place].     Expected Discharge Plan: Home w Home Health Services Barriers to Discharge: Continued Medical Work up  Expected Discharge Plan and Services Expected Discharge Plan: Home w Home Health Services                                               Social Determinants of Health (SDOH) Interventions    Readmission Risk Interventions Readmission Risk Prevention Plan 10/08/2019  Transportation Screening Complete  PCP or Specialist Appt within 3-5 Days Complete  HRI or Home Care Consult Complete  Social Work Consult for Recovery Care Planning/Counseling Complete  Palliative Care Screening Not Applicable  Medication Review Oceanographer) Referral to Pharmacy  Some recent data might be hidden

## 2019-10-09 NOTE — Progress Notes (Signed)
PROGRESS NOTE    Kerry Cabrera  WFU:[id number]  DOB: 02-27-1929  DOA: 10/07/2019 PCP: Almetta Lovely, [facility]:   Hospital course:  86 year old female with history of dementia, depression, HTN, HL and hypothyroidism was admitted 10/07/2019 with altered mental status.  Patient had been coming to the ED as sent by her independent living facility for confusion and was initially thought to have a UTI which was treated with fosfomycin.  However she has had decreased p.o. intake and when she was admitted was noted to be in acute renal failure with hyponatremia and hypokalemia.  UA was negative at the time.  Further work-up included ammonia level of 10 and unremarkable CBC.  TSH came back elevated at 67.  Portable chest ray showed stable large hiatal hernia no acute cardiopulmonary disease.  Abdominal and pelvic CT scan showed cholelithiasis without cholecystitis, large hiatal hernia containing most of the patient's stomach and portions of the pancreas with unchanged appearance since prior study and no evidence for high-grade obstruction with aortic atherosclerosis and no other acute abnormalities.  Head CT scan revealed mild diffuse cortical atrophy and mild chronic ischemic white matter disease with no acute intracranial abnormality seen.   Subjective:  Patient looks at me blankly when I introduced myself to her.  She states she does not remember speaking with me yesterday.  She notes she would like to go home.  She is not sure why she is here.  She thinks her appetite is improved but is not sure.  She states she is enjoying her oatmeal but does not like the coffee here.  Objective: Vitals:   10/09/19 0720 10/09/19 0800 10/09/19 1157 10/09/19 1523  BP: (!) 143/101 (!) 143/69 (!) 138/56 (!) 155/77  Pulse: 64  65 69  Resp: 18  17 17   Temp:   98.7 F (37.1 C) 98.5 F (36.9 C)  TempSrc:   Oral Oral  SpO2: 99%  93% 95%  Weight:      Height:        Intake/Output  Summary (Last 24 hours) at 10/09/2019 1527 Last data filed at 10/09/2019 1055 Gross per 24 hour  Intake 120 ml  Output --  Net 120 ml   Filed Weights   10/07/19 1126 10/08/19 0020  Weight: 63.5 kg 59.9 kg     Exam:  General: Patient sitting up in bed eating breakfast by herself, she is feeding herself.  She has a 12/09/19 at bedside who noted that she was apparently trying to get out of bed last night. Eyes: sclera anicteric, conjuctiva mild injection bilaterally CVS: S1-S2, regular  Respiratory:  decreased air entry bilaterally secondary to decreased inspiratory effort, rales at bases  GI: NABS, soft, NT  LE: No edema.  Neuro: A/O x 2, Moving all extremities equally with normal strength, CN 3-12 intact, grossly nonfocal.  Psych: Patient speaks coherently however clearly has memory deficits.  Assessment & Plan:   Acute encephalopathy with confusion/delirium Patient seems to be back at her baseline although there may be some level of low level delirium complicating her baseline dementia. She has responded well to fluid and electrolyte repletion. I wonder if she also had not been taking her medication and so may have had some withdrawal from her Seroquel, Lexapro, trazodone and Aricept. Her UTI seems resolved with fosfomycin Plan is for discharge home in the morning with increased supervision.  Spoke with patient's daughter who noted they will have somebody staying with her 24/7 to begin with to make  sure she is eating drinking taking her medications and is safe.  Hypothyroidism TSH markedly elevated to 67 with low free T3 and free T4. Synthroid was increased to 125, up from 100. I wonder however if maybe patient had not been taking her Synthroid at home. Patient will need to have her TSH rechecked in 6 weeks per her PCP.  Hypokalemia Repleted and normalized  Dementia Continue Aricept per home doses  Depression Continue Lexapro, Seroquel and trazodone per home doses  GERD with  large hiatal hernia Continue PPI and Carafate per home doses   DVT prophylaxis: Lovenox Code Status: DNR Family Communication: Spoke with patient's daughter [place] Disposition Plan:   Patient is from: [facility] [place]  Anticipated Discharge Location: [place]  Barriers to Discharge: Patient is ready for discharge  Is patient medically stable for Discharge: Yes   Consultants:  None  Procedures:  None  Antimicrobials:  None   Data Reviewed:  Basic Metabolic Panel: Recent Labs  Lab 10/04/19 1216 10/07/19 1125 10/07/19 2115 10/08/19 0413 10/08/19 1518 10/09/19 0527  NA 138 138  --  139  --  140  K 3.5 3.1*  --  2.8* 4.1 4.7  CL 99 100  --  102  --  103  CO2 28 29  --  29  --  29  GLUCOSE 109* 104*  --  108*  --  86  BUN 14 15  --  12  --  8  CREATININE 1.31* 0.96  --  0.74  --  0.67  CALCIUM 8.9 8.6*  --  8.1*  --  8.6*  MG  --   --  1.9  --   --  2.0  PHOS  --   --   --   --   --  2.1*   Liver Function Tests: Recent Labs  Lab 10/04/19 1216 10/07/19 1125  AST 27 21  ALT 11 11  ALKPHOS 58 55  BILITOT 0.7 0.7  PROT 6.7 6.3*  ALBUMIN 3.3* 3.1*   Recent Labs  Lab 10/07/19 1125  LIPASE 47   Recent Labs  Lab 10/07/19 1549  AMMONIA 10   CBC: Recent Labs  Lab 10/04/19 1216 10/07/19 1125 10/07/19 1753 10/08/19 0413 10/09/19 0527  WBC 5.6 4.6  4.7  --  3.7* 3.7*  NEUTROABS  --  2.6  --   --   --   HGB 14.3 12.5  12.7  --  11.1* 11.2*  HCT 43.9 38.7  39.1 37.0 33.4* 33.7*  MCV 85.2 86.0  85.9  --  84.8 84.7  PLT 250 238  215  --  187 195   Cardiac Enzymes: No results for input(s): CKTOTAL, CKMB, CKMBINDEX, TROPONINI in the last 168 hours. BNP (last 3 results) No results for input(s): PROBNP in the last 8760 hours. CBG: No results for input(s): GLUCAP in the last 168 hours.  Recent Results (from the past 240 hour(s))  Urine Culture     Status: None   Collection Time: 10/04/19 12:23 PM   Specimen: Urine, Catheterized    Result Value Ref Range Status   Specimen Description   Final    URINE, CATHETERIZED Performed at [facility], [address], [place], [place] [address]    Special Requests   Final    NONE Performed at [facility], [address], [place], [place] [address]    Culture   Final    NO GROWTH Performed at [facility], [address] [place], [place] [address]    Report Status 10/05/2019 FINAL  Final  SARS Coronavirus 2 by RT PCR (hospital order, performed in [facility]) Nasopharyngeal Nasopharyngeal Swab     Status: None   Collection Time: 10/08/19 12:12 AM   Specimen: Nasopharyngeal Swab  Result Value Ref Range Status   SARS Coronavirus 2 NEGATIVE NEGATIVE Final    Comment: (NOTE) SARS-CoV-2 target nucleic acids are NOT DETECTED.  The SARS-CoV-2 RNA is generally detectable in upper and lower respiratory specimens during the acute phase of infection. The lowest concentration of SARS-CoV-2 viral copies this assay can detect is 250 copies / mL. A negative result does not preclude SARS-CoV-2 infection and should not be used as the sole basis for treatment or other patient management decisions.  A negative result may occur with improper specimen collection / handling, submission of specimen other than nasopharyngeal swab, presence of viral mutation(s) within the areas targeted by this assay, and inadequate number of viral copies (<250 copies / mL). A negative result must be combined with clinical observations, patient history, and epidemiological information.  Fact Sheet for Patients:   BoilerBrush.com.cyhttps://www.fda.gov/media/136312/download  Fact Sheet for Healthcare Providers: https://pope.com/https://www.fda.gov/media/136313/download  This test is not yet approved or  cleared by the [place] FDA and has been authorized for detection and/or diagnosis of SARS-CoV-2 by FDA under an Emergency Use Authorization (EUA).  This EUA will remain in effect (meaning this  test can be used) for the duration of the COVID-19 declaration under Section 564(b)(1) of the Act, 21 U.S.C. section 360bbb-3(b)(1), unless the authorization is terminated or revoked sooner.  Performed at [facility], [address] [place], [place] [address]       Studies: CT Head Wo Contrast  Result Date: 10/07/2019 CLINICAL DATA:  Head injury after fall 3 days ago. EXAM: CT HEAD WITHOUT CONTRAST TECHNIQUE: Contiguous axial images were obtained from the base of the skull through the vertex without intravenous contrast. COMPARISON:  October 04, 2019. FINDINGS: Brain: Mild diffuse cortical atrophy is noted. Mild chronic ischemic white matter disease is noted. No mass effect or midline shift is noted. Ventricular size is within normal limits. There is no evidence of mass lesion, hemorrhage or acute infarction. Vascular: No hyperdense vessel or unexpected calcification. Skull: Normal. Negative for fracture or focal lesion. Sinuses/Orbits: No acute finding. Other: None. IMPRESSION: Mild diffuse cortical atrophy. Mild chronic ischemic white matter disease. No acute intracranial abnormality seen. Electronically Signed   By: Lupita RaiderJames  Green Jr M.D.   On: 10/07/2019 16:46   MR BRAIN WO CONTRAST  Result Date: 10/07/2019 CLINICAL DATA:  Initial evaluation for acute encephalopathy. EXAM: MRI HEAD WITHOUT CONTRAST TECHNIQUE: Multiplanar, multiecho pulse sequences of the brain and surrounding structures were obtained without intravenous contrast. COMPARISON:  Comparison made with prior CT from earlier the same day. FINDINGS: Brain: Moderately advanced age-related cerebral atrophy with chronic small vessel ischemic disease. No abnormal foci of restricted diffusion to suggest acute or subacute ischemia. Gray-white matter differentiation maintained. No encephalomalacia to suggest chronic cortical infarction. No acute intracranial hemorrhage. Few scattered punctate chronic micro hemorrhages noted, likely due  to underlying hypertension and/or small vessel disease. No mass lesion, midline shift or mass effect. No hydrocephalus or extra-axial fluid collection. Pituitary gland suprasellar region within normal limits. Midline structures intact. Vascular: Major intracranial vascular flow voids are maintained. Skull and upper cervical spine: Craniocervical junction within normal limits. Bone marrow signal intensity normal. No scalp soft tissue abnormality. Sinuses/Orbits: Patient status post  bilateral ocular lens replacement. Globes and orbital soft tissues demonstrate no acute finding. Left maxillary sinus retention cyst noted. Paranasal sinuses are otherwise clear. No significant mastoid effusion. Inner ear structures grossly normal. Other: None. IMPRESSION: 1. No acute intracranial abnormality. 2. Moderately advanced age-related cerebral atrophy with chronic small vessel ischemic disease. Electronically Signed   By: Rise Mu M.D.   On: 10/07/2019 22:44   CT ABDOMEN PELVIS W CONTRAST  Result Date: 10/07/2019 CLINICAL DATA:  Nausea and vomiting. Possible UTI. Poor p.o. intake. EXAM: CT ABDOMEN AND PELVIS WITH CONTRAST TECHNIQUE: Multidetector CT imaging of the abdomen and pelvis was performed using the standard protocol following bolus administration of intravenous contrast. CONTRAST:  OMNIPAQUE IOHEXOL 300 MG/ML  SOLN COMPARISON:  September 19, 2018 FINDINGS: Lower chest: The lung bases are clear.The heart size is mildly enlarged. Coronary artery calcifications are noted. Hepatobiliary: The liver is normal. Cholelithiasis without acute inflammation.There is no biliary ductal dilation. Pancreas: The pancreatic duct is dilated which is stable since 2020. the pancreas is herniated into the thoracic cavity. Spleen: Unremarkable. Adrenals/Urinary Tract: --Adrenal glands: Unremarkable. --Right kidney/ureter: No hydronephrosis or radiopaque kidney stones. --Left kidney/ureter: No hydronephrosis or radiopaque kidney  stones. --Urinary bladder: The bladder is suboptimally evaluated secondary to extensive streak artifact through the patient's pelvis. The visualized portions are unremarkable. Stomach/Bowel: --Stomach/Duodenum: The majority of the stomach is herniated into the thoracic cavity as before. There is no definite evidence for an obstruction. --Small bowel: Unremarkable. --Colon: There is an above average amount of stool throughout the visualized portions of the colon. There are scattered colonic diverticula without CT evidence for diverticulitis. --Appendix: Normal. Vascular/Lymphatic: Atherosclerotic calcification is present within the non-aneurysmal abdominal aorta, without hemodynamically significant stenosis. --No retroperitoneal lymphadenopathy. --No mesenteric lymphadenopathy. --No pelvic or inguinal lymphadenopathy. Reproductive: Unremarkable Other: No ascites or free air. The abdominal wall is normal. Musculoskeletal. There is a compression fracture of the T12 vertebral body with approximately 60% height loss anteriorly. This was identified in May of 2021 and is essentially stable. There is no evidence for a new compression fracture. The patient is status post total hip arthroplasty on the right. The hardware appears grossly intact. The patient is status post prior percutaneous pinning of the left hip. The hardware appears essentially stable from the patient's prior studies IMPRESSION: 1. No acute abnormality. 2. Again noted is a large hernia containing most of the patient's stomach and portions of the pancreas. The appearance is essentially unchanged since prior study. There is no definite evidence for high-grade obstruction. 3. Cholelithiasis without acute inflammation. Aortic Atherosclerosis (ICD10-I70.0). Electronically Signed   By: Katherine Mantle M.D.   On: 10/07/2019 16:52   DG Chest Portable 1 View  Result Date: 10/07/2019 CLINICAL DATA:  Altered mental status. EXAM: PORTABLE CHEST 1 VIEW  COMPARISON:  June 21, 2019. FINDINGS: Stable cardiomediastinal silhouette. Stable large hiatal hernia is noted. No pneumothorax or pleural effusion is noted. Lungs are clear. Bony thorax is unremarkable. IMPRESSION: No active disease. Stable large hiatal hernia. Electronically Signed   By: Lupita Raider M.D.   On: 10/07/2019 16:33     Scheduled Meds: . aspirin EC  81 mg Oral Daily  . donepezil  10 mg Oral QHS  . enoxaparin (LOVENOX) injection  40 mg Subcutaneous Q24H  . escitalopram  5 mg Oral Daily  . levothyroxine  125 mcg Oral q morning - 10a  . metoprolol succinate  25 mg Oral QHS  . multivitamin with minerals  1 tablet Oral Daily  .  pantoprazole  40 mg Oral Daily  . phosphorus  500 mg Oral Q4H  . QUEtiapine  50 mg Oral QHS  . sucralfate  1 g Oral QID  . traZODone  100 mg Oral QHS   Continuous Infusions:   Active Problems:   Acute encephalopathy     Pieter Partridge, [facility]  If 7PM-7AM, please contact night-coverage [email or web address] Password [email or web address] 10/09/2019, 3:27 PM    LOS: 2 days

## 2019-10-10 LAB — CORTISOL: Cortisol, Plasma: 11.7 ug/dL

## 2019-10-10 LAB — BASIC METABOLIC PANEL
Anion gap: 9 (ref 5–15)
BUN: 8 mg/dL (ref 8–23)
CO2: 28 mmol/L (ref 22–32)
Calcium: 8.5 mg/dL — ABNORMAL LOW (ref 8.9–10.3)
Chloride: 98 mmol/L (ref 98–111)
Creatinine, Ser: 0.91 mg/dL (ref 0.44–1.00)
GFR calc Af Amer: >60 mL/min (ref >60)
GFR calc non Af Amer: 56 mL/min — ABNORMAL LOW (ref >60)
Glucose, Bld: 99 mg/dL (ref 70–99)
Potassium: 3.8 mmol/L (ref 3.5–5.1)
Sodium: 135 mmol/L (ref 135–145)

## 2019-10-10 LAB — TROPONIN I (HIGH SENSITIVITY)
Troponin I (High Sensitivity): 41 ng/L — ABNORMAL HIGH (ref <18)
Troponin I (High Sensitivity): 45 ng/L — ABNORMAL HIGH (ref <18)

## 2019-10-10 LAB — PHOSPHORUS: Phosphorus: 3.6 mg/dL (ref 2.5–4.6)

## 2019-10-10 MED ORDER — LEVOTHYROXINE SODIUM 125 MCG PO TABS: 125.0000 ug | ORAL_TABLET | Freq: Every morning | ORAL | 0 refills | Status: DC

## 2019-10-10 NOTE — Discharge Instructions (Signed)
[facility]

## 2019-10-10 NOTE — Plan of Care (Signed)

## 2019-10-10 NOTE — Progress Notes (Signed)
MD order received in [facility] to discharge pt back to [facility] with Home Health today; [facility] previously re established home health services for pt; out of facility form given to pt's son, Ndea Kilroy; verbally reviewed AVS with pt and her son, Kehaulani Fruin, no questions voiced at this time; pt discharged via wheelchair by nursing to the [facility]

## 2019-10-10 NOTE — TOC Transition Note (Signed)
Transition of Care [facility]) - CM/SW Discharge Note   Patient Details  Name: Kerry Cabrera MRN: [id number] Date of Birth: 08/23/1928  Transition [facility] Care [facility]) CM/SW Contact:  Maud Deed, LCSW Phone Number: 10/10/2019, 2:28 PM   Clinical Narrative:    Pt medically stable for discharge per MD. Pt will be transported back to [facility] by her Son. Pt will resume HH services with [facility] and have 24 hr care.   Final next level of care: Home w Home Health Services Barriers to Discharge: No Barriers Identified   Patient Goals and CMS Choice        Discharge Placement              Patient chooses bed at: Other - please specify in the comment section below: [facility]) Patient to be transferred to facility by: Son Name of family member notified: Cathlean Cower Patient and family notified of of transfer: 10/10/19  Discharge Plan and Services                                     Social Determinants of Health (SDOH) Interventions     Readmission Risk Interventions Readmission Risk Prevention Plan 10/08/2019  Transportation Screening Complete  PCP or Specialist Appt within 3-5 Days Complete  HRI or Home Care Consult Complete  Social Work Consult for Recovery Care Planning/Counseling Complete  Palliative Care Screening Not Applicable  Medication Review [facility]) Referral to Pharmacy  Some recent data might be hidden

## 2019-10-10 NOTE — Consult Note (Signed)
PHARMACY CONSULT NOTE - FOLLOW UP  Pharmacy Consult for Electrolyte Monitoring and Replacement   Recent Labs: Potassium (mmol/L)  Date Value  10/10/2019 3.8  08/21/2013 4.0   Magnesium (mg/dL)  Date Value  40/37/5436 2.0   Calcium (mg/dL)  Date Value  06/77/0340 8.5 (L)   Calcium, Total (mg/dL)  Date Value  35/24/8185 8.9   Albumin (g/dL)  Date Value  90/93/1121 3.1 (L)  08/19/2013 2.8 (L)   Phosphorus (mg/dL)  Date Value  62/44/6950 3.6   Sodium (mmol/L)  Date Value  10/10/2019 135  08/21/2013 137     Assessment: Pharmacy consulted to manage electrolytes in this 86 year old female admitted with encephalopathy and AMS.  Patient reported to having poor dietary intake lately, but without nausea, vomiting, or diarrhea.  Goal of Therapy:  Electrolytes WNL  Plan:  No replacement needed today.    Will follow up electrolytes with AM labs.  Ronnald Ramp ,PharmD, BCPS Clinical Pharmacist 10/10/2019 7:46 AM

## 2019-10-10 NOTE — Progress Notes (Signed)
Cross Cover Brief Note Patient admitted acute confusion/metabolic encephalopathy  Multifactorial with UTI recently and severe hypothyroid state now reporting chest pain., and nurse feels it is related to anxiety  At my bedside eval patient is without distress.  Pleasantly confused and denies chest pain   Thyroid hormone replacement therapy recently increased. Noncompliance with therapy prior to admission was questioned.  Increased dose may be contributory to anxiety related symptoms. However, tere is concern as well for patient with such severe hypothyroid function to also have cortisol deficiency.  Would likely benefit from hydrocortisone taper. Will check cortisol level, repeat electrolytes, ekg and troponin ekg reviewed.-  !st degree AVB ?presence prior.  Poor ekg quality with ED eval as low thyroid state likely contributory to low voltage  Awaiting blod chemistries

## 2019-10-10 NOTE — Discharge Summary (Signed)
Kerry Cabrera ZOX:[id number] DOB: February 10, 1929 DOA: 10/07/2019  PCP: Almetta Lovely, [facility]  Admit date: 10/07/2019  Discharge date: 10/10/2019  Admitted From: [facility]   disposition: [facility]   Recommendations for Outpatient Follow-up:   Follow up with PCP in 1-2 weeks  Home Health: To be arranged by family Equipment/Devices: Patient has rolling walker at home Consultations: None Discharge Condition: Improved CODE STATUS: DNR Diet Recommendation: Heart Healthy   Diet Order            Diet general           Diet Heart Room service appropriate? Yes; Fluid consistency: Thin  Diet effective now                  Chief Complaint  Patient presents with  . Urinary Tract Infection  . Anorexia     Brief history of present illness from the day of admission and additional interim summary     Kerry Cabrera  is a 86 y.o. Caucasian female with a known history of dementia, depression, hypertension, dyslipidemia and hypothyroidism, who presented to the emergency room with acute onset of recurrent altered mental status with confusion.  The patient was seen in the ER here 3 days ago for confusion at her independent living facility and was thought to have possible UTI here in the ER.  She was given fosfomycin and in the ER she was back to her baseline.  According to her friend and neighbor who was with her in the emergency room, she has not been eating lately and has been sleeping a lot.  She has been losing weight.  No nausea or vomiting or diarrhea or abdominal pain.  No chest pain or dyspnea or palpitations or cough or wheezing.  Upon presentation to the emergency room, blood pressure was 157/90 with otherwise normal vital signs.  Labs revealed negative urinalysis, hypokalemia of 3.1, with otherwise unremarkable  CMP, ammonia level of 10 and unremarkable CBC.  TSH came back elevated at 67.  Portable chest ray showed stable large hiatal hernia no acute cardiopulmonary disease.  Abdominal and pelvic CT scan showed cholelithiasis without cholecystitis, large hiatal hernia containing most of the patient's stomach and portions of the pancreas with unchanged appearance since prior study and no evidence for high-grade obstruction with aortic atherosclerosis and no other acute abnormalities.  Head CT scan revealed mild diffuse cortical atrophy and mild chronic ischemic white matter disease with no acute intracranial abnormality seen.                                                                  Hospital Course   Patient was admitted and treatment was initiated with IV fluid resuscitation, potassium supplementation, aspirin pending MRI and increase of Synthroid due to markedly elevated TSH.  Lorazepam was held.  The following morning patient was noted to be significantly improved, was noted to be sitting up in bed and chatting with her neighbor Mrs. Earl Gala.  Patient clearly had significant memory deficits however was coherent in her speech and was logical within the context of the conversation.  IV fluids were discontinued.  Patient continued to eat and drink reasonably well and was able to maintain her baseline mental status.  Patient's care was discussed with her daughter Robie Ridge and it was noted that most of patient's altered mental status was likely secondary to dehydration likely due to decreased p.o. intake.  Of note patient's lorazepam has been held and it is likely that she is more awake because she is not on the lorazepam.  Also of note patient's hypothyroidism may have been contributory.  Patient's care was discussed with her daughter and her family will provide full-time caregiver for the near future to make sure patient stays awake, alert and eating and drinking well.  Will also be important to make sure  that patient is getting her medications appropriately.  Lorazepam has been discontinued for now and this can be readdressed with her PCP as warranted.  Benzodiazepines are known to be associated with delirium in the elderly and this may well have contributed to her daytime somnolence and not eating and drinking properly.   Acute encephalopathy with confusion/delirium Patient is much improved, mental status normalized with treatment of IV fluid resuscitation and holding of lorazepam. MRI rules out acute stroke.  She does have chronic microvascular changes. Her UTI is resolved with fosfomycin. Patient now discharged home with close supervision to ensure adequate p.o. intake and regular dosing of her medications. She is to resume with home health PT. Lorazepam has been discontinued and this can be addressed with her PCP as warranted.  Hypokalemia Potassium has normalized after aggressive repletion.  Hypothyroidism TSH markedly elevated to 67 with low free T3 and free T4. Synthroid was increased to 125, up from 100. I wonder however if maybe patient had not been taking her Synthroid at home. Patient will need to have her TSH rechecked in 6 weeks per her PCP.  Dementia Continue Aricept per home doses. Patient may benefit from addition of Namenda, this can be discussed with her outpatient PCP  Depression Continue Lexapro, Seroquel and trazodone per home doses  GERD with large hiatal hernia Continue PPI and Carafate per home doses    Discharge diagnosis     Active Problems:   Acute encephalopathy    Discharge instructions    Discharge Instructions    Diet general   Complete by: As directed    Discharge instructions   Complete by: As directed    1.  It is very important that you keep eating and drinking regularly so that you do not become dehydrated again. 2.  I am stopping your lorazepam as it might be making you sleepy and confused.  You can discuss this with your PCP if  you want. 3.  We have increased your Synthroid for your hypothyroidism to 125 MCG (up from 100 MCG) because it looks like your thyroid was slow.  It is possible that you have not been taking your thyroid medicines and that is why your thyroid is low.  Please make sure you get your thyroid levels (TSH) checked in 6 weeks by your PCP.   Increase activity slowly   Complete by: As directed    Make sure to work with home health physical  therapy to stay safe and avoid falling.      Discharge Medications   Allergies as of 10/10/2019      Reactions   Ace Inhibitors Cough   Codeine Nausea And Vomiting, Nausea Only   Gabapentin Other (See Comments)   unsure   Micardis Hct [telmisartan-hctz]    unsure   Prednisone Other (See Comments)   Pt has received cortisone injection for knee with out problems. Pt states she will not take pills for extended time due to feeling lethargic and weight gain. Weight gain   Sulfa Antibiotics Other (See Comments), Rash   Fever 106 degrees,  sent to ER High fever   Zithromax [azithromycin] Other (See Comments), Rash   unsure      Medication List    STOP taking these medications   LORazepam 1 MG tablet Commonly known as: ATIVAN     TAKE these medications   acetaminophen 500 MG tablet Commonly known as: TYLENOL Take 500-1,000 mg by mouth every 6 (six) hours as needed for mild pain or fever.   ciprofloxacin 250 MG tablet Commonly known as: CIPRO Take 250 mg by mouth in the morning and at bedtime.   divalproex 250 MG DR tablet Commonly known as: DEPAKOTE Take 250 mg by mouth 2 (two) times daily.   donepezil 10 MG tablet Commonly known as: ARICEPT Take 10 mg by mouth at bedtime.   escitalopram 5 MG tablet Commonly known as: LEXAPRO Take 5 mg by mouth daily.   ferrous sulfate 325 (65 FE) MG tablet Take 325 mg by mouth daily with breakfast.   folic acid 800 MCG tablet Commonly known as: FOLVITE Take 800 mcg by mouth daily.   levothyroxine 125  MCG tablet Commonly known as: SYNTHROID Take 1 tablet (125 mcg total) by mouth every morning. Start taking on: October 11, 2019 What changed:   medication strength  how much to take   metoprolol succinate 25 MG 24 hr tablet Commonly known as: TOPROL-XL Take 25 mg by mouth at bedtime.   multivitamin tablet Take 1 tablet by mouth daily.   omeprazole 40 MG capsule Commonly known as: PRILOSEC Take 40 mg by mouth 2 (two) times daily.   QUEtiapine 50 MG tablet Commonly known as: SEROQUEL Take 50 mg by mouth at bedtime.   sucralfate 1 g tablet Commonly known as: CARAFATE Take 1 g by mouth 4 (four) times daily.   traZODone 100 MG tablet Commonly known as: DESYREL Take 100 mg by mouth at bedtime.   vitamin B-12 1000 MCG tablet Commonly known as: CYANOCOBALAMIN Take 1,000 mcg by mouth daily.         Major procedures and Radiology Reports - PLEASE review detailed and final reports thoroughly  -      CT Head Wo Contrast  Result Date: 10/07/2019 CLINICAL DATA:  Head injury after fall 3 days ago. EXAM: CT HEAD WITHOUT CONTRAST TECHNIQUE: Contiguous axial images were obtained from the base of the skull through the vertex without intravenous contrast. COMPARISON:  October 04, 2019. FINDINGS: Brain: Mild diffuse cortical atrophy is noted. Mild chronic ischemic white matter disease is noted. No mass effect or midline shift is noted. Ventricular size is within normal limits. There is no evidence of mass lesion, hemorrhage or acute infarction. Vascular: No hyperdense vessel or unexpected calcification. Skull: Normal. Negative for fracture or focal lesion. Sinuses/Orbits: No acute finding. Other: None. IMPRESSION: Mild diffuse cortical atrophy. Mild chronic ischemic white matter disease. No acute intracranial abnormality seen. Electronically Signed  By: Lupita RaiderJames  Green Jr M.D.   On: 10/07/2019 16:46   CT Head Wo Contrast  Result Date: 10/04/2019 CLINICAL DATA:  Falls over the last 24 hours,  unwitnessed. Minor head trauma. EXAM: CT HEAD WITHOUT CONTRAST TECHNIQUE: Contiguous axial images were obtained from the base of the skull through the vertex without intravenous contrast. COMPARISON:  09/17/2019 FINDINGS: Brain: No evidence of acute infarction, hemorrhage, hydrocephalus, extra-axial collection or mass lesion/mass effect. There is ventricular and sulcal enlargement reflecting age-appropriate volume loss. Patchy white matter hypoattenuation is noted bilaterally consistent with moderate chronic microvascular ischemic change. These findings are stable. Vascular: No hyperdense vessel or unexpected calcification. Skull: Normal. Negative for fracture or focal lesion. Sinuses/Orbits: Globes and orbits are unremarkable. Minor ethmoid and left maxillary sinus mucosal thickening. Other: None. IMPRESSION: 1. No acute intracranial abnormalities. 2. Age-appropriate volume loss and moderate chronic microvascular ischemic change. Electronically Signed   By: Amie Portlandavid  Ormond M.D.   On: 10/04/2019 13:29   CT HEAD WO CONTRAST  Result Date: 09/17/2019 CLINICAL DATA:  86 year old female with history of trauma from a fall 8 days ago. Worsening weakness and confusion. EXAM: CT HEAD WITHOUT CONTRAST TECHNIQUE: Contiguous axial images were obtained from the base of the skull through the vertex without intravenous contrast. COMPARISON:  Head CT 06/21/2019. FINDINGS: Brain: Moderate cerebral and mild cerebellar atrophy. Patchy and confluent areas of decreased attenuation are noted throughout the deep and periventricular white matter of the cerebral hemispheres bilaterally, compatible with chronic microvascular ischemic disease. No evidence of acute infarction, hemorrhage, hydrocephalus, extra-axial collection or mass lesion/mass effect. Vascular: No hyperdense vessel or unexpected calcification. Skull: Normal. Negative for fracture or focal lesion. Sinuses/Orbits: No acute finding. Other: None. IMPRESSION: 1. No evidence of  significant acute traumatic injury to the skull or brain. 2. Moderate cerebral and mild cerebellar atrophy with extensive chronic microvascular ischemic changes in the cerebral white matter. Electronically Signed   By: Trudie Reedaniel  Entrikin M.D.   On: 09/17/2019 11:56   MR BRAIN WO CONTRAST  Result Date: 10/07/2019 CLINICAL DATA:  Initial evaluation for acute encephalopathy. EXAM: MRI HEAD WITHOUT CONTRAST TECHNIQUE: Multiplanar, multiecho pulse sequences of the brain and surrounding structures were obtained without intravenous contrast. COMPARISON:  Comparison made with prior CT from earlier the same day. FINDINGS: Brain: Moderately advanced age-related cerebral atrophy with chronic small vessel ischemic disease. No abnormal foci of restricted diffusion to suggest acute or subacute ischemia. Gray-white matter differentiation maintained. No encephalomalacia to suggest chronic cortical infarction. No acute intracranial hemorrhage. Few scattered punctate chronic micro hemorrhages noted, likely due to underlying hypertension and/or small vessel disease. No mass lesion, midline shift or mass effect. No hydrocephalus or extra-axial fluid collection. Pituitary gland suprasellar region within normal limits. Midline structures intact. Vascular: Major intracranial vascular flow voids are maintained. Skull and upper cervical spine: Craniocervical junction within normal limits. Bone marrow signal intensity normal. No scalp soft tissue abnormality. Sinuses/Orbits: Patient status post bilateral ocular lens replacement. Globes and orbital soft tissues demonstrate no acute finding. Left maxillary sinus retention cyst noted. Paranasal sinuses are otherwise clear. No significant mastoid effusion. Inner ear structures grossly normal. Other: None. IMPRESSION: 1. No acute intracranial abnormality. 2. Moderately advanced age-related cerebral atrophy with chronic small vessel ischemic disease. Electronically Signed   By: Rise MuBenjamin   McClintock M.D.   On: 10/07/2019 22:44   CT ABDOMEN PELVIS W CONTRAST  Result Date: 10/07/2019 CLINICAL DATA:  Nausea and vomiting. Possible UTI. Poor p.o. intake. EXAM: CT ABDOMEN AND PELVIS WITH CONTRAST TECHNIQUE: Multidetector  CT imaging of the abdomen and pelvis was performed using the standard protocol following bolus administration of intravenous contrast. CONTRAST:  OMNIPAQUE IOHEXOL 300 MG/ML  SOLN COMPARISON:  September 19, 2018 FINDINGS: Lower chest: The lung bases are clear.The heart size is mildly enlarged. Coronary artery calcifications are noted. Hepatobiliary: The liver is normal. Cholelithiasis without acute inflammation.There is no biliary ductal dilation. Pancreas: The pancreatic duct is dilated which is stable since 2020. the pancreas is herniated into the thoracic cavity. Spleen: Unremarkable. Adrenals/Urinary Tract: --Adrenal glands: Unremarkable. --Right kidney/ureter: No hydronephrosis or radiopaque kidney stones. --Left kidney/ureter: No hydronephrosis or radiopaque kidney stones. --Urinary bladder: The bladder is suboptimally evaluated secondary to extensive streak artifact through the patient's pelvis. The visualized portions are unremarkable. Stomach/Bowel: --Stomach/Duodenum: The majority of the stomach is herniated into the thoracic cavity as before. There is no definite evidence for an obstruction. --Small bowel: Unremarkable. --Colon: There is an above average amount of stool throughout the visualized portions of the colon. There are scattered colonic diverticula without CT evidence for diverticulitis. --Appendix: Normal. Vascular/Lymphatic: Atherosclerotic calcification is present within the non-aneurysmal abdominal aorta, without hemodynamically significant stenosis. --No retroperitoneal lymphadenopathy. --No mesenteric lymphadenopathy. --No pelvic or inguinal lymphadenopathy. Reproductive: Unremarkable Other: No ascites or free air. The abdominal wall is normal.  Musculoskeletal. There is a compression fracture of the T12 vertebral body with approximately 60% height loss anteriorly. This was identified in May of 2021 and is essentially stable. There is no evidence for a new compression fracture. The patient is status post total hip arthroplasty on the right. The hardware appears grossly intact. The patient is status post prior percutaneous pinning of the left hip. The hardware appears essentially stable from the patient's prior studies IMPRESSION: 1. No acute abnormality. 2. Again noted is a large hernia containing most of the patient's stomach and portions of the pancreas. The appearance is essentially unchanged since prior study. There is no definite evidence for high-grade obstruction. 3. Cholelithiasis without acute inflammation. Aortic Atherosclerosis (ICD10-I70.0). Electronically Signed   By: Katherine Mantle M.D.   On: 10/07/2019 16:52   DG Chest Portable 1 View  Result Date: 10/07/2019 CLINICAL DATA:  Altered mental status. EXAM: PORTABLE CHEST 1 VIEW COMPARISON:  June 21, 2019. FINDINGS: Stable cardiomediastinal silhouette. Stable large hiatal hernia is noted. No pneumothorax or pleural effusion is noted. Lungs are clear. Bony thorax is unremarkable. IMPRESSION: No active disease. Stable large hiatal hernia. Electronically Signed   By: Lupita Raider M.D.   On: 10/07/2019 16:33    Micro Results    Recent Results (from the past 240 hour(s))  Urine Culture     Status: None   Collection Time: 10/04/19 12:23 PM   Specimen: Urine, Catheterized  Result Value Ref Range Status   Specimen Description   Final    URINE, CATHETERIZED Performed at [facility], [address] [place], [place] [address]    Special Requests   Final    NONE Performed at [facility], [address] [place], [place] [address]    Culture   Final    NO GROWTH Performed at [facility], [address], [place], [place] [address]    Report  Status 10/05/2019 FINAL  Final  SARS Coronavirus 2 by RT PCR (hospital order, performed in [facility] hospital lab) Nasopharyngeal Nasopharyngeal Swab     Status: None   Collection Time: 10/08/19 12:12 AM   Specimen: Nasopharyngeal Swab  Result Value Ref Range Status  SARS Coronavirus 2 NEGATIVE NEGATIVE Final    Comment: (NOTE) SARS-CoV-2 target nucleic acids are NOT DETECTED.  The SARS-CoV-2 RNA is generally detectable in upper and lower respiratory specimens during the acute phase of infection. The lowest concentration of SARS-CoV-2 viral copies this assay can detect is 250 copies / mL. A negative result does not preclude SARS-CoV-2 infection and should not be used as the sole basis for treatment or other patient management decisions.  A negative result may occur with improper specimen collection / handling, submission of specimen other than nasopharyngeal swab, presence of viral mutation(s) within the areas targeted by this assay, and inadequate number of viral copies (<250 copies / mL). A negative result must be combined with clinical observations, patient history, and epidemiological information.  Fact Sheet for Patients:   BoilerBrush.com.cy  Fact Sheet for Healthcare Providers: https://pope.com/  This test is not yet approved or  cleared by the [place] FDA and has been authorized for detection and/or diagnosis of SARS-CoV-2 by FDA under an Emergency Use Authorization (EUA).  This EUA will remain in effect (meaning this test can be used) for the duration of the COVID-19 declaration under Section 564(b)(1) of the Act, 21 U.S.C. section 360bbb-3(b)(1), unless the authorization is terminated or revoked sooner.  Performed at [facility], [address] [place], [place] [address]     Today   Subjective    Kerry Cabrera has no acute complaints.  She knows that she lives at [facility].  She is willing to  go home.  She is looking forward to seeing her children.   Objective   Blood pressure (!) 151/81, pulse 62, temperature 98.2 F (36.8 C), temperature source Oral, resp. rate 20, height  (1.676 m), weight 59.9 kg, SpO2 96 %.   Intake/Output Summary (Last 24 hours) at 10/10/2019 1429 Last data filed at 10/10/2019 0929 Gross per 24 hour  Intake 240 ml  Output --  Net 240 ml    Exam General: Patient is awake and alert and feeding herself without difficulty.   CVS: S1-S2, regular  Respiratory:  decreased air entry bilaterally secondary to decreased inspiratory effort, rales at bases  GI: NABS, soft, NT  LE: No edema.  Neuro: Grossly nonfocal.  Psych: Patient with clear dementia and memory deficits although her language is intact.    Data Review   CBC w Diff:  Lab Results  Component Value Date   WBC 3.7 (L) 10/09/2019   HGB 11.2 (L) 10/09/2019   HGB 10.9 (L) 08/23/2013   HCT 33.7 (L) 10/09/2019   HCT 37.0 10/07/2019   PLT 195 10/09/2019   PLT 243 08/23/2013   LYMPHOPCT 32 10/07/2019   LYMPHOPCT 24.2 08/23/2013   MONOPCT 9 10/07/2019   MONOPCT 9.3 08/23/2013   EOSPCT 1 10/07/2019   EOSPCT 2.3 08/23/2013   BASOPCT 1 10/07/2019   BASOPCT 1.1 08/23/2013    CMP:  Lab Results  Component Value Date   NA 135 10/10/2019   NA 137 08/21/2013   K 3.8 10/10/2019   K 4.0 08/21/2013   CL 98 10/10/2019   CL 102 08/21/2013   CO2 28 10/10/2019   CO2 29 08/21/2013   BUN 8 10/10/2019   BUN 13 08/21/2013   CREATININE 0.91 10/10/2019   CREATININE 0.70 08/21/2013   PROT 6.3 (L) 10/07/2019   PROT 6.5 08/19/2013   ALBUMIN 3.1 (L) 10/07/2019   ALBUMIN 2.8 (L) 08/19/2013   BILITOT 0.7 10/07/2019   BILITOT 0.6 08/19/2013  ALKPHOS 55 10/07/2019   ALKPHOS 68 08/19/2013   AST 21 10/07/2019   AST 21 08/19/2013   ALT 11 10/07/2019   ALT 16 08/19/2013  .   Total Time in preparing paper work, data evaluation and todays exam - 35 minutes  Pieter Partridge M.D on  10/10/2019 at 2:29 PM  [facility]   Office  [phone]

## 2019-10-10 NOTE — Progress Notes (Signed)
Patient appears to be sleeping, but will not respond to voice. Patient responds to noxious stimuli. Patient's pupils are equal, round, and reactive to light and accommodation. When this RN opened patient's eyelids to do pupil assessment, patient attempted to squint eyes shut. This RN listened to patient's lung sounds and lung sounds appear clear.

## 2020-09-27 ENCOUNTER — Emergency Department: Payer: Medicare Other

## 2020-09-27 ENCOUNTER — Encounter: Payer: Self-pay | Admitting: Emergency Medicine

## 2020-09-27 ENCOUNTER — Inpatient Hospital Stay
Admission: EM | Admit: 2020-09-27 | Discharge: 2020-09-30 | DRG: 315 | Disposition: A | Discharge status: Skilled Nursing Facility | Payer: Medicare Other | Attending: Internal Medicine | Admitting: Internal Medicine

## 2020-09-27 ENCOUNTER — Other Ambulatory Visit: Payer: Self-pay

## 2020-09-27 DIAGNOSIS — Z96641 Presence of right artificial hip joint: Secondary | ICD-10-CM | POA: Diagnosis present

## 2020-09-27 DIAGNOSIS — E039 Hypothyroidism, unspecified: Secondary | ICD-10-CM | POA: Diagnosis not present

## 2020-09-27 DIAGNOSIS — R000 Tachycardia, unspecified: Secondary | ICD-10-CM | POA: Diagnosis present

## 2020-09-27 DIAGNOSIS — E872 Acidosis, unspecified: Secondary | ICD-10-CM

## 2020-09-27 DIAGNOSIS — I10 Essential (primary) hypertension: Secondary | ICD-10-CM | POA: Diagnosis present

## 2020-09-27 DIAGNOSIS — I959 Hypotension, unspecified: Principal | ICD-10-CM | POA: Diagnosis present

## 2020-09-27 DIAGNOSIS — K449 Diaphragmatic hernia without obstruction or gangrene: Secondary | ICD-10-CM | POA: Diagnosis present

## 2020-09-27 DIAGNOSIS — R55 Syncope and collapse: Secondary | ICD-10-CM | POA: Diagnosis not present

## 2020-09-27 DIAGNOSIS — I248 Other forms of acute ischemic heart disease: Secondary | ICD-10-CM | POA: Diagnosis present

## 2020-09-27 DIAGNOSIS — R778 Other specified abnormalities of plasma proteins: Secondary | ICD-10-CM

## 2020-09-27 DIAGNOSIS — E860 Dehydration: Secondary | ICD-10-CM

## 2020-09-27 DIAGNOSIS — Z8711 Personal history of peptic ulcer disease: Secondary | ICD-10-CM

## 2020-09-27 DIAGNOSIS — R7989 Other specified abnormal findings of blood chemistry: Secondary | ICD-10-CM

## 2020-09-27 DIAGNOSIS — E876 Hypokalemia: Secondary | ICD-10-CM | POA: Diagnosis present

## 2020-09-27 DIAGNOSIS — Z79899 Other long term (current) drug therapy: Secondary | ICD-10-CM

## 2020-09-27 DIAGNOSIS — D649 Anemia, unspecified: Secondary | ICD-10-CM | POA: Diagnosis present

## 2020-09-27 DIAGNOSIS — F0390 Unspecified dementia without behavioral disturbance: Secondary | ICD-10-CM

## 2020-09-27 DIAGNOSIS — Z87891 Personal history of nicotine dependence: Secondary | ICD-10-CM

## 2020-09-27 DIAGNOSIS — I440 Atrioventricular block, first degree: Secondary | ICD-10-CM | POA: Diagnosis present

## 2020-09-27 DIAGNOSIS — Z888 Allergy status to other drugs, medicaments and biological substances status: Secondary | ICD-10-CM

## 2020-09-27 DIAGNOSIS — I9589 Other hypotension: Secondary | ICD-10-CM | POA: Diagnosis not present

## 2020-09-27 DIAGNOSIS — Z20822 Contact with and (suspected) exposure to covid-19: Secondary | ICD-10-CM | POA: Diagnosis present

## 2020-09-27 DIAGNOSIS — Z8673 Personal history of transient ischemic attack (TIA), and cerebral infarction without residual deficits: Secondary | ICD-10-CM

## 2020-09-27 DIAGNOSIS — Z79890 Hormone replacement therapy (postmenopausal): Secondary | ICD-10-CM

## 2020-09-27 DIAGNOSIS — E785 Hyperlipidemia, unspecified: Secondary | ICD-10-CM | POA: Diagnosis present

## 2020-09-27 DIAGNOSIS — Z885 Allergy status to narcotic agent status: Secondary | ICD-10-CM

## 2020-09-27 DIAGNOSIS — Z882 Allergy status to sulfonamides status: Secondary | ICD-10-CM

## 2020-09-27 DIAGNOSIS — Z96652 Presence of left artificial knee joint: Secondary | ICD-10-CM | POA: Diagnosis present

## 2020-09-27 DIAGNOSIS — I2510 Atherosclerotic heart disease of native coronary artery without angina pectoris: Secondary | ICD-10-CM | POA: Diagnosis present

## 2020-09-27 LAB — COMPREHENSIVE METABOLIC PANEL
ALT: <5 U/L (ref 0–44)
AST: 30 U/L (ref 15–41)
Albumin: 2.4 g/dL — ABNORMAL LOW (ref 3.5–5.0)
Alkaline Phosphatase: 42 U/L (ref 38–126)
Anion gap: 11 (ref 5–15)
BUN: 9 mg/dL (ref 8–23)
CO2: 23 mmol/L (ref 22–32)
Calcium: 7.6 mg/dL — ABNORMAL LOW (ref 8.9–10.3)
Chloride: 97 mmol/L — ABNORMAL LOW (ref 98–111)
Creatinine, Ser: 0.64 mg/dL (ref 0.44–1.00)
GFR, Estimated: >60 mL/min (ref >60)
Glucose, Bld: 108 mg/dL — ABNORMAL HIGH (ref 70–99)
Potassium: 3.9 mmol/L (ref 3.5–5.1)
Sodium: 131 mmol/L — ABNORMAL LOW (ref 135–145)
Total Bilirubin: 1.1 mg/dL (ref 0.3–1.2)
Total Protein: 4.5 g/dL — ABNORMAL LOW (ref 6.5–8.1)

## 2020-09-27 LAB — URINALYSIS, COMPLETE (UACMP) WITH MICROSCOPIC
Bilirubin Urine: NEGATIVE
Glucose, UA: NEGATIVE mg/dL
Hgb urine dipstick: NEGATIVE
Ketones, ur: 5 mg/dL — AB
Leukocytes,Ua: NEGATIVE
Nitrite: NEGATIVE
Protein, ur: 30 mg/dL — AB
Specific Gravity, Urine: 1.012 (ref 1.005–1.030)
pH: 7 (ref 5.0–8.0)

## 2020-09-27 LAB — CBC
HCT: 35.2 % — ABNORMAL LOW (ref 36.0–46.0)
Hemoglobin: 11.8 g/dL — ABNORMAL LOW (ref 12.0–15.0)
MCH: 30.5 pg (ref 26.0–34.0)
MCHC: 33.5 g/dL (ref 30.0–36.0)
MCV: 91 fL (ref 80.0–100.0)
Platelets: 218 10*3/uL (ref 150–400)
RBC: 3.87 MIL/uL (ref 3.87–5.11)
RDW: 13.3 % (ref 11.5–15.5)
WBC: 5.1 10*3/uL (ref 4.0–10.5)
nRBC: 0 % (ref 0.0–0.2)

## 2020-09-27 LAB — MAGNESIUM: Magnesium: 1.9 mg/dL (ref 1.7–2.4)

## 2020-09-27 LAB — LACTIC ACID, PLASMA: Lactic Acid, Venous: 2.4 mmol/L (ref 0.5–1.9)

## 2020-09-27 LAB — CK: Total CK: 47 U/L (ref 38–234)

## 2020-09-27 LAB — TROPONIN I (HIGH SENSITIVITY)
Troponin I (High Sensitivity): 12 ng/L (ref <18)
Troponin I (High Sensitivity): 37 ng/L — ABNORMAL HIGH (ref <18)

## 2020-09-27 MED ORDER — SUCRALFATE 1 G PO TABS
1.0000 g | ORAL_TABLET | Freq: Two times a day (BID) | ORAL | Status: DC
  Administered 2020-09-27 – 2020-09-30 (×6): 1 g via ORAL
  Filled 2020-09-27 (×6): qty 1

## 2020-09-27 MED ORDER — FERROUS SULFATE 325 (65 FE) MG PO TABS
325.0000 mg | ORAL_TABLET | Freq: Every day | ORAL | Status: DC
  Administered 2020-09-28 – 2020-09-29 (×2): 325 mg via ORAL
  Filled 2020-09-27 (×2): qty 1

## 2020-09-27 MED ORDER — TRAZODONE HCL 50 MG PO TABS
100.0000 mg | ORAL_TABLET | Freq: Every day | ORAL | Status: DC
  Administered 2020-09-27 – 2020-09-29 (×3): 100 mg via ORAL
  Filled 2020-09-27 (×2): qty 2
  Filled 2020-09-27: qty 1

## 2020-09-27 MED ORDER — GABAPENTIN 100 MG PO CAPS
100.0000 mg | ORAL_CAPSULE | Freq: Every day | ORAL | Status: DC
  Administered 2020-09-27 – 2020-09-29 (×3): 100 mg via ORAL
  Filled 2020-09-27 (×3): qty 1

## 2020-09-27 MED ORDER — LEVOTHYROXINE SODIUM 137 MCG PO TABS
137.0000 ug | ORAL_TABLET | Freq: Every day | ORAL | Status: DC
  Administered 2020-09-28 – 2020-09-30 (×3): 137 ug via ORAL
  Filled 2020-09-27 (×4): qty 1

## 2020-09-27 MED ORDER — DONEPEZIL HCL 5 MG PO TABS
10.0000 mg | ORAL_TABLET | Freq: Every day | ORAL | Status: DC
  Administered 2020-09-27 – 2020-09-29 (×3): 10 mg via ORAL
  Filled 2020-09-27 (×3): qty 2

## 2020-09-27 MED ORDER — SODIUM CHLORIDE 0.9 % IV BOLUS
500.0000 mL | Freq: Once | INTRAVENOUS | Status: AC
  Administered 2020-09-27: 500 mL via INTRAVENOUS

## 2020-09-27 MED ORDER — DIVALPROEX SODIUM 250 MG PO DR TAB
250.0000 mg | DELAYED_RELEASE_TABLET | Freq: Three times a day (TID) | ORAL | Status: DC
  Administered 2020-09-27 – 2020-09-30 (×8): 250 mg via ORAL
  Filled 2020-09-27 (×10): qty 1

## 2020-09-27 MED ORDER — ENOXAPARIN SODIUM 40 MG/0.4ML IJ SOSY
40.0000 mg | PREFILLED_SYRINGE | INTRAMUSCULAR | Status: DC
  Administered 2020-09-27 – 2020-09-29 (×3): 40 mg via SUBCUTANEOUS
  Filled 2020-09-27 (×3): qty 0.4

## 2020-09-27 MED ORDER — MELATONIN 5 MG PO TABS
5.0000 mg | ORAL_TABLET | Freq: Every day | ORAL | Status: DC
  Administered 2020-09-27 – 2020-09-29 (×3): 5 mg via ORAL
  Filled 2020-09-27 (×4): qty 1

## 2020-09-27 MED ORDER — SODIUM CHLORIDE 0.9 % IV SOLN: INTRAVENOUS | Status: AC

## 2020-09-27 MED ORDER — PANTOPRAZOLE SODIUM 40 MG PO TBEC
40.0000 mg | DELAYED_RELEASE_TABLET | Freq: Every day | ORAL | Status: DC
  Administered 2020-09-28 – 2020-09-30 (×3): 40 mg via ORAL
  Filled 2020-09-27 (×3): qty 1

## 2020-09-27 MED ORDER — B12 FOLATE 800-800 MCG PO CAPS: 1.0000 | ORAL_CAPSULE | Freq: Every day | ORAL | Status: DC

## 2020-09-27 MED ORDER — ACETAMINOPHEN 500 MG PO TABS
1000.0000 mg | ORAL_TABLET | Freq: Two times a day (BID) | ORAL | Status: DC
  Administered 2020-09-27 – 2020-09-30 (×6): 1000 mg via ORAL
  Filled 2020-09-27 (×6): qty 2

## 2020-09-27 NOTE — ED Notes (Addendum)
This NT assisted pt to ambulate to toilet.  Pt urinated and ambulated back to bed with assistance.

## 2020-09-27 NOTE — ED Notes (Signed)
Pt given a sandwich tray ?

## 2020-09-27 NOTE — ED Provider Notes (Signed)
Patient is resting, ate sandwich.  Reports she feels better.  She does seem to have cognitive issues, asking questions about orientation such as where she is at where she lives etc.  Was at 1 point trying to crawl out of bed currently has a sitter one-to-one at the bedside, and they are able to easily redirect her.  In the setting of 2 syncopal episodes with a repeat troponin that is rising and mild T wave inversions without evidence of STEMI on ECG we will opt to admit her for observation.    Admit discussed with Dr. Cleone Slim, MD 09/27/20 [phone]

## 2020-09-27 NOTE — ED Provider Notes (Signed)
[facility] Emergency Department Provider Note    Event Date/Time   First MD Initiated Contact with Patient 09/27/20 1511     (approximate)  I have reviewed the triage vital signs and the nursing notes.   HISTORY  Chief Complaint Loss of Consciousness and Fall  Level V Caveat:  Dementia  HPI Kerry Cabrera is a 87 y.o. female with the below listed past medical history presents to the ER from nursing facility and care center due to 2 syncopal episodes.  Was picked up off the floor and then had another syncopal episode.  Patient not complaining of any pain.  No additional history provided by EMS or the patient.  No to be mildly tachycardic no reported fevers no nausea or vomiting.  Uncertain as to whether she hit her head or not.  Past Medical History:  Diagnosis Date   Arthritis    Carotid artery occlusion    Complication of anesthesia    very confused for days   Coronary artery disease    Depression    Glaucoma    Hyperlipemia    Hypertension    h/o not on medication   Hypothyroidism    Insomnia    PUD (peptic ulcer disease)    in distant past   TIA (transient ischemic attack)    Family History  Problem Relation Age of Onset   Prostate cancer Son    Kidney disease Neg Hx    Kidney cancer Neg Hx    Past Surgical History:  Procedure Laterality Date   CARDIAC CATHETERIZATION     HIP PINNING,CANNULATED Left 06/21/2019   Procedure: CANNULATED HIP PINNING;  Surgeon: Christena Flake, MD;  Location: [facility] ORS;  Service: Orthopedics;  Laterality: Left;   KNEE ARTHROPLASTY Left 08/08/2015   Procedure: COMPUTER ASSISTED TOTAL KNEE ARTHROPLASTY;  Surgeon: Donato Heinz, MD;  Location: [facility] ORS;  Service: Orthopedics;  Laterality: Left;   KNEE ARTHROSCOPY     Percutaneous pinning of right femoral neck fracture     TONSILLECTOMY     TOTAL HIP ARTHROPLASTY Right 02/06/2014   Dr. Deeann Saint   Patient Active Problem List   Diagnosis Date Noted    Acute encephalopathy 10/07/2019   Closed left hip fracture (HCC) 06/21/2019   Acute respiratory failure with hypoxia (HCC) 06/21/2019   GERD (gastroesophageal reflux disease) 06/21/2019   Depression with anxiety 06/21/2019   Fall 06/21/2019   Symptomatic bradycardia 11/05/2017   Abnormal gait 12/17/2016   Avascular necrosis of femoral head (HCC) 12/17/2016   Bursitis of hip 12/17/2016   CAD (coronary artery disease) 12/17/2016   Closed fracture of intracapsular section of femur (HCC) 12/17/2016   Closed transcervical fracture of femur (HCC) 12/17/2016   Hip pain 12/17/2016   Inflammation of sacroiliac joint (HCC) 12/17/2016   Osteoarthritis of hip 12/17/2016   Osteoarthritis of knee 12/17/2016   Post-traumatic osteoarthritis 12/17/2016   Diarrhea 09/20/2016   Hiatal hernia 09/12/2016   Acquired hypothyroidism 07/04/2016   S/P total knee arthroplasty 08/08/2015   Moderate mitral insufficiency 12/20/2014   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   Hyperlipidemia, mixed 07/09/2013      Prior to Admission medications   Medication Sig Start Date End Date Taking? Authorizing Provider  acetaminophen (TYLENOL) 500 MG tablet Take 500-1,000 mg by mouth every 6 (six) hours as needed for mild pain or fever.    [provider]  divalproex (DEPAKOTE) 250 MG DR tablet Take 250 mg by mouth 2 (two) times daily.  [provider]  donepezil (ARICEPT) 10 MG tablet Take 10 mg by mouth at bedtime. 03/31/19   [provider]  escitalopram (LEXAPRO) 5 MG tablet Take 5 mg by mouth daily. 05/07/19   [provider]  ferrous sulfate 325 (65 FE) MG tablet Take 325 mg by mouth daily with breakfast.    [provider]  folic acid (FOLVITE) 800 MCG tablet Take 800 mcg by mouth daily.    [provider]  levothyroxine (SYNTHROID) 125 MCG tablet Take 1 tablet (125 mcg total) by mouth every morning. 10/11/19   Pieter Partridgehatterjee, Srobona Tublu, MD  metoprolol succinate (TOPROL-XL)  25 MG 24 hr tablet Take 25 mg by mouth at bedtime. 05/07/19   [provider]  Multiple Vitamin (MULTIVITAMIN) tablet Take 1 tablet by mouth daily.    [provider]  omeprazole (PRILOSEC) 40 MG capsule Take 40 mg by mouth 2 (two) times daily. 03/31/19   [provider]  QUEtiapine (SEROQUEL) 50 MG tablet Take 50 mg by mouth at bedtime. 05/04/19   [provider]  sucralfate (CARAFATE) 1 g tablet Take 1 g by mouth 4 (four) times daily. 06/01/19   [provider]  traZODone (DESYREL) 100 MG tablet Take 100 mg by mouth at bedtime. 06/08/19   [provider]  vitamin B-12 (CYANOCOBALAMIN) 1000 MCG tablet Take 1,000 mcg by mouth daily.    [provider]    Allergies Ace inhibitors, Codeine, Gabapentin, Micardis hct [telmisartan-hctz], Prednisone, Sulfa antibiotics, and Zithromax [azithromycin]    Social History Social History   Tobacco Use   Smoking status: Former    Years: 12.00    Pack years: 0.00    Types: Cigarettes   Smokeless tobacco: Never  Substance Use Topics   Alcohol use: Yes    Comment: osccasional   Drug use: No    Review of Systems Patient denies headaches, rhinorrhea, blurry vision, numbness, shortness of breath, chest pain, edema, cough, abdominal pain, nausea, vomiting, diarrhea, dysuria, fevers, rashes or hallucinations unless otherwise stated above in HPI. ____________________________________________   PHYSICAL EXAM:  VITAL SIGNS: Vitals:   09/27/20 1300 09/27/20 1410  BP: 94/60 108/62  Pulse: (!) 123 97  Resp: (!) 25 (!) 21  Temp:    SpO2: 96% 98%    Constitutional: Alert, disoriented to time and place Eyes: Conjunctivae are normal.  Head: Atraumatic. Nose: No congestion/rhinnorhea. Mouth/Throat: Mucous membranes are moist.   Neck: No stridor. Painless ROM.  Cardiovascular: mildly tachycardic rate, regular rhythm. Grossly normal heart sounds.  Good peripheral circulation. Respiratory:  Normal respiratory effort.  No retractions. Lungs CTAB. Gastrointestinal: Soft and nontender. No distention. No abdominal bruits. No CVA tenderness. Genitourinary: deferred Musculoskeletal: No lower extremity tenderness nor edema.  No joint effusions. Neurologic:  Normal speech and language. No gross focal neurologic deficits are appreciated. No facial droop Skin:  Skin is warm, dry and intact. No rash noted. Psychiatric: perseverative requesting to be taken back to facility, redirectable   ____________________________________________   LABS (all labs ordered are listed, but only abnormal results are displayed)  Results for orders placed or performed during the hospital encounter of 09/27/20 (from the past 24 hour(s))  CBC     Status: Abnormal   Collection Time: 09/27/20  1:22 PM  Result Value Ref Range   WBC 5.1 4.0 - 10.5 K/uL   RBC 3.87 3.87 - 5.11 MIL/uL   Hemoglobin 11.8 (L) 12.0 - 15.0 g/dL   HCT 21.335.2 (L) 08.636.0 - 57.846.0 %  MCV 91.0 80.0 - 100.0 fL   MCH 30.5 26.0 - 34.0 pg   MCHC 33.5 30.0 - 36.0 g/dL   RDW 70.1 77.9 - 39.0 %   Platelets 218 150 - 400 K/uL   nRBC 0.0 0.0 - 0.2 %  Comprehensive metabolic panel     Status: Abnormal   Collection Time: 09/27/20  1:22 PM  Result Value Ref Range   Sodium 131 (L) 135 - 145 mmol/L   Potassium 3.9 3.5 - 5.1 mmol/L   Chloride 97 (L) 98 - 111 mmol/L   CO2 23 22 - 32 mmol/L   Glucose, Bld 108 (H) 70 - 99 mg/dL   BUN 9 8 - 23 mg/dL   Creatinine, Ser 3.00 0.44 - 1.00 mg/dL   Calcium 7.6 (L) 8.9 - 10.3 mg/dL   Total Protein 4.5 (L) 6.5 - 8.1 g/dL   Albumin 2.4 (L) 3.5 - 5.0 g/dL   AST 30 15 - 41 U/L   ALT <5 0 - 44 U/L   Alkaline Phosphatase 42 38 - 126 U/L   Total Bilirubin 1.1 0.3 - 1.2 mg/dL   GFR, Estimated >92 >33 mL/min   Anion gap 11 5 - 15  Troponin I (High Sensitivity)     Status: None   Collection Time: 09/27/20  1:22 PM  Result Value Ref Range   Troponin I (High Sensitivity) 12 <18 ng/L  Urinalysis, Complete w  Microscopic     Status: Abnormal   Collection Time: 09/27/20  1:22 PM  Result Value Ref Range   Color, Urine YELLOW (A) YELLOW   APPearance CLEAR (A) CLEAR   Specific Gravity, Urine 1.012 1.005 - 1.030   pH 7.0 5.0 - 8.0   Glucose, UA NEGATIVE NEGATIVE mg/dL   Hgb urine dipstick NEGATIVE NEGATIVE   Bilirubin Urine NEGATIVE NEGATIVE   Ketones, ur 5 (A) NEGATIVE mg/dL   Protein, ur 30 (A) NEGATIVE mg/dL   Nitrite NEGATIVE NEGATIVE   Leukocytes,Ua NEGATIVE NEGATIVE   RBC / HPF 0-5 0 - 5 RBC/hpf   WBC, UA 0-5 0 - 5 WBC/hpf   Bacteria, UA RARE (A) NONE SEEN   Squamous Epithelial / LPF 0-5 0 - 5   Mucus PRESENT    Hyaline Casts, UA PRESENT   Lactic acid, plasma     Status: Abnormal   Collection Time: 09/27/20  1:22 PM  Result Value Ref Range   Lactic Acid, Venous 2.4 (HH) 0.5 - 1.9 mmol/L   ____________________________________________  EKG My review and personal interpretation at Time: 13:53   Indication: syncope  Rate: 85  Rhythm: sinus Axis: normal Other: normal intervals, no stemi, no depressions ____________________________________________  RADIOLOGY  I personally reviewed all radiographic images ordered to evaluate for the above acute complaints and reviewed radiology reports and findings.  These findings were personally discussed with the patient.  Please see medical record for radiology report.  ____________________________________________   PROCEDURES  Procedure(s) performed:  Procedures    Critical Care performed: no ____________________________________________   INITIAL IMPRESSION / ASSESSMENT AND PLAN / ED COURSE  Pertinent labs & imaging results that were available during my care of the patient were reviewed by me and considered in my medical decision making (see chart for details).   DDX: Dehydration, orthostasis, dysrhythmia, SDH, IPH, seizure, UTI, sepsis  Kerry Cabrera is a 87 y.o. who presents to the ED with presentation as described above.   Patient rise to ER nontoxic-appearing requesting discharge back to facility.  Given age and  risk factors however with concern for possible syncopal episodes placed on monitor and found to be mildly tachycardic with soft blood pressure.  Does appear clinically dry therefore will give IV fluids.  Blood work and imaging will be sent for the but differential.  Clinical Course as of 09/27/20 1542  Tue Sep 27, 2020  1414 Patient requesting discharge.  Reiterated need to give IV fluids check blood work.  Initial lactate is borderline elevated.  Suspect some poor p.o. intake we will continue with IV hydration.  CT imaging without evidence of acute focal abnormality no trauma. [PR]  1432 Patient becoming increasingly agitated and impatient she has pulled out her IV.  Heart rate has improved but will need repeat lactate will reinsert IV and continue with IV hydration.  I have a lower suspicion for sepsis. [PR]  1541 Due to history of dementia and pulling out IV patient be transferred to the main ED.  Will discuss case with Dr. Fanny Bien.  Plan repeat blood work follow-up on results.  If remains stable and lactate improved after IV hydration without evidence of infection or cardiac etiology I think should be appropriate for outpatient follow-up [PR]    Clinical Course User Index [PR] Willy Eddy, MD    The patient was evaluated in Emergency Department today for the symptoms described in the history of present illness. He/she was evaluated in the context of the global COVID-19 pandemic, which necessitated consideration that the patient might be at risk for infection with the SARS-CoV-2 virus that causes COVID-19. Institutional protocols and algorithms that pertain to the evaluation of patients at risk for COVID-19 are in a state of rapid change based on information released by regulatory bodies including the CDC and federal and state organizations. These policies and algorithms were followed during the patient's  care in the ED.  As part of my medical decision making, I reviewed the following data within the electronic MEDICAL RECORD NUMBER Nursing notes reviewed and incorporated, Labs reviewed, notes from prior ED visits and [place] Controlled Substance Database   ____________________________________________   FINAL CLINICAL IMPRESSION(S) / ED DIAGNOSES  Final diagnoses:  Syncope, unspecified syncope type  Dehydration      NEW MEDICATIONS STARTED DURING THIS VISIT:  New Prescriptions   No medications on file     Note:  This document was prepared using Dragon voice recognition software and may include unintentional dictation errors.    Willy Eddy, MD 09/27/20 [phone]

## 2020-09-27 NOTE — H&P (Signed)
History and Physical    Kerry Cabrera XFG:[id number] DOB: February 15, 1929 DOA: 09/27/2020  PCP: Almetta Lovely, [facility]  Patient coming from: [facility]  I have personally briefly reviewed patient's old medical records in [facility]  Chief Complaint: syncope  HPI: Kerry Cabrera is a 87 y.o. female with medical history significant for dementia, CAD, TIA, hypothyroidism, history of avascular necrosis of the hip, and depression who presents from her skilled nursing facility with concerns of syncope.  Patient only alert and oriented to herself and unable provide any history.  History obtained per ED documentation.  Reportedly patient had a syncopal episode and staff found her on the floor.  EMS reports systolic blood pressure of 70s upon arrival to facility.  Her blood pressure in the ER was 90/60 with improvement up to 116/60 1 liter of normal saline bolus.  She was also tachycardic up to 120 with EKG showing first-degree AV block. CBC showed no leukocytosis, mild anemia with hemoglobin of 11.8.  Sodium of 131.  BG of 108.  Corrected calcium of 8.5.  Troponin initially of 12 and increased to 37.  Lactate of 2.4.  Negative UA.  Negative CT head and cervical spine.   Review of Systems: Unable to fully obtain given dementia  Past Medical History:  Diagnosis Date   Arthritis    Carotid artery occlusion    Complication of anesthesia    very confused for days   Coronary artery disease    Depression    Glaucoma    Hyperlipemia    Hypertension    h/o not on medication   Hypothyroidism    Insomnia    PUD (peptic ulcer disease)    in distant past   TIA (transient ischemic attack)     Past Surgical History:  Procedure Laterality Date   CARDIAC CATHETERIZATION     HIP PINNING,CANNULATED Left 06/21/2019   Procedure: CANNULATED HIP PINNING;  Surgeon: Christena Flake, MD;  Location: [facility] ORS;  Service: Orthopedics;  Laterality: Left;   KNEE ARTHROPLASTY Left 08/08/2015    Procedure: COMPUTER ASSISTED TOTAL KNEE ARTHROPLASTY;  Surgeon: Donato Heinz, MD;  Location: [facility] ORS;  Service: Orthopedics;  Laterality: Left;   KNEE ARTHROSCOPY     Percutaneous pinning of right femoral neck fracture     TONSILLECTOMY     TOTAL HIP ARTHROPLASTY Right 02/06/2014   Dr. Deeann Saint     reports that she has quit smoking. She has never used smokeless tobacco. She reports current alcohol use. She reports that she does not use drugs. Social History  Allergies  Allergen Reactions   Ace Inhibitors Cough   Codeine Nausea And Vomiting and Nausea Only   Gabapentin Other (See Comments)    unsure   Micardis Hct [Telmisartan-Hctz]     unsure   Prednisone Other (See Comments)    Pt has received cortisone injection for knee with out problems. Pt states she will not take pills for extended time due to feeling lethargic and weight gain. Weight gain   Sulfa Antibiotics Other (See Comments) and Rash    Fever 106 degrees,  sent to ER High fever   Zithromax [Azithromycin] Other (See Comments) and Rash    unsure    Family History  Problem Relation Age of Onset   Prostate cancer Son    Kidney disease Neg Hx    Kidney cancer Neg Hx      Prior to Admission medications   Medication Sig Start Date  End Date Taking? Authorizing Provider  acetaminophen (TYLENOL) 500 MG tablet Take 500-1,000 mg by mouth every 6 (six) hours as needed for mild pain or fever.    [provider]  divalproex (DEPAKOTE) 250 MG DR tablet Take 250 mg by mouth 2 (two) times daily.    [provider]  donepezil (ARICEPT) 10 MG tablet Take 10 mg by mouth at bedtime. 03/31/19   [provider]  escitalopram (LEXAPRO) 5 MG tablet Take 5 mg by mouth daily. 05/07/19   [provider]  ferrous sulfate 325 (65 FE) MG tablet Take 325 mg by mouth daily with breakfast.    [provider]  folic acid (FOLVITE) 800 MCG tablet Take 800 mcg by mouth daily.    [provider]  levothyroxine (SYNTHROID) 125 MCG tablet Take 1 tablet (125 mcg total) by mouth every morning. 10/11/19   Pieter Partridgehatterjee, Srobona Tublu, MD  metoprolol succinate (TOPROL-XL) 25 MG 24 hr tablet Take 25 mg by mouth at bedtime. 05/07/19   [provider]  Multiple Vitamin (MULTIVITAMIN) tablet Take 1 tablet by mouth daily.    [provider]  omeprazole (PRILOSEC) 40 MG capsule Take 40 mg by mouth 2 (two) times daily. 03/31/19   [provider]  QUEtiapine (SEROQUEL) 50 MG tablet Take 50 mg by mouth at bedtime. 05/04/19   [provider]  sucralfate (CARAFATE) 1 g tablet Take 1 g by mouth 4 (four) times daily. 06/01/19   [provider]  traZODone (DESYREL) 100 MG tablet Take 100 mg by mouth at bedtime. 06/08/19   [provider]  vitamin B-12 (CYANOCOBALAMIN) 1000 MCG tablet Take 1,000 mcg by mouth daily.    [provider]    Physical Exam: Vitals:   09/27/20 1300 09/27/20 1410 09/27/20 1545 09/27/20 1923  BP: 94/60 108/62 116/67 125/64  Pulse: (!) 123 97 73 71  Resp: (!) 25 (!) 21 20 19   Temp:      TempSrc:      SpO2: 96% 98% 97% 94%  Weight: 60 kg     Height: 5\' 6"  (1.676 m)       Constitutional: NAD, calm, comfortable, thin elderly cachectic female laying flat in bed Vitals:   09/27/20 1300 09/27/20 1410 09/27/20 1545 09/27/20 1923  BP: 94/60 108/62 116/67 125/64  Pulse: (!) 123 97 73 71  Resp: (!) 25 (!) 21 20 19   Temp:      TempSrc:      SpO2: 96% 98% 97% 94%  Weight: 60 kg     Height: 5\' 6"  (1.676 m)      Eyes: PERRL, lids and conjunctivae normal ENMT: Mucous membranes are moist.  Neck: normal, supple Respiratory: clear to auscultation bilaterally, no wheezing, no crackles. Normal respiratory effort. No accessory muscle use.  Cardiovascular: Regular rate and rhythm, no murmurs / rubs / gallops. No extremity edema.  Abdomen: no tenderness, no masses palpated. Bowel sounds positive.  Musculoskeletal: no  clubbing / cyanosis. No joint deformity upper and lower extremities. Good ROM, no contractures. Normal muscle tone.  Skin: no rashes, lesions, ulcers. No induration Neurologic: CN 2-12 grossly intact. Sensation intact, Strength 5/5 in all 4.  Able to answer questions although answers incorrectly.  Able to follow simple commands. Psychiatric: Alert and oriented only to self.    Labs on Admission: I have personally reviewed following labs and imaging studies  CBC: Recent Labs  Lab 09/27/20 1322  WBC 5.1  HGB 11.8*  HCT 35.2*  MCV 91.0  PLT 218   Basic Metabolic Panel: Recent Labs  Lab 09/27/20 1322  NA 131*  K 3.9  CL 97*  CO2 23  GLUCOSE 108*  BUN 9  CREATININE 0.64  CALCIUM 7.6*   GFR: Estimated Creatinine Clearance: 44.6 mL/min (by C-G formula based on SCr of 0.64 mg/dL). Liver Function Tests: Recent Labs  Lab 09/27/20 1322  AST 30  ALT <5  ALKPHOS 42  BILITOT 1.1  PROT 4.5*  ALBUMIN 2.4*   No results for input(s): LIPASE, AMYLASE in the last 168 hours. No results for input(s): AMMONIA in the last 168 hours. Coagulation Profile: No results for input(s): INR, PROTIME in the last 168 hours. Cardiac Enzymes: No results for input(s): CKTOTAL, CKMB, CKMBINDEX, TROPONINI in the last 168 hours. BNP (last 3 results) No results for input(s): PROBNP in the last 8760 hours. HbA1C: No results for input(s): HGBA1C in the last 72 hours. CBG: No results for input(s): GLUCAP in the last 168 hours. Lipid Profile: No results for input(s): CHOL, HDL, LDLCALC, TRIG, CHOLHDL, LDLDIRECT in the last 72 hours. Thyroid Function Tests: No results for input(s): TSH, T4TOTAL, FREET4, T3FREE, THYROIDAB in the last 72 hours. Anemia Panel: No results for input(s): VITAMINB12, FOLATE, FERRITIN, TIBC, IRON, RETICCTPCT in the last 72 hours. Urine analysis:    Component Value Date/Time   COLORURINE YELLOW (A) 09/27/2020 1322   APPEARANCEUR CLEAR (A) 09/27/2020 1322   APPEARANCEUR  Clear 06/25/2016 1321   LABSPEC 1.012 09/27/2020 1322   LABSPEC 1.008 08/27/2013 0110   PHURINE 7.0 09/27/2020 1322   GLUCOSEU NEGATIVE 09/27/2020 1322   GLUCOSEU Negative 08/27/2013 0110   HGBUR NEGATIVE 09/27/2020 1322   BILIRUBINUR NEGATIVE 09/27/2020 1322   BILIRUBINUR Negative 06/25/2016 1321   BILIRUBINUR Negative 08/27/2013 0110   KETONESUR 5 (A) 09/27/2020 1322   PROTEINUR 30 (A) 09/27/2020 1322   NITRITE NEGATIVE 09/27/2020 1322   LEUKOCYTESUR NEGATIVE 09/27/2020 1322   LEUKOCYTESUR Negative 08/27/2013 0110    Radiological Exams on Admission: CT Head Wo Contrast  Result Date: 09/27/2020 CLINICAL DATA:  Syncopal episode.  Fell. EXAM: CT HEAD WITHOUT CONTRAST CT CERVICAL SPINE WITHOUT CONTRAST TECHNIQUE: Multidetector CT imaging of the head and cervical spine was performed following the standard protocol without intravenous contrast. Multiplanar CT image reconstructions of the cervical spine were also generated. COMPARISON:  Head CT 10/07/2019 FINDINGS: CT HEAD FINDINGS Brain: Stable age related cerebral atrophy, ventriculomegaly and periventricular white matter disease. No extra-axial fluid collections are identified. No CT findings for acute hemispheric infarction or intracranial hemorrhage. No mass lesions. The brainstem and cerebellum are normal. Vascular: Stable vascular calcifications. No aneurysm or hyperdense vessels. Skull: No skull fracture or bone lesions. Sinuses/Orbits: The paranasal sinuses and mastoid air cells are clear. The globes are intact. Other: No scalp lesions or scalp hematoma. CT CERVICAL SPINE FINDINGS Alignment: Normal Skull base and vertebrae: No acute fracture. No primary bone lesion or focal pathologic process. Soft tissues and spinal canal: No prevertebral fluid or swelling. No visible canal hematoma. Disc levels: The spinal canal is fairly generous. No large disc protrusions, spinal or foraminal stenosis. Upper chest: The visualized lung apices are grossly  clear. Calcified granuloma noted at the left lung apex. Other: Bilateral carotid artery calcifications. IMPRESSION: 1. Stable age related cerebral atrophy, ventriculomegaly and periventricular white matter disease. 2. No acute intracranial findings or skull fracture. 3. Normal alignment of the cervical vertebral bodies and no acute cervical spine fracture. Electronically Signed   By: Orlene Plum.D.  On: 09/27/2020 14:08   CT Cervical Spine Wo Contrast  Result Date: 09/27/2020 CLINICAL DATA:  Syncopal episode.  Fell. EXAM: CT HEAD WITHOUT CONTRAST CT CERVICAL SPINE WITHOUT CONTRAST TECHNIQUE: Multidetector CT imaging of the head and cervical spine was performed following the standard protocol without intravenous contrast. Multiplanar CT image reconstructions of the cervical spine were also generated. COMPARISON:  Head CT 10/07/2019 FINDINGS: CT HEAD FINDINGS Brain: Stable age related cerebral atrophy, ventriculomegaly and periventricular white matter disease. No extra-axial fluid collections are identified. No CT findings for acute hemispheric infarction or intracranial hemorrhage. No mass lesions. The brainstem and cerebellum are normal. Vascular: Stable vascular calcifications. No aneurysm or hyperdense vessels. Skull: No skull fracture or bone lesions. Sinuses/Orbits: The paranasal sinuses and mastoid air cells are clear. The globes are intact. Other: No scalp lesions or scalp hematoma. CT CERVICAL SPINE FINDINGS Alignment: Normal Skull base and vertebrae: No acute fracture. No primary bone lesion or focal pathologic process. Soft tissues and spinal canal: No prevertebral fluid or swelling. No visible canal hematoma. Disc levels: The spinal canal is fairly generous. No large disc protrusions, spinal or foraminal stenosis. Upper chest: The visualized lung apices are grossly clear. Calcified granuloma noted at the left lung apex. Other: Bilateral carotid artery calcifications. IMPRESSION: 1. Stable age  related cerebral atrophy, ventriculomegaly and periventricular white matter disease. 2. No acute intracranial findings or skull fracture. 3. Normal alignment of the cervical vertebral bodies and no acute cervical spine fracture. Electronically Signed   By: Rudie Meyer M.D.   On: 09/27/2020 14:08   DG Chest Portable 1 View  Result Date: 09/27/2020 CLINICAL DATA:  Syncope EXAM: PORTABLE CHEST 1 VIEW COMPARISON:  10/07/2019 FINDINGS: Mild cardiomegaly. Large hiatal hernia. Both lungs are clear. The visualized skeletal structures are unremarkable. IMPRESSION: 1. Mild cardiomegaly without acute abnormality of the lungs in AP portable projection. 2. Large hiatal hernia. Electronically Signed   By: Lauralyn Primes M.D.   On: 09/27/2020 14:54      Assessment/Plan  Syncope secondary to hypotension -Patient with dementia and no other history from nursing facility other than that they found her down on the floor. Unclear if she is having any decrease PO intake or GI issues -check CK -Has received 1 L normal saline bolus in the ED.  Continuous IV normal saline 75 cc fluid -Hold all antihypertensives  Elevated troponin -suspect demand ischemia from hypotension -Last echocardiogram on 10/2017 with a EF of 55 to 65% and mild mitral valve regurgitation -Repeat echo and placed on continuous telemetry  Mild Hypocalcemia - Corrected calcium of 8.5 - check Mg    Lactic acidosis - Suspect secondary to hypoperfusion from hypotension  Dementia -pt alert and oriented only to self  -Has 1 to 1 sitter at bedside for agitation and pulling on IV lines -continue all home meds  Hypothyroidism -Continue levothyroxine  DVT prophylaxis:.Lovenox Code Status: Full- no DNR paperwork at bedside althought she was DNR in previous admission. Will need to check with SNF in the morning to confirm. Family Communication: No family at bedside disposition Plan: Home with observation Consults called:  Admission status:  Observation Level of care: Med-Surg  Status is: Observation  The patient remains OBS appropriate and will d/c before 2 midnights.  Dispo: The patient is from: SNF              Anticipated d/c is to: SNF              Patient currently is not medically  stable to d/c.   Difficult to place patient No         Anselm Jungling DO [facility]   If 7PM-7AM, please contact night-coverage [email or web address]   09/27/2020, 7:46 PM

## 2020-09-27 NOTE — ED Notes (Signed)
This tech walked by pt rm and noticed pt was standing at the end of the bed with wires pulling from the cardiac monitor. This tech went in to assist pt back to bed. Pt confused and not oriented to place. Pt stated, "I am not supposed to be here." This tech informed pt that she was in the emergency room. Heather, RN made aware and asked this pt to sit 1:1 with pt for safety.

## 2020-09-27 NOTE — ED Triage Notes (Signed)
Pt presents to the [facility] via EMS from [facility] with c/o syncopal episode and fall. EMS states that staff found pt on the floor and assisted her to her feet when she had syncopal episode. EMS reports systolic blood pressure in the 70's upon arrival to facility. Pt alert but only oriented to self. SNF staff report hx of dementia and currently at baseline.

## 2020-09-28 ENCOUNTER — Observation Stay: Admit: 2020-09-28 | Payer: Medicare Other

## 2020-09-28 ENCOUNTER — Observation Stay
Admit: 2020-09-28 | Discharge: 2020-09-28 | Disposition: A | Discharge status: Home / Self Care | Payer: Medicare Other | Attending: Family Medicine | Admitting: Family Medicine

## 2020-09-28 DIAGNOSIS — R778 Other specified abnormalities of plasma proteins: Secondary | ICD-10-CM

## 2020-09-28 DIAGNOSIS — E785 Hyperlipidemia, unspecified: Secondary | ICD-10-CM | POA: Diagnosis present

## 2020-09-28 DIAGNOSIS — E039 Hypothyroidism, unspecified: Secondary | ICD-10-CM | POA: Diagnosis present

## 2020-09-28 DIAGNOSIS — E861 Hypovolemia: Secondary | ICD-10-CM

## 2020-09-28 DIAGNOSIS — Z20822 Contact with and (suspected) exposure to covid-19: Secondary | ICD-10-CM | POA: Diagnosis present

## 2020-09-28 DIAGNOSIS — F0390 Unspecified dementia without behavioral disturbance: Secondary | ICD-10-CM | POA: Diagnosis present

## 2020-09-28 DIAGNOSIS — E860 Dehydration: Secondary | ICD-10-CM

## 2020-09-28 DIAGNOSIS — Z8673 Personal history of transient ischemic attack (TIA), and cerebral infarction without residual deficits: Secondary | ICD-10-CM | POA: Diagnosis not present

## 2020-09-28 DIAGNOSIS — D649 Anemia, unspecified: Secondary | ICD-10-CM | POA: Diagnosis present

## 2020-09-28 DIAGNOSIS — K449 Diaphragmatic hernia without obstruction or gangrene: Secondary | ICD-10-CM

## 2020-09-28 DIAGNOSIS — I2510 Atherosclerotic heart disease of native coronary artery without angina pectoris: Secondary | ICD-10-CM | POA: Diagnosis present

## 2020-09-28 DIAGNOSIS — E876 Hypokalemia: Secondary | ICD-10-CM | POA: Diagnosis present

## 2020-09-28 DIAGNOSIS — I10 Essential (primary) hypertension: Secondary | ICD-10-CM | POA: Diagnosis present

## 2020-09-28 DIAGNOSIS — I959 Hypotension, unspecified: Secondary | ICD-10-CM | POA: Diagnosis present

## 2020-09-28 DIAGNOSIS — Z96652 Presence of left artificial knee joint: Secondary | ICD-10-CM | POA: Diagnosis present

## 2020-09-28 DIAGNOSIS — I440 Atrioventricular block, first degree: Secondary | ICD-10-CM | POA: Diagnosis present

## 2020-09-28 DIAGNOSIS — R000 Tachycardia, unspecified: Secondary | ICD-10-CM | POA: Diagnosis present

## 2020-09-28 DIAGNOSIS — Z885 Allergy status to narcotic agent status: Secondary | ICD-10-CM | POA: Diagnosis not present

## 2020-09-28 DIAGNOSIS — I9589 Other hypotension: Secondary | ICD-10-CM | POA: Diagnosis not present

## 2020-09-28 DIAGNOSIS — Z888 Allergy status to other drugs, medicaments and biological substances status: Secondary | ICD-10-CM | POA: Diagnosis not present

## 2020-09-28 DIAGNOSIS — Z96641 Presence of right artificial hip joint: Secondary | ICD-10-CM | POA: Diagnosis present

## 2020-09-28 DIAGNOSIS — E872 Acidosis: Secondary | ICD-10-CM

## 2020-09-28 DIAGNOSIS — Z8711 Personal history of peptic ulcer disease: Secondary | ICD-10-CM | POA: Diagnosis not present

## 2020-09-28 DIAGNOSIS — I248 Other forms of acute ischemic heart disease: Secondary | ICD-10-CM | POA: Diagnosis present

## 2020-09-28 DIAGNOSIS — R55 Syncope and collapse: Secondary | ICD-10-CM | POA: Diagnosis present

## 2020-09-28 DIAGNOSIS — Z882 Allergy status to sulfonamides status: Secondary | ICD-10-CM | POA: Diagnosis not present

## 2020-09-28 LAB — ECHOCARDIOGRAM COMPLETE
AR max vel: 2.44 cm2
AV Area VTI: 2.62 cm2
AV Area mean vel: 2.59 cm2
AV Mean grad: 2 mmHg
AV Peak grad: 3.9 mmHg
Ao pk vel: 0.99 m/s
Area-P 1/2: 6.71 cm2
Height: 66 in
S' Lateral: 2.79 cm
Weight: 2059.2 oz

## 2020-09-28 LAB — BASIC METABOLIC PANEL
Anion gap: 5 (ref 5–15)
BUN: 9 mg/dL (ref 8–23)
CO2: 30 mmol/L (ref 22–32)
Calcium: 8.3 mg/dL — ABNORMAL LOW (ref 8.9–10.3)
Chloride: 100 mmol/L (ref 98–111)
Creatinine, Ser: 0.56 mg/dL (ref 0.44–1.00)
GFR, Estimated: >60 mL/min (ref >60)
Glucose, Bld: 87 mg/dL (ref 70–99)
Potassium: 3.1 mmol/L — ABNORMAL LOW (ref 3.5–5.1)
Sodium: 135 mmol/L (ref 135–145)

## 2020-09-28 LAB — MRSA PCR SCREENING: MRSA by PCR: NEGATIVE

## 2020-09-28 LAB — SARS CORONAVIRUS 2 (TAT 6-24 HRS): SARS Coronavirus 2: NEGATIVE

## 2020-09-28 MED ORDER — SODIUM CHLORIDE 0.9 % IV SOLN: INTRAVENOUS | Status: DC

## 2020-09-28 MED ORDER — POTASSIUM CHLORIDE CRYS ER 20 MEQ PO TBCR
40.0000 meq | EXTENDED_RELEASE_TABLET | Freq: Once | ORAL | Status: AC
  Administered 2020-09-28: 40 meq via ORAL
  Filled 2020-09-28: qty 2

## 2020-09-28 MED ORDER — HYDROXYZINE HCL 10 MG PO TABS
10.0000 mg | ORAL_TABLET | ORAL | Status: DC | PRN
  Filled 2020-09-28 (×2): qty 1

## 2020-09-28 NOTE — Progress Notes (Signed)
   09/28/20 1730  Clinical Encounter Type  Visited With Patient  Visit Type Initial;Spiritual support;Social support  Referral From Nurse  Consult/Referral To [place] responded to a spiritual consult. Chaplain met with PT at bedside. PT had a sitter that was in the room with her at this time, to give her some level of support. PT was able to express her emotion. PT stated she lost everything financially and felt like she was all alone in the world. Chaplain supported PT, and encouraged her with intentional presence, and reflective listening. Chaplain prayed with PT, which made PT calm.

## 2020-09-28 NOTE — Progress Notes (Addendum)
PROGRESS NOTE    Kerry Cabrera  CVE:[id number] DOB: 28-Jul-1928 DOA: 09/27/2020 PCP: Almetta Lovely, [facility]   Chief Complaint  Patient presents with   Loss of Consciousness   Fall    Brief Narrative:  HPI per Dr. Doran Heater is a 87 y.o. female with medical history significant for dementia, CAD, TIA, hypothyroidism, history of avascular necrosis of the hip, and depression who presents from her skilled nursing facility with concerns of syncope.  Patient only alert and oriented to herself and unable provide any history.  History obtained per ED documentation.  Reportedly patient had a syncopal episode and staff found her on the floor.  EMS reports systolic blood pressure of 70s upon arrival to facility.  Her blood pressure in the ER was 90/60 with improvement up to 116/60 1 liter of normal saline bolus.  She was also tachycardic up to 120 with EKG showing first-degree AV block. CBC showed no leukocytosis, mild anemia with hemoglobin of 11.8.  Sodium of 131.  BG of 108.  Corrected calcium of 8.5.  Troponin initially of 12 and increased to 37.  Lactate of 2.4.  Negative UA.  Negative CT head and cervical spine.  Assessment & Plan:   Principal Problem:   Hypotension Active Problems:   Acquired hypothyroidism   Syncope, vasovagal   Hypocalcemia   Lactic acidosis   Dementia without behavioral disturbance (HCC)   Dehydration   Syncope   Elevated troponin   #1 syncope secondary to hypotension  -Patient with history of underlying dementia no other history obtained from nursing facility noted to have been found down unclear what is oral intake versus GI losses. -Noted per admitting H&P that EMS noted systolic blood pressures in the 70s on presentation. -CTA head and C-spine negative for any acute abnormalities per -No arrhythmias noted on telemetry. -Blood pressure improved with hydration. -Continue to hold antihypertensive medications. -2D echo pending. -Supportive  care. -Follow.  2.  Elevated troponin -Felt secondary to demand ischemia from hypotension. -Patient asymptomatic. -EKG with no ischemic changes noted. -Repeat 2D echo ordered and pending.  3.  Lactic acidosis  -Secondary to dehydration. -Repeat lactic acid level tomorrow after appropriate hydration.  Follow.  4.  Hypothyroidism -Continue Synthroid.  5.  Dementia -Patient alert and oriented to self. -Continue one-to-one sitter. -Continue Depakote, Aricept.  6.  hiatal hernia -Place on PPI.  Continue Carafate.  7.  Hypokalemia -Replete.     DVT prophylaxis: Lovenox Code Status: Full Family Communication: Updated patient.  No family at bedside. Disposition:   Status is: Observation  The patient will require care spanning > 2 midnights and should be moved to inpatient because: Ongoing diagnostic testing needed not appropriate for outpatient work up  Dispo: The patient is from: SNF              Anticipated d/c is to: SNF              Patient currently is not medically stable to d/c.   Difficult to place patient No       Consultants:  None  Procedures:  CT head 09/27/2020 CT C-spine 09/27/2020 Chest x-ray 09/27/2020 2D echo 09/28/2020  Antimicrobials:  None   Subjective: Laying in bed.  Currently getting an echocardiogram being done.  Denies any chest pain.  No shortness of breath.  Objective: Vitals:   09/28/20 0533 09/28/20 0758 09/28/20 1147 09/28/20 1719  BP: (!) 154/56 (!) 156/79 (!) 152/73 140/69  Pulse: 69 67 65 78  Resp: 16 18 18 15   Temp: 98 F (36.7 C) 97.8 F (36.6 C) 97.8 F (36.6 C) 98.6 F (37 C)  TempSrc:      SpO2: 97% 98% 98% 98%  Weight:      Height:        Intake/Output Summary (Last 24 hours) at 09/28/2020 1746 Last data filed at 09/28/2020 1359 Gross per 24 hour  Intake 742.5 ml  Output --  Net 742.5 ml   Filed Weights   09/27/20 1300 09/28/20 0145  Weight: 60 kg 58.4 kg    Examination:  General exam: Appears calm  and comfortable  Respiratory system: Clear to auscultation. Respiratory effort normal. Cardiovascular system: S1 & S2 heard, RRR. No JVD, murmurs, rubs, gallops or clicks. No pedal edema. Gastrointestinal system: Abdomen is nondistended, soft and nontender. No organomegaly or masses felt. Normal bowel sounds heard. Central nervous system: Alert and oriented. No focal neurological deficits. Extremities: Symmetric 5 x 5 power. Skin: No rashes, lesions or ulcers Psychiatry: Judgement and insight appear normal. Mood & affect appropriate.     Data Reviewed: I have personally reviewed following labs and imaging studies  CBC: Recent Labs  Lab 09/27/20 1322  WBC 5.1  HGB 11.8*  HCT 35.2*  MCV 91.0  PLT 218    Basic Metabolic Panel: Recent Labs  Lab 09/27/20 1322 09/27/20 1537 09/28/20 0445  NA 131*  --  135  K 3.9  --  3.1*  CL 97*  --  100  CO2 23  --  30  GLUCOSE 108*  --  87  BUN 9  --  9  CREATININE 0.64  --  0.56  CALCIUM 7.6*  --  8.3*  MG  --  1.9  --     GFR: Estimated Creatinine Clearance: 44 mL/min (by C-G formula based on SCr of 0.56 mg/dL).  Liver Function Tests: Recent Labs  Lab 09/27/20 1322  AST 30  ALT <5  ALKPHOS 42  BILITOT 1.1  PROT 4.5*  ALBUMIN 2.4*    CBG: No results for input(s): GLUCAP in the last 168 hours.   Recent Results (from the past 240 hour(s))  SARS CORONAVIRUS 2 (TAT 6-24 HRS) Nasopharyngeal Nasopharyngeal Swab     Status: None   Collection Time: 09/27/20  7:37 PM   Specimen: Nasopharyngeal Swab  Result Value Ref Range Status   SARS Coronavirus 2 NEGATIVE NEGATIVE Final    Comment: (NOTE) SARS-CoV-2 target nucleic acids are NOT DETECTED.  The SARS-CoV-2 RNA is generally detectable in upper and lower respiratory specimens during the acute phase of infection. Negative results do not preclude SARS-CoV-2 infection, do not rule out co-infections with other pathogens, and should not be used as the sole basis for treatment or  other patient management decisions. Negative results must be combined with clinical observations, patient history, and epidemiological information. The expected result is Negative.  Fact Sheet for Patients: 09/29/20  Fact Sheet for Healthcare Providers: HairSlick.no  This test is not yet approved or cleared by the quierodirigir.com FDA and  has been authorized for detection and/or diagnosis of SARS-CoV-2 by FDA under an Emergency Use Authorization (EUA). This EUA will remain  in effect (meaning this test can be used) for the duration of the COVID-19 declaration under Se ction 564(b)(1) of the Act, 21 U.S.C. section 360bbb-3(b)(1), unless the authorization is terminated or revoked sooner.  Performed at [facility], [address] [place], [place]   MRSA PCR Screening  Status: None   Collection Time: 09/28/20  3:27 PM  Result Value Ref Range Status   MRSA by PCR NEGATIVE NEGATIVE Final    Comment:        The GeneXpert MRSA Assay (FDA approved for NASAL specimens only), is one component of a comprehensive MRSA colonization surveillance program. It is not intended to diagnose MRSA infection nor to guide or monitor treatment for MRSA infections. Performed at [facility], [address], [place], [place] [address]          Radiology Studies: CT Head Wo Contrast  Result Date: 09/27/2020 CLINICAL DATA:  Syncopal episode.  Fell. EXAM: CT HEAD WITHOUT CONTRAST CT CERVICAL SPINE WITHOUT CONTRAST TECHNIQUE: Multidetector CT imaging of the head and cervical spine was performed following the standard protocol without intravenous contrast. Multiplanar CT image reconstructions of the cervical spine were also generated. COMPARISON:  Head CT 10/07/2019 FINDINGS: CT HEAD FINDINGS Brain: Stable age related cerebral atrophy, ventriculomegaly and periventricular white matter disease. No extra-axial  fluid collections are identified. No CT findings for acute hemispheric infarction or intracranial hemorrhage. No mass lesions. The brainstem and cerebellum are normal. Vascular: Stable vascular calcifications. No aneurysm or hyperdense vessels. Skull: No skull fracture or bone lesions. Sinuses/Orbits: The paranasal sinuses and mastoid air cells are clear. The globes are intact. Other: No scalp lesions or scalp hematoma. CT CERVICAL SPINE FINDINGS Alignment: Normal Skull base and vertebrae: No acute fracture. No primary bone lesion or focal pathologic process. Soft tissues and spinal canal: No prevertebral fluid or swelling. No visible canal hematoma. Disc levels: The spinal canal is fairly generous. No large disc protrusions, spinal or foraminal stenosis. Upper chest: The visualized lung apices are grossly clear. Calcified granuloma noted at the left lung apex. Other: Bilateral carotid artery calcifications. IMPRESSION: 1. Stable age related cerebral atrophy, ventriculomegaly and periventricular white matter disease. 2. No acute intracranial findings or skull fracture. 3. Normal alignment of the cervical vertebral bodies and no acute cervical spine fracture. Electronically Signed   By: Rudie Meyer M.D.   On: 09/27/2020 14:08   CT Cervical Spine Wo Contrast  Result Date: 09/27/2020 CLINICAL DATA:  Syncopal episode.  Fell. EXAM: CT HEAD WITHOUT CONTRAST CT CERVICAL SPINE WITHOUT CONTRAST TECHNIQUE: Multidetector CT imaging of the head and cervical spine was performed following the standard protocol without intravenous contrast. Multiplanar CT image reconstructions of the cervical spine were also generated. COMPARISON:  Head CT 10/07/2019 FINDINGS: CT HEAD FINDINGS Brain: Stable age related cerebral atrophy, ventriculomegaly and periventricular white matter disease. No extra-axial fluid collections are identified. No CT findings for acute hemispheric infarction or intracranial hemorrhage. No mass lesions. The  brainstem and cerebellum are normal. Vascular: Stable vascular calcifications. No aneurysm or hyperdense vessels. Skull: No skull fracture or bone lesions. Sinuses/Orbits: The paranasal sinuses and mastoid air cells are clear. The globes are intact. Other: No scalp lesions or scalp hematoma. CT CERVICAL SPINE FINDINGS Alignment: Normal Skull base and vertebrae: No acute fracture. No primary bone lesion or focal pathologic process. Soft tissues and spinal canal: No prevertebral fluid or swelling. No visible canal hematoma. Disc levels: The spinal canal is fairly generous. No large disc protrusions, spinal or foraminal stenosis. Upper chest: The visualized lung apices are grossly clear. Calcified granuloma noted at the left lung apex. Other: Bilateral carotid artery calcifications. IMPRESSION: 1. Stable age related cerebral atrophy, ventriculomegaly and periventricular white matter disease. 2. No acute intracranial findings or skull fracture. 3. Normal alignment of the cervical vertebral bodies  and no acute cervical spine fracture. Electronically Signed   By: Rudie MeyerP.  Gallerani M.D.   On: 09/27/2020 14:08   DG Chest Portable 1 View  Result Date: 09/27/2020 CLINICAL DATA:  Syncope EXAM: PORTABLE CHEST 1 VIEW COMPARISON:  10/07/2019 FINDINGS: Mild cardiomegaly. Large hiatal hernia. Both lungs are clear. The visualized skeletal structures are unremarkable. IMPRESSION: 1. Mild cardiomegaly without acute abnormality of the lungs in AP portable projection. 2. Large hiatal hernia. Electronically Signed   By: Lauralyn PrimesAlex  Bibbey M.D.   On: 09/27/2020 14:54        Scheduled Meds:  acetaminophen  1,000 mg Oral BID   divalproex  250 mg Oral TID   donepezil  10 mg Oral QHS   enoxaparin (LOVENOX) injection  40 mg Subcutaneous Q24H   ferrous sulfate  325 mg Oral Q1200   gabapentin  100 mg Oral QHS   levothyroxine  137 mcg Oral QAC breakfast   melatonin  5 mg Oral QHS   pantoprazole  40 mg Oral Daily   sucralfate  1 g Oral  BID   traZODone  100 mg Oral QHS   Continuous Infusions:  sodium chloride       LOS: 0 days    Time spent: 40 minutes    Ramiro Harvestaniel Hopie Pellegrin, MD [facility]   To contact the attending provider between 7A-7P or the covering provider during after hours 7P-7A, please log into the web site [email or web address] and access using universal [place] password for that web site. If you do not have the password, please call the hospital operator.  09/28/2020, 5:46 PM

## 2020-09-28 NOTE — Evaluation (Signed)
Physical Therapy Evaluation Patient Details Name: Kerry Cabrera MRN: [id number] DOB: [birth year] Today's Date: 09/28/2020   History of Present Illness  Patient is an 87 year old female who presents from SNF ([facility]) with concerns for syncope. Patient was found on the floor. EKG shows tachycardia with first degree AV block.  PMH includes dementia, CAD, TIA, hypothyroidism, avascular necrosis of hip, depression, TIA, glaucoma, HTN.  Clinical Impression  Patient is a pleasantly confused 87 year old female who presents in bed upon PT arrival. Patient is from [facility] and has altered mental status at baseline. Her subjective obtained through chart review as patient is oriented to name but not situation or location. She is eager to get out of bed upon PT arrival to use restroom. Transfers to EOB and standing with CGA/supervision due to limited/no safety awareness. Requires use of RW for ambulation to restroom and occasional min A for use of RW due to forgetting how to use AD correctly. Toilets with supervision, self cleans but needs direction for washing hands. Ambulates back to bed with min A/CGA for safety. Performs mini squats at EOB without LOB holding onto RW. Performs standing marches at EOB with RW. Reports fatigue and desire to return to bed. Returned to bed, railing re-raised and sitter present with all needs met. Patient will benefit from skilled physical therapy to address safety deficits, balance, and mobility to decrease fall risk. Upon discharge patient will benefit from SNF due to high fall risk and risk for re-admission due to cognition affecting safety.     Follow Up Recommendations SNF    Equipment Recommendations  None recommended by PT    Recommendations for Other Services       Precautions / Restrictions Precautions Precautions: Fall Precaution Comments: confused mental status, forgets she needs walker. Restrictions Weight Bearing  Restrictions: No      Mobility  Bed Mobility Overal bed mobility: Modified Independent             General bed mobility comments: patient requires extra time due to confusion and requires close SBA due to confusion/lack of safety awareness.    Transfers Overall transfer level: Modified independent Equipment used: Rolling walker (2 wheeled)             General transfer comment: Patient requires CGA due to lack of safety awareness, physically is able to perform.  Ambulation/Gait Ambulation/Gait assistance: Min guard;Min assist (occasional min A to utilize walker due to forgetting how to use it.) Gait Distance (Feet): 35 Feet Assistive device: Rolling walker (2 wheeled)   Gait velocity: decreased   General Gait Details: Patient occasionally forgets she is using walker/how to use walker requiring min A for AD. Slight trunk flexion and narrow BOS.  Stairs            Wheelchair Mobility    Modified Rankin (Stroke Patients Only)       Balance Overall balance assessment: Needs assistance Sitting-balance support: Feet supported;No upper extremity supported Sitting balance-Leahy Scale: Fair Sitting balance - Comments: posterior trunk lean with lower extremity movement. Postural control: Posterior lean Standing balance support: Bilateral upper extremity supported;During functional activity Standing balance-Leahy Scale: Fair Standing balance comment: requires use of RW for ambulation. Able to static stand and wash hands while leaning onto counter.                             Pertinent Vitals/Pain Pain Assessment: No/denies pain (  Patient reports no pain in body but has pain in head due to confusion.)    Home Living Family/patient expects to be discharged to:: Skilled nursing facility                 Additional Comments: Patient presents to hospital from [facility], will benefit from return to SNF.    Prior Function Level of  Independence: Needs assistance   Gait / Transfers Assistance Needed: uses RW  ADL's / Homemaking Assistance Needed: patient unable to answer  Comments: Per chart patient lives at SNF, uses a RW but needs assistance due to altered mental status.     Hand Dominance        Extremity/Trunk Assessment   Upper Extremity Assessment Upper Extremity Assessment: Generalized weakness (grossly 3+/5)    Lower Extremity Assessment Lower Extremity Assessment: Generalized weakness (Grossly 4-/5 bilaterally.)    Cervical / Trunk Assessment Cervical / Trunk Assessment: Normal  Communication   Communication: No difficulties  Cognition Arousal/Alertness: Awake/alert Behavior During Therapy: Impulsive Overall Cognitive Status: History of cognitive impairments - at baseline                                 General Comments: Per chart review, facility reports patient is at her baseline cognition-altered. Oriented to self, not oriented to place or reason for hospitalization.      General Comments General comments (skin integrity, edema, etc.): Patient appears frail but eager to get out of bed.    Exercises General Exercises - Lower Extremity Hip Flexion/Marching: Strengthening;Both;10 reps;Standing Mini-Sqauts: Strengthening;5 reps;Standing Other Exercises Other Exercises: Patient educated on PT role in acute care setting, transfer safety and use of AD. Ambulation to restroom for toileting with patient performing toileting with supervision. Returned to bed to perform standing strengthening interventions of mini squats and high knee marches.   Assessment/Plan    PT Assessment Patient needs continued PT services  PT Problem List Decreased strength;Decreased activity tolerance;Decreased balance;Decreased knowledge of use of DME;Decreased cognition;Decreased mobility;Decreased safety awareness       PT Treatment Interventions DME instruction;Gait training;Functional mobility  training;Therapeutic activities;Patient/family education;Neuromuscular re-education;Balance training;Therapeutic exercise;Manual techniques    PT Goals (Current goals can be found in the Care Plan section)  Acute Rehab PT Goals Patient Stated Goal: patient unable to participate in goal setting due to confusion/altered mental status PT Goal Formulation: Patient unable to participate in goal setting    Frequency Min 2X/week   Barriers to discharge   will benefit from return to SNF    Co-evaluation               AM-PAC PT "6 Clicks" Mobility  Outcome Measure Help needed turning from your back to your side while in a flat bed without using bedrails?: A Little Help needed moving from lying on your back to sitting on the side of a flat bed without using bedrails?: A Little Help needed moving to and from a bed to a chair (including a wheelchair)?: A Little Help needed standing up from a chair using your arms (e.g., wheelchair or bedside chair)?: A Little Help needed to walk in hospital room?: A Little Help needed climbing 3-5 steps with a railing? : A Lot 6 Click Score: 17    End of Session Equipment Utilized During Treatment: Gait belt Activity Tolerance: Patient tolerated treatment well Patient left: in bed;with call bell/phone within reach;with nursing/sitter in room Nurse Communication: Mobility  status PT Visit Diagnosis: Unsteadiness on feet (R26.81);Other abnormalities of gait and mobility (R26.89);Repeated falls (R29.6);History of falling (Z91.81)    Time: 8241-7530 PT Time Calculation (min) (ACUTE ONLY): 13 min   Charges:   PT Evaluation $PT Eval Moderate Complexity: 1 Mod          Janna Arch, PT, DPT  09/28/2020, 11:51 AM

## 2020-09-28 NOTE — Evaluation (Signed)
Occupational Therapy Evaluation Patient Details Name: Kerry Cabrera MRN: [id number] DOB: 01/05/1929 Today's Date: 09/28/2020    History of Present Illness Patient is an 87 year old female who presents from SNF ([facility]) with concerns for syncope. Patient was found on the floor. EKG shows tachycardia with first degree AV block.  PMH includes dementia, CAD, TIA, hypothyroidism, avascular necrosis of hip, depression, TIA, glaucoma, HTN.   Clinical Impression    Patient presenting with decreased I in self care, balance, functional mobility/transfers, endurance, and safety awareness. Patient with increased confusion but pleasant throughout. Pt oriented to self only. When asked questions about PLOF she responds with she lives at her home and is independent. Per chart review, pt using RW with assistance as needed. Patient currently functioning at min HHA and refusal to use RW for toileting, hygiene, and standing while performing grooming tasks. Patient will benefit from acute OT to increase overall independence in the areas of ADLs, functional mobility, and safety awareness in order to safely discharge to next venue of care.  Follow Up Recommendations  Supervision/Assistance - 24 hour;SNF    Equipment Recommendations  None recommended by OT       Precautions / Restrictions Precautions Precautions: Fall Precaution Comments: confused mental status, forgets she needs walker. Restrictions Weight Bearing Restrictions: No      Mobility Bed Mobility Overal bed mobility: Needs Assistance Bed Mobility: Supine to Sit;Sit to Supine     Supine to sit: Supervision Sit to supine: Supervision   General bed mobility comments: no physical assist with cuing for technique and safety    Transfers Overall transfer level: Needs assistance Equipment used: 1 person hand held assist Transfers: Sit to/from Stand;Stand Pivot Transfers Sit to Stand: Min assist Stand pivot transfers: Min  assist       General transfer comment: Patient requires CGA due to lack of safety awareness, physically is able to perform.    Balance Overall balance assessment: Needs assistance Sitting-balance support: Feet supported;No upper extremity supported Sitting balance-Leahy Scale: Fair Sitting balance - Comments: posterior trunk lean with lower extremity movement. Postural control: Posterior lean Standing balance support: Bilateral upper extremity supported;During functional activity Standing balance-Leahy Scale: Fair Standing balance comment: UE support                           ADL either performed or assessed with clinical judgement   ADL Overall ADL's : Needs assistance/impaired     Grooming: Wash/dry hands;Wash/dry face;Oral care;Standing;Min guard                       Toileting- Clothing Manipulation and Hygiene: Minimal assistance;Sit to/from stand   Tub/ Shower Transfer: Minimal assistance;Ambulation   Functional mobility during ADLs: Minimal assistance;Min guard       Vision Patient Visual Report: No change from baseline              Pertinent Vitals/Pain Pain Assessment: No/denies pain     Hand Dominance Right   Extremity/Trunk Assessment Upper Extremity Assessment Upper Extremity Assessment: Generalized weakness   Lower Extremity Assessment Lower Extremity Assessment: Generalized weakness   Cervical / Trunk Assessment Cervical / Trunk Assessment: Normal   Communication Communication Communication: No difficulties   Cognition Arousal/Alertness: Awake/alert Behavior During Therapy: Impulsive Overall Cognitive Status: History of cognitive impairments - at baseline  General Comments: Per chart review, facility reports patient is at her baseline cognition-altered. Oriented to self, not oriented to place or reason for hospitalization.   General Comments  Patient appears frail but eager  to get out of bed.              Home Living Family/patient expects to be discharged to:: Skilled nursing facility                                 Additional Comments: Patient presents to hospital from [facility]      Prior Functioning/Environment Level of Independence: Needs assistance  Gait / Transfers Assistance Needed: uses RW ADL's / Homemaking Assistance Needed: patient unable to answer   Comments: Per chart patient lives at ALF, uses a RW but needs assistance due to altered mental status. She reports she performs her own self care tasks!        OT Problem List: Decreased strength;Decreased activity tolerance;Impaired balance (sitting and/or standing);Decreased safety awareness;Pain;Decreased knowledge of use of DME or AE;Decreased knowledge of precautions      OT Treatment/Interventions: Self-care/ADL training;Manual therapy;Therapeutic exercise;Patient/family education;Balance training;Energy conservation;Therapeutic activities;Cognitive remediation/compensation    OT Goals(Current goals can be found in the care plan section) Acute Rehab OT Goals Patient Stated Goal: none reported - confusion OT Goal Formulation: Patient unable to participate in goal setting Time For Goal Achievement: 10/12/20 Potential to Achieve Goals: Fair ADL Goals Pt Will Perform Grooming: with supervision;standing Pt Will Perform Lower Body Dressing: with supervision;sit to/from stand Pt Will Transfer to Toilet: with supervision;ambulating Pt Will Perform Toileting - Clothing Manipulation and hygiene: with supervision;sit to/from stand  OT Frequency: Min 2X/week   Barriers to D/C:    none known at this time          AM-PAC OT "6 Clicks" Daily Activity     Outcome Measure Help from another person eating meals?: None Help from another person taking care of personal grooming?: A Little Help from another person toileting, which includes using toliet, bedpan,  or urinal?: A Little Help from another person bathing (including washing, rinsing, drying)?: A Little Help from another person to put on and taking off regular upper body clothing?: None Help from another person to put on and taking off regular lower body clothing?: A Little 6 Click Score: 20   End of Session Nurse Communication: Mobility status  Activity Tolerance: Patient tolerated treatment well Patient left: in bed;with call bell/phone within reach;with bed alarm set  OT Visit Diagnosis: Unsteadiness on feet (R26.81);Repeated falls (R29.6);Muscle weakness (generalized) (M62.81)                Time: 1245-8099 OT Time Calculation (min): 22 min Charges:  OT General Charges $OT Visit: 1 Visit OT Evaluation $OT Eval Moderate Complexity: 1 Mod OT Treatments $Self Care/Home Management : 8-22 mins  Jackquline Denmark, MS, OTR/L , CBIS ascom [phone]  09/28/20, 3:33 PM

## 2020-09-28 NOTE — TOC Progression Note (Signed)
Transition of Care [facility]) - Progression Note    Patient Details  Name: Kerry Cabrera MRN: [id number] Date of Birth: 05-02-1928  Transition of [facility]) CM/SW Contact  Caryn Section, RN Phone Number: 09/28/2020, 2:18 PM  Clinical Narrative:   TOC in to see patient in room.  Patient lives at [facility] in [place], and states she plans to go back there after discharge.  She states she has no concerns about her stay there.  She declined further TOC needs.  TOC contact information given.  TOC to follow to discharge.         Expected Discharge Plan and Services                                                 Social Determinants of Health (SDOH) Interventions    Readmission Risk Interventions Readmission Risk Prevention Plan 10/08/2019  Transportation Screening Complete  PCP or Specialist Appt within 3-5 Days Complete  HRI or Home Care Consult Complete  Social Work Consult for Recovery Care Planning/Counseling Complete  Palliative Care Screening Not Applicable  Medication Review Oceanographer) Referral to Pharmacy  Some recent data might be hidden

## 2020-09-28 NOTE — Progress Notes (Signed)
*  PRELIMINARY RESULTS* Echocardiogram 2D Echocardiogram has been performed.  Cristela Blue 09/28/2020, 1:34 PM

## 2020-09-29 DIAGNOSIS — I10 Essential (primary) hypertension: Secondary | ICD-10-CM

## 2020-09-29 LAB — CBC WITH DIFFERENTIAL/PLATELET
Abs Immature Granulocytes: 0.02 10*3/uL (ref 0.00–0.07)
Basophils Absolute: 0.1 10*3/uL (ref 0.0–0.1)
Basophils Relative: 1 %
Eosinophils Absolute: 0.1 10*3/uL (ref 0.0–0.5)
Eosinophils Relative: 1 %
HCT: 38.6 % (ref 36.0–46.0)
Hemoglobin: 13 g/dL (ref 12.0–15.0)
Immature Granulocytes: 0 %
Lymphocytes Relative: 35 %
Lymphs Abs: 1.6 10*3/uL (ref 0.7–4.0)
MCH: 30.7 pg (ref 26.0–34.0)
MCHC: 33.7 g/dL (ref 30.0–36.0)
MCV: 91 fL (ref 80.0–100.0)
Monocytes Absolute: 0.4 10*3/uL (ref 0.1–1.0)
Monocytes Relative: 9 %
Neutro Abs: 2.5 10*3/uL (ref 1.7–7.7)
Neutrophils Relative %: 54 %
Platelets: 221 10*3/uL (ref 150–400)
RBC: 4.24 MIL/uL (ref 3.87–5.11)
RDW: 13.4 % (ref 11.5–15.5)
WBC: 4.6 10*3/uL (ref 4.0–10.5)
nRBC: 0 % (ref 0.0–0.2)

## 2020-09-29 LAB — BASIC METABOLIC PANEL
Anion gap: 7 (ref 5–15)
BUN: 6 mg/dL — ABNORMAL LOW (ref 8–23)
CO2: 29 mmol/L (ref 22–32)
Calcium: 8.6 mg/dL — ABNORMAL LOW (ref 8.9–10.3)
Chloride: 102 mmol/L (ref 98–111)
Creatinine, Ser: 0.57 mg/dL (ref 0.44–1.00)
GFR, Estimated: >60 mL/min (ref >60)
Glucose, Bld: 90 mg/dL (ref 70–99)
Potassium: 3.7 mmol/L (ref 3.5–5.1)
Sodium: 138 mmol/L (ref 135–145)

## 2020-09-29 LAB — LACTIC ACID, PLASMA: Lactic Acid, Venous: 1.1 mmol/L (ref 0.5–1.9)

## 2020-09-29 LAB — MAGNESIUM: Magnesium: 2.2 mg/dL (ref 1.7–2.4)

## 2020-09-29 MED ORDER — AMLODIPINE BESYLATE 5 MG PO TABS
2.5000 mg | ORAL_TABLET | Freq: Every day | ORAL | Status: DC
  Administered 2020-09-29 – 2020-09-30 (×2): 2.5 mg via ORAL
  Filled 2020-09-29 (×2): qty 1

## 2020-09-29 NOTE — Progress Notes (Signed)
PROGRESS NOTE    Kerry Cabrera  ZOX:[id number] DOB: Aug 18, 1928 DOA: 09/27/2020 PCP: Almetta Lovely, [facility]   Chief Complaint  Patient presents with   Loss of Consciousness   Fall    Brief Narrative:  HPI per Dr. Doran Heater is a 87 y.o. female with medical history significant for dementia, CAD, TIA, hypothyroidism, history of avascular necrosis of the hip, and depression who presents from her skilled nursing facility with concerns of syncope.  Patient only alert and oriented to herself and unable provide any history.  History obtained per ED documentation.  Reportedly patient had a syncopal episode and staff found her on the floor.  EMS reports systolic blood pressure of 70s upon arrival to facility.  Her blood pressure in the ER was 90/60 with improvement up to 116/60 1 liter of normal saline bolus.  She was also tachycardic up to 120 with EKG showing first-degree AV block. CBC showed no leukocytosis, mild anemia with hemoglobin of 11.8.  Sodium of 131.  BG of 108.  Corrected calcium of 8.5.  Troponin initially of 12 and increased to 37.  Lactate of 2.4.  Negative UA.  Negative CT head and cervical spine.  Assessment & Plan:   Principal Problem:   Hypotension Active Problems:   Acquired hypothyroidism   Syncope, vasovagal   Hypocalcemia   Lactic acidosis   Dementia without behavioral disturbance (HCC)   Dehydration   Syncope   Elevated troponin   1 syncope secondary to hypotension  -Patient with history of underlying dementia no other history obtained from nursing facility noted to have been found down unclear what is oral intake versus GI losses. -Noted per admitting H&P that EMS noted systolic blood pressures in the 70s on presentation. -CTA head and C-spine negative for any acute abnormalities per -No arrhythmias noted on telemetry. -Blood pressure improved with hydration. -2D echo with a EF of 60 to 65%, normal aortic valvular structure, NWMA. -Continue to  hold antihypertensive medications.   -Follow.  2.  Elevated troponin -Felt secondary to demand ischemia from hypotension. -Patient asymptomatic. -EKG with no ischemic changes noted. -Repeat 2D echo with a EF of 60 to 65%, NWMA, normal aortic valve structure.   -No further work-up needed at this time.    3.  Lactic acidosis  -Secondary to dehydration. -Repeat lactic acid level down to 1.1 after hydration.   -Saline lock IV fluids.    4.  Hypothyroidism -Synthroid.  5.  Dementia -Patient alert and oriented to self.   -Continue Depakote, Aricept.   -Continue one-to-one sitter.    6.  hiatal hernia -Continue PPI, Carafate.   7.  Hypokalemia -Repleted.  8.  Hypertension -HCTZ discontinued secondary to problem #1.   -Start Norvasc 2.5 mg daily.     DVT prophylaxis: Lovenox Code Status: Full Family Communication: Updated patient.  No family at bedside. Disposition:   Status is: Observation  The patient will require care spanning > 2 midnights and should be moved to inpatient because: Ongoing diagnostic testing needed not appropriate for outpatient work up  Dispo: The patient is from: SNF              Anticipated d/c is to: SNF              Patient currently is not medically stable to d/c.   Difficult to place patient No       Consultants:  None  Procedures:  CT head 09/27/2020 CT C-spine 09/27/2020 Chest x-ray 09/27/2020  2D echo 09/28/2020  Antimicrobials:  None   Subjective: In bed.  Stated she ate breakfast.  Denies chest pain.  No shortness of breath.  No further syncopal episodes.  Feeling better.  Asking when she is going to be able to be discharged.  Objective: Vitals:   09/28/20 2148 09/29/20 0609 09/29/20 0837 09/29/20 1205  BP: 128/76 (!) 173/73 (!) 171/83 (!) 160/76  Pulse: 74 67 70 71  Resp: 20 17 16 16   Temp:  97.7 F (36.5 C) 97.7 F (36.5 C) 97.9 F (36.6 C)  TempSrc:   Oral   SpO2: 95% 97% 97% 96%  Weight:      Height:         Intake/Output Summary (Last 24 hours) at 09/29/2020 1244 Last data filed at 09/29/2020 1039 Gross per 24 hour  Intake 967.5 ml  Output 1850 ml  Net -882.5 ml    Filed Weights   09/27/20 1300 09/28/20 0145  Weight: 60 kg 58.4 kg    Examination:  General exam: : NAD Respiratory system: CTA B.  No wheezes, no rhonchi.  Speaking in full sentences.  Normal respiratory effort. Cardiovascular system: Regular rate and rhythm no murmurs rubs or gallops.  No JVD.  No lower extremity edema.  Gastrointestinal system: Abdomen soft, nontender, nondistended, positive bowel sounds.  No rebound.  No guarding. Central nervous system: Alert and oriented. No focal neurological deficits. Extremities: Symmetric 5 x 5 power. Skin: No rashes, lesions or ulcers Psychiatry: Judgement and insight appear normal. Mood & affect appropriate.  Data Reviewed: I have personally reviewed following labs and imaging studies  CBC: Recent Labs  Lab 09/27/20 1322 09/29/20 0543  WBC 5.1 4.6  NEUTROABS  --  2.5  HGB 11.8* 13.0  HCT 35.2* 38.6  MCV 91.0 91.0  PLT 218 221     Basic Metabolic Panel: Recent Labs  Lab 09/27/20 1322 09/27/20 1537 09/28/20 0445 09/29/20 0543  NA 131*  --  135 138  K 3.9  --  3.1* 3.7  CL 97*  --  100 102  CO2 23  --  30 29  GLUCOSE 108*  --  87 90  BUN 9  --  9 6*  CREATININE 0.64  --  0.56 0.57  CALCIUM 7.6*  --  8.3* 8.6*  MG  --  1.9  --  2.2     GFR: Estimated Creatinine Clearance: 44 mL/min (by C-G formula based on SCr of 0.57 mg/dL).  Liver Function Tests: Recent Labs  Lab 09/27/20 1322  AST 30  ALT <5  ALKPHOS 42  BILITOT 1.1  PROT 4.5*  ALBUMIN 2.4*     CBG: No results for input(s): GLUCAP in the last 168 hours.   Recent Results (from the past 240 hour(s))  SARS CORONAVIRUS 2 (TAT 6-24 HRS) Nasopharyngeal Nasopharyngeal Swab     Status: None   Collection Time: 09/27/20  7:37 PM   Specimen: Nasopharyngeal Swab  Result Value Ref Range  Status   SARS Coronavirus 2 NEGATIVE NEGATIVE Final    Comment: (NOTE) SARS-CoV-2 target nucleic acids are NOT DETECTED.  The SARS-CoV-2 RNA is generally detectable in upper and lower respiratory specimens during the acute phase of infection. Negative results do not preclude SARS-CoV-2 infection, do not rule out co-infections with other pathogens, and should not be used as the sole basis for treatment or other patient management decisions. Negative results must be combined with clinical observations, patient history, and epidemiological information. The expected result is Negative.  Fact Sheet for Patients: HairSlick.no  Fact Sheet for Healthcare Providers: quierodirigir.com  This test is not yet approved or cleared by the [place] FDA and  has been authorized for detection and/or diagnosis of SARS-CoV-2 by FDA under an Emergency Use Authorization (EUA). This EUA will remain  in effect (meaning this test can be used) for the duration of the COVID-19 declaration under Se ction 564(b)(1) of the Act, 21 U.S.C. section 360bbb-3(b)(1), unless the authorization is terminated or revoked sooner.  Performed at [facility], [address] [place], [place] [address]   MRSA PCR Screening     Status: None   Collection Time: 09/28/20  3:27 PM  Result Value Ref Range Status   MRSA by PCR NEGATIVE NEGATIVE Final    Comment:        The GeneXpert MRSA Assay (FDA approved for NASAL specimens only), is one component of a comprehensive MRSA colonization surveillance program. It is not intended to diagnose MRSA infection nor to guide or monitor treatment for MRSA infections. Performed at [facility], [address], [place], [place] [address]           Radiology Studies: CT Head Wo Contrast  Result Date: 09/27/2020 CLINICAL DATA:  Syncopal episode.  Fell. EXAM: CT HEAD WITHOUT CONTRAST CT CERVICAL SPINE  WITHOUT CONTRAST TECHNIQUE: Multidetector CT imaging of the head and cervical spine was performed following the standard protocol without intravenous contrast. Multiplanar CT image reconstructions of the cervical spine were also generated. COMPARISON:  Head CT 10/07/2019 FINDINGS: CT HEAD FINDINGS Brain: Stable age related cerebral atrophy, ventriculomegaly and periventricular white matter disease. No extra-axial fluid collections are identified. No CT findings for acute hemispheric infarction or intracranial hemorrhage. No mass lesions. The brainstem and cerebellum are normal. Vascular: Stable vascular calcifications. No aneurysm or hyperdense vessels. Skull: No skull fracture or bone lesions. Sinuses/Orbits: The paranasal sinuses and mastoid air cells are clear. The globes are intact. Other: No scalp lesions or scalp hematoma. CT CERVICAL SPINE FINDINGS Alignment: Normal Skull base and vertebrae: No acute fracture. No primary bone lesion or focal pathologic process. Soft tissues and spinal canal: No prevertebral fluid or swelling. No visible canal hematoma. Disc levels: The spinal canal is fairly generous. No large disc protrusions, spinal or foraminal stenosis. Upper chest: The visualized lung apices are grossly clear. Calcified granuloma noted at the left lung apex. Other: Bilateral carotid artery calcifications. IMPRESSION: 1. Stable age related cerebral atrophy, ventriculomegaly and periventricular white matter disease. 2. No acute intracranial findings or skull fracture. 3. Normal alignment of the cervical vertebral bodies and no acute cervical spine fracture. Electronically Signed   By: Rudie Meyer M.D.   On: 09/27/2020 14:08   CT Cervical Spine Wo Contrast  Result Date: 09/27/2020 CLINICAL DATA:  Syncopal episode.  Fell. EXAM: CT HEAD WITHOUT CONTRAST CT CERVICAL SPINE WITHOUT CONTRAST TECHNIQUE: Multidetector CT imaging of the head and cervical spine was performed following the standard protocol  without intravenous contrast. Multiplanar CT image reconstructions of the cervical spine were also generated. COMPARISON:  Head CT 10/07/2019 FINDINGS: CT HEAD FINDINGS Brain: Stable age related cerebral atrophy, ventriculomegaly and periventricular white matter disease. No extra-axial fluid collections are identified. No CT findings for acute hemispheric infarction or intracranial hemorrhage. No mass lesions. The brainstem and cerebellum are normal. Vascular: Stable vascular calcifications. No aneurysm or hyperdense vessels. Skull: No skull fracture or bone lesions. Sinuses/Orbits: The paranasal sinuses and mastoid air cells are clear. The globes are intact. Other:  No scalp lesions or scalp hematoma. CT CERVICAL SPINE FINDINGS Alignment: Normal Skull base and vertebrae: No acute fracture. No primary bone lesion or focal pathologic process. Soft tissues and spinal canal: No prevertebral fluid or swelling. No visible canal hematoma. Disc levels: The spinal canal is fairly generous. No large disc protrusions, spinal or foraminal stenosis. Upper chest: The visualized lung apices are grossly clear. Calcified granuloma noted at the left lung apex. Other: Bilateral carotid artery calcifications. IMPRESSION: 1. Stable age related cerebral atrophy, ventriculomegaly and periventricular white matter disease. 2. No acute intracranial findings or skull fracture. 3. Normal alignment of the cervical vertebral bodies and no acute cervical spine fracture. Electronically Signed   By: Rudie MeyerP.  Gallerani M.D.   On: 09/27/2020 14:08   DG Chest Portable 1 View  Result Date: 09/27/2020 CLINICAL DATA:  Syncope EXAM: PORTABLE CHEST 1 VIEW COMPARISON:  10/07/2019 FINDINGS: Mild cardiomegaly. Large hiatal hernia. Both lungs are clear. The visualized skeletal structures are unremarkable. IMPRESSION: 1. Mild cardiomegaly without acute abnormality of the lungs in AP portable projection. 2. Large hiatal hernia. Electronically Signed   By: Lauralyn PrimesAlex   Bibbey M.D.   On: 09/27/2020 14:54   ECHOCARDIOGRAM COMPLETE  Result Date: 09/28/2020    ECHOCARDIOGRAM REPORT   Patient Name:   Kerry GentileTI B [facility]well Date of Exam: 09/28/2020 Medical Rec #:  [id number]        Height:       66.0 in Accession #:    [id number]       Weight:       128.7 lb Date of Birth:  05/01/1928         BSA:          1.658 m Patient Age:    89 years         BP:           156/79 mmHg Patient Gender: F                HR:           65 bpm. Exam Location:  [facility] Procedure: 2D Echo, Cardiac Doppler and Color Doppler Indications:     Syncope R55  History:         Patient has prior history of Echocardiogram examinations, most                  recent 11/05/2017. CAD; Risk Factors:Hypertension and                  Dyslipidemia.  Sonographer:     Cristela BlueJerry Hege RDCS (AE) Referring Phys:  [id number] Sunny SchleinHING T TU Diagnosing Phys: Arnoldo HookerBruce Kowalski MD  Sonographer Comments: Suboptimal apical window and no subcostal window. IMPRESSIONS  1. Left ventricular ejection fraction, by estimation, is 60 to 65%. The left ventricle has normal function. The left ventricle has no regional wall motion abnormalities. Left ventricular diastolic parameters were normal.  2. Right ventricular systolic function is normal. The right ventricular size is normal.  3. The mitral valve is normal in structure. Trivial mitral valve regurgitation.  4. The aortic valve is normal in structure. Aortic valve regurgitation is not visualized. FINDINGS  Left Ventricle: Left ventricular ejection fraction, by estimation, is 60 to 65%. The left ventricle has normal function. The left ventricle has no regional wall motion abnormalities. The left ventricular internal cavity size was normal in size. There is  no left ventricular hypertrophy. Left ventricular diastolic parameters were normal. Right Ventricle: The right ventricular size is normal. No increase  in right ventricular wall thickness. Right ventricular systolic function is normal. Left Atrium: Left atrial size  was normal in size. Right Atrium: Right atrial size was normal in size. Pericardium: There is no evidence of pericardial effusion. Mitral Valve: The mitral valve is normal in structure. Trivial mitral valve regurgitation. Tricuspid Valve: The tricuspid valve is normal in structure. Tricuspid valve regurgitation is trivial. Aortic Valve: The aortic valve is normal in structure. Aortic valve regurgitation is not visualized. Aortic valve mean gradient measures 2.0 mmHg. Aortic valve peak gradient measures 3.9 mmHg. Aortic valve area, by VTI measures 2.62 cm. Pulmonic Valve: The pulmonic valve was normal in structure. Pulmonic valve regurgitation is not visualized. Aorta: The aortic root and ascending aorta are structurally normal, with no evidence of dilitation. IAS/Shunts: No atrial level shunt detected by color flow Doppler.  LEFT VENTRICLE PLAX 2D LVIDd:         3.79 cm  Diastology LVIDs:         2.79 cm  LV e' medial:    7.62 cm/s LV PW:         0.93 cm  LV E/e' medial:  9.9 LV IVS:        0.88 cm  LV e' lateral:   4.57 cm/s LVOT diam:     2.10 cm  LV E/e' lateral: 16.6 LV SV:         48 LV SV Index:   29 LVOT Area:     3.46 cm  RIGHT VENTRICLE RV Basal diam:  3.04 cm LEFT ATRIUM             Index       RIGHT ATRIUM           Index LA diam:        3.00 cm 1.81 cm/m  RA Area:     18.60 cm LA Vol (A2C):   20.4 ml 12.30 ml/m RA Volume:   55.20 ml  33.29 ml/m LA Vol (A4C):   28.2 ml 17.01 ml/m LA Biplane Vol: 24.1 ml 14.53 ml/m  AORTIC VALVE                   PULMONIC VALVE AV Area (Vmax):    2.44 cm    PV Vmax:        0.54 m/s AV Area (Vmean):   2.59 cm    PV Peak grad:   1.1 mmHg AV Area (VTI):     2.62 cm    RVOT Peak grad: 1 mmHg AV Vmax:           99.05 cm/s AV Vmean:          68.850 cm/s AV VTI:            0.182 m AV Peak Grad:      3.9 mmHg AV Mean Grad:      2.0 mmHg LVOT Vmax:         69.80 cm/s LVOT Vmean:        51.400 cm/s LVOT VTI:          0.138 m LVOT/AV VTI ratio: 0.76  AORTA Ao Root diam:  3.10 cm MITRAL VALVE                TRICUSPID VALVE MV Area (PHT): 6.71 cm     TR Peak grad:   12.2 mmHg MV Decel Time: 113 msec     TR Vmax:        175.00 cm/s MV E  velocity: 75.80 cm/s MV A velocity: 107.00 cm/s  SHUNTS MV E/A ratio:  0.71         Systemic VTI:  0.14 m                             Systemic Diam: 2.10 cm Arnoldo Hooker MD Electronically signed by Arnoldo Hooker MD Signature Date/Time: 09/28/2020/6:04:52 PM    Final         Scheduled Meds:  acetaminophen  1,000 mg Oral BID   divalproex  250 mg Oral TID   donepezil  10 mg Oral QHS   enoxaparin (LOVENOX) injection  40 mg Subcutaneous Q24H   ferrous sulfate  325 mg Oral Q1200   gabapentin  100 mg Oral QHS   levothyroxine  137 mcg Oral QAC breakfast   melatonin  5 mg Oral QHS   pantoprazole  40 mg Oral Daily   sucralfate  1 g Oral BID   traZODone  100 mg Oral QHS   Continuous Infusions:     LOS: 1 day    Time spent: 35 minutes    Ramiro Harvest, MD [facility]   To contact the attending provider between 7A-7P or the covering provider during after hours 7P-7A, please log into the web site [email or web address] and access using universal [place] password for that web site. If you do not have the password, please call the hospital operator.  09/29/2020, 12:44 PM

## 2020-09-29 NOTE — TOC Progression Note (Signed)
Transition of Care [facility]) - Progression Note    Patient Details  Name: Kerry Cabrera MRN: [id number] Date of Birth: 30-May-1928  [facility] CM/SW Contact  Caryn Section, RN Phone Number: 09/29/2020, 9:18 AM  Clinical Narrative:   TOC spoke to Lauren from [facility], where patient lives. Lauren states based on PT/OT evals, patient is suitable to return to facility.  RNCM spoke with patient's daughter, Verlon Au who states that she is comfortable with [facility] returning to [facility], as she feels that is the best environment for her mom at this time.  Daughter states that family or [facility] can transport home upon discharge.  TOC contact information given to facility and daughter, TOC will follow to discharge.      Expected Discharge Plan and Services                                                 Social Determinants of Health (SDOH) Interventions    Readmission Risk Interventions Readmission Risk Prevention Plan 10/08/2019  Transportation Screening Complete  PCP or Specialist Appt within 3-5 Days Complete  HRI or Home Care Consult Complete  Social Work Consult for Recovery Care Planning/Counseling Complete  Palliative Care Screening Not Applicable  Medication Review Ocean[facility]) Referral to Pharmacy  Some recent data might be hidden

## 2020-09-29 NOTE — TOC Progression Note (Signed)
Transition of Care [facility]) - Progression Note    Patient Details  Name: ANSHIKA PETHTEL MRN: [id number] Date of Birth: [birth year]  Transition [facility] Care [facility]) CM/SW Contact  Caryn Section, RN Phone Number: 09/29/2020, 4:13 PM  Clinical Narrative:  Home health was recommended for patient.  Facility uses [facility] for [facility].  [place] at [facility] notified of need.           Expected Discharge Plan and Services                                                 Social Determinants of Health (SDOH) Interventions    Readmission Risk Interventions Readmission Risk Prevention Plan 10/08/2019  Transportation Screening Complete  PCP or Specialist Appt within 3-5 Days Complete  HRI or Home Care Consult Complete  Social Work Consult for Recovery Care Planning/Counseling Complete  Palliative Care Screening Not Applicable  Medication Review Oceanographer) Referral to Pharmacy  Some recent data might be hidden

## 2020-09-30 LAB — RESP PANEL BY RT-PCR (FLU A&B, COVID) ARPGX2
Influenza A by PCR: NEGATIVE
Influenza B by PCR: NEGATIVE
SARS Coronavirus 2 by RT PCR: NEGATIVE

## 2020-09-30 LAB — BASIC METABOLIC PANEL
Anion gap: 5 (ref 5–15)
BUN: 6 mg/dL — ABNORMAL LOW (ref 8–23)
CO2: 28 mmol/L (ref 22–32)
Calcium: 8.4 mg/dL — ABNORMAL LOW (ref 8.9–10.3)
Chloride: 102 mmol/L (ref 98–111)
Creatinine, Ser: 0.56 mg/dL (ref 0.44–1.00)
GFR, Estimated: >60 mL/min (ref >60)
Glucose, Bld: 87 mg/dL (ref 70–99)
Potassium: 3.2 mmol/L — ABNORMAL LOW (ref 3.5–5.1)
Sodium: 135 mmol/L (ref 135–145)

## 2020-09-30 MED ORDER — AMLODIPINE BESYLATE 5 MG PO TABS: 2.5000 mg | ORAL_TABLET | Freq: Every day | ORAL | 0 refills | Status: DC

## 2020-09-30 MED ORDER — POTASSIUM CHLORIDE 20 MEQ PO PACK
40.0000 meq | PACK | Freq: Once | ORAL | Status: AC
  Administered 2020-09-30: 40 meq via ORAL
  Filled 2020-09-30: qty 2

## 2020-09-30 MED ORDER — POTASSIUM CHLORIDE 20 MEQ PO PACK: 40.0000 meq | PACK | ORAL | Status: DC

## 2020-09-30 NOTE — Care Management Important Message (Signed)
Important Message  Patient Details  Name: Kerry Cabrera MRN: [id number] Date of Birth: July 11, 1928   Medicare Important Message Given:  N/A - LOS <3 / Initial given by admissions  Initial Medicare IM reviewed with Earley Favor, daughter by S. Conard Novak, Patient Access Associate on 09/29/2020 at 9:48am.   Johnell Comings 09/30/2020, 8:25 AM

## 2020-09-30 NOTE — TOC Progression Note (Signed)
Transition of Care [facility]) - Progression Note    Patient Details  Name: Kerry Cabrera MRN: [id number] Date of Birth: [birth year]  Transition of Care [facility]) CM/SW Contact  Caryn Section, RN Phone Number: 09/30/2020, 8:31 AM  Clinical Narrative:   Patient can return to [facility] today.  Lupita Leash, RN at [facility] that patient can start PT with their in house services, [facility] and then determine if she will need [facility] for more intensive therapy.  Transportation for [facility] to arrive at 1pm.  Awaiting negative COVID test.          Expected Discharge Plan and Services                                                 Social Determinants of Health (SDOH) Interventions    Readmission Risk Interventions Readmission Risk Prevention Plan 10/08/2019  Transportation Screening Complete  PCP or Specialist Appt within 3-5 Days Complete  HRI or Home Care Consult Complete  Social Work Consult for Recovery Care Planning/Counseling Complete  Palliative Care Screening Not Applicable  Medication Review Oceanographer) Referral to Pharmacy  Some recent data might be hidden

## 2020-09-30 NOTE — Progress Notes (Signed)
Spoke with Lauren/Donna at [facility] about patient status. Notified elena of needed documents that facility is requesting. Patient ready, items gathered, ready for DC. Spoke with daughter and updated her as well.

## 2020-09-30 NOTE — [place] FL2 (Signed)
[place] MEDICAID FL2 LEVEL OF CARE SCREENING TOOL     IDENTIFICATION  Patient Name: Kerry Cabrera Birthdate: [birth year] Sex: female Admission Date (Current Location): 09/27/2020  [place] and [place] Number:  Chiropodist and Address:  [facility], [address], [place], [place] [address]      Provider Number: [id number]  Attending Physician Name and Address:  No att. providers found  Relative Name and Phone Number:  Earley Favor (Daughter)   [phone] (Mobile)    Current Level of Care: Other (Comment) ([facility]) Recommended Level of Care: Memory Care Prior Approval Number:    Date Approved/Denied:   PASRR Number: [id number]  Discharge Plan: Other (Comment) ([facility] [place])    Current Diagnoses: Patient Active Problem List   Diagnosis Date Noted   Dehydration    Syncope    Elevated troponin    Hypotension 09/27/2020   Syncope, vasovagal 09/27/2020   Hypocalcemia 09/27/2020   Lactic acidosis 09/27/2020   Dementia without behavioral disturbance (HCC) 09/27/2020   Acute encephalopathy 10/07/2019   Closed left hip fracture (HCC) 06/21/2019   Acute respiratory failure with hypoxia (HCC) 06/21/2019   GERD (gastroesophageal reflux disease) 06/21/2019   Depression with anxiety 06/21/2019   Fall 06/21/2019   Symptomatic bradycardia 11/05/2017   Abnormal gait 12/17/2016   Avascular necrosis of femoral head (HCC) 12/17/2016   Bursitis of hip 12/17/2016   CAD (coronary artery disease) 12/17/2016   Closed fracture of intracapsular section of femur (HCC) 12/17/2016   Closed transcervical fracture of femur (HCC) 12/17/2016   Hip pain 12/17/2016   Inflammation of sacroiliac joint (HCC) 12/17/2016   Osteoarthritis of hip 12/17/2016   Osteoarthritis of knee 12/17/2016   Post-traumatic osteoarthritis 12/17/2016   Diarrhea 09/20/2016   Hiatal hernia 09/12/2016   Acquired hypothyroidism 07/04/2016   S/P total  knee arthroplasty 08/08/2015   Moderate mitral insufficiency 12/20/2014   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   Hyperlipidemia, mixed 07/09/2013    Orientation RESPIRATION BLADDER Height & Weight     Self  Normal Continent Weight: 58.4 kg Height:  5\' 6"  (167.6 cm)  BEHAVIORAL SYMPTOMS/MOOD NEUROLOGICAL BOWEL NUTRITION STATUS  Other (Comment) (Dementia without behavioral disturbance (HCC))   Continent Diet (Heart)  AMBULATORY STATUS COMMUNICATION OF NEEDS Skin   Limited Assist (Safety awareness impairment) Verbally Normal                       Personal Care Assistance Level of Assistance  Bathing, Feeding, Dressing Bathing Assistance: Limited assistance Feeding assistance: Limited assistance Dressing Assistance: Limited assistance     Functional Limitations Info  Sight, Hearing, Speech Sight Info: Adequate Hearing Info: Adequate Speech Info: Adequate    SPECIAL CARE FACTORS FREQUENCY  PT (By licensed PT)     PT Frequency: as per facility routine              Contractures Contractures Info: Not present    Additional Factors Info  Code Status, Allergies Code Status Info: Full Code Allergies Info: Ace Inhibitors, Codeine, Gabapentin,  Micardis Hct (telmisartan-hctz), Prednisone Sulfa Antibiotics,  Zithromax (azithromycin           Current Medications (09/30/2020):  This is the current hospital active medication list Current Facility-Administered Medications  Medication Dose Route Frequency Provider Last Rate Last Admin   acetaminophen (TYLENOL) tablet 1,000 mg  1,000 mg Oral BID Tu, Ching T, DO   1,000 mg at 09/30/20 0858   amLODipine (NORVASC) tablet 2.5 mg  2.5 mg Oral Daily Rodolph Bong, MD   2.5 mg at 09/30/20 0857   divalproex (DEPAKOTE) DR tablet 250 mg  250 mg Oral TID Tu, Ching T, DO   250 mg at 09/30/20 0858   donepezil (ARICEPT) tablet 10 mg  10 mg Oral QHS Tu, Ching T, DO   10 mg at 09/29/20 2224   enoxaparin (LOVENOX) injection 40 mg  40 mg  Subcutaneous Q24H Tu, Ching T, DO   40 mg at 09/29/20 2224   ferrous sulfate tablet 325 mg  325 mg Oral Q1200 Tu, Ching T, DO   325 mg at 09/29/20 1231   gabapentin (NEURONTIN) capsule 100 mg  100 mg Oral QHS Tu, Ching T, DO   100 mg at 09/29/20 2224   hydrOXYzine (ATARAX/VISTARIL) tablet 10 mg  10 mg Oral Q4H PRN Manuela Schwartz, NP       levothyroxine (SYNTHROID) tablet 137 mcg  137 mcg Oral QAC breakfast Tu, Ching T, DO   137 mcg at 09/30/20 6712   melatonin tablet 5 mg  5 mg Oral QHS Tu, Ching T, DO   5 mg at 09/29/20 2223   pantoprazole (PROTONIX) EC tablet 40 mg  40 mg Oral Daily Tu, Ching T, DO   40 mg at 09/30/20 0858   sucralfate (CARAFATE) tablet 1 g  1 g Oral BID Tu, Ching T, DO   1 g at 09/30/20 4580   traZODone (DESYREL) tablet 100 mg  100 mg Oral QHS Tu, Ching T, DO   100 mg at 09/29/20 2224   Current Outpatient Medications  Medication Sig Dispense Refill   acetaminophen (TYLENOL) 500 MG tablet Take 1,000 mg by mouth 2 (two) times daily.     Cobalamin Combinations (B12 FOLATE) 800-800 MCG CAPS Take 1 capsule by mouth daily at 12 noon.     divalproex (DEPAKOTE) 250 MG DR tablet Take 250 mg by mouth 3 (three) times daily.     donepezil (ARICEPT) 10 MG tablet Take 10 mg by mouth at bedtime.     ferrous sulfate 325 (65 FE) MG tablet Take 325 mg by mouth daily at 12 noon.     gabapentin (NEURONTIN) 100 MG capsule Take 100 mg by mouth at bedtime.     levothyroxine (SYNTHROID) 137 MCG tablet Take 137 mcg by mouth daily before breakfast.     melatonin 3 MG TABS tablet Take 6 mg by mouth at bedtime.     omeprazole (PRILOSEC) 40 MG capsule Take 40 mg by mouth 2 (two) times daily.     sucralfate (CARAFATE) 1 g tablet Take 1 g by mouth 2 (two) times daily.     traZODone (DESYREL) 100 MG tablet Take 100 mg by mouth at bedtime.     [START ON 10/01/2020] amLODipine (NORVASC) 5 MG tablet Take 0.5 tablets (2.5 mg total) by mouth daily. 30 tablet 0     Discharge Medications: Please see  discharge summary for a list of discharge medications.  Relevant Imaging Results:  Relevant Lab Results:   Additional Information SS [id number]  Caryn Section, RN

## 2020-09-30 NOTE — Discharge Summary (Signed)
Physician Discharge Summary  Kerry Cabrera ATF:[id number] DOB: 07-Jan-1929 DOA: 09/27/2020  PCP: [facility]  Admit date: 09/27/2020 Discharge date: 09/30/2020  Time spent: 55 minutes  Recommendations for Outpatient Follow-up:  Follow-up with [facility] in 1 to 2 weeks.  On follow-up patient will need a basic metabolic profile done to follow-up on electrolytes and renal function.  Patient will need a CBC done to follow-up on H&H.  Orthostatics will need to be rechecked on follow-up.  Patient blood pressure also need to be reassessed as patient's HCTZ was discontinued and patient started on Norvasc 2.5 mg daily   Discharge Diagnoses:  Principal Problem:   Hypotension Active Problems:   Acquired hypothyroidism   Syncope, vasovagal   Hypocalcemia   Lactic acidosis   Dementia without behavioral disturbance (HCC)   Dehydration   Syncope   Elevated troponin   Discharge Condition: Stable and improved  Diet recommendation: Regular  Filed Weights   09/27/20 1300 09/28/20 0145  Weight: 60 kg 58.4 kg    History of present illness:  HPI per Dr. Doran Heater is a 87 y.o. female with medical history significant for dementia, CAD, TIA, hypothyroidism, history of avascular necrosis of the hip, and depression who presented from her skilled nursing facility with concerns of syncope.  Patient only alert and oriented to herself and unable provide any history.  History obtained per ED documentation.  Reportedly patient had a syncopal episode and staff found her on the floor.  EMS reports systolic blood pressure of 70s upon arrival to facility.  Her blood pressure in the ER was 90/60 with improvement up to 116/60 1 liter of normal saline bolus.  She was also tachycardic up to 120 with EKG showing first-degree AV block. CBC showed no leukocytosis, mild anemia with hemoglobin of 11.8.  Sodium of 131.  BG of 108.  Corrected calcium of 8.5.  Troponin initially of 12  and increased to 37.  Lactate of 2.4.  Negative UA.  Negative CT head and cervical spine.  Hospital Course:  1 syncope secondary to hypotension -Patient with history of underlying dementia no other history obtained from nursing facility noted to have been found down unclear what is oral intake versus GI losses. -Noted per admitting H&P that EMS noted systolic blood pressures in the 70s on presentation. -CTA head and C-spine negative for any acute abnormalities per -No arrhythmias noted on telemetry. -Blood pressure improved with hydration. -2D echo with a EF of 60 to 65%, normal aortic valvular structure, NWMA. -Patient's antihypertensives were held initially on presentation and resumed once patient noted to be euvolemic.   -Patient hydrated with IV fluids and improved clinically.   -Patient be discharged in stable and improved condition with outpatient follow-up with PCP.   2.  Elevated troponin -Felt secondary to demand ischemia from hypotension. -Patient asymptomatic. -EKG with no ischemic changes noted. -Repeat 2D echo with a EF of 60 to 65%, NWMA, normal aortic valve structure.   -No further work-up needed at this time.    3.  Lactic acidosis -Secondary to dehydration. -Resolved with hydration.   4.  Hypothyroidism -Synthroid.  5.  Dementia -Patient alert and oriented to self.   -Patient maintained on home regimen Depakote, Aricept.   -Outpatient follow-up with PCP.  6.  hiatal hernia -Patient maintained on PPI and Carafate.   -Outpatient follow-up.   7.  Hypokalemia -Repleted.  8.  Hypertension -HCTZ discontinued secondary to problem #1.   -Patient started on  Norvasc 2.5 mg daily.   -HCTZ discontinued on discharge.   -Outpatient follow-up.       Procedures: CT head 09/27/2020 CT C-spine 09/27/2020 Chest x-ray 09/27/2020 2D echo 09/28/2020  Consultations: None  Discharge Exam: Vitals:   09/30/20 0753 09/30/20 1205  BP: (!) 150/81 (!) 150/91  Pulse: 72 86   Resp: 16 16  Temp: 98.5 F (36.9 C) 98.4 F (36.9 C)  SpO2: 96% (!) 86%    General: NAD Cardiovascular: RRR no murmurs rubs or gallops.  No JVD.  No lower extremity edema Respiratory: CTA B  Discharge Instructions   Discharge Instructions     Diet general   Complete by: As directed    Increase activity slowly   Complete by: As directed       Allergies as of 09/30/2020       Reactions   Ace Inhibitors Cough   Codeine Nausea And Vomiting, Nausea Only   Gabapentin Other (See Comments)   unsure   Micardis Hct [telmisartan-hctz]    unsure   Prednisone Other (See Comments)   Pt has received cortisone injection for knee with out problems. Pt states she will not take pills for extended time due to feeling lethargic and weight gain. Weight gain   Sulfa Antibiotics Other (See Comments), Rash   Fever 106 degrees,  sent to ER High fever   Zithromax [azithromycin] Other (See Comments), Rash   unsure        Medication List     STOP taking these medications    hydrochlorothiazide 12.5 MG capsule Commonly known as: MICROZIDE       TAKE these medications    acetaminophen 500 MG tablet Commonly known as: TYLENOL Take 1,000 mg by mouth 2 (two) times daily.   amLODipine 5 MG tablet Commonly known as: NORVASC Take 0.5 tablets (2.5 mg total) by mouth daily. Start taking on: October 01, 2020   B12 Folate 800-800 MCG Caps Take 1 capsule by mouth daily at 12 noon.   divalproex 250 MG DR tablet Commonly known as: DEPAKOTE Take 250 mg by mouth 3 (three) times daily.   donepezil 10 MG tablet Commonly known as: ARICEPT Take 10 mg by mouth at bedtime.   ferrous sulfate 325 (65 FE) MG tablet Take 325 mg by mouth daily at 12 noon.   gabapentin 100 MG capsule Commonly known as: NEURONTIN Take 100 mg by mouth at bedtime.   levothyroxine 137 MCG tablet Commonly known as: SYNTHROID Take 137 mcg by mouth daily before breakfast.   melatonin 3 MG Tabs tablet Take 6 mg  by mouth at bedtime.   omeprazole 40 MG capsule Commonly known as: PRILOSEC Take 40 mg by mouth 2 (two) times daily.   sucralfate 1 g tablet Commonly known as: CARAFATE Take 1 g by mouth 2 (two) times daily.   traZODone 100 MG tablet Commonly known as: DESYREL Take 100 mg by mouth at bedtime.       Allergies  Allergen Reactions   Ace Inhibitors Cough   Codeine Nausea And Vomiting and Nausea Only   Gabapentin Other (See Comments)    unsure   Micardis Hct [Telmisartan-Hctz]     unsure   Prednisone Other (See Comments)    Pt has received cortisone injection for knee with out problems. Pt states she will not take pills for extended time due to feeling lethargic and weight gain. Weight gain   Sulfa Antibiotics Other (See Comments) and Rash    Fever 106 degrees,  sent to ER High fever   Zithromax [Azithromycin] Other (See Comments) and Rash    unsure    Follow-up Information     [facility] Schedule an appointment as soon as possible for a visit in 2 week(s).   Specialty: Geriatric Medicine Why: Follow-up in 1 to 2 weeks. Contact information: [address] [place] [address] [phone]                  The results of significant diagnostics from this hospitalization (including imaging, microbiology, ancillary and laboratory) are listed below for reference.    Significant Diagnostic Studies: CT Head Wo Contrast  Result Date: 09/27/2020 CLINICAL DATA:  Syncopal episode.  Fell. EXAM: CT HEAD WITHOUT CONTRAST CT CERVICAL SPINE WITHOUT CONTRAST TECHNIQUE: Multidetector CT imaging of the head and cervical spine was performed following the standard protocol without intravenous contrast. Multiplanar CT image reconstructions of the cervical spine were also generated. COMPARISON:  Head CT 10/07/2019 FINDINGS: CT HEAD FINDINGS Brain: Stable age related cerebral atrophy, ventriculomegaly and periventricular white matter disease. No  extra-axial fluid collections are identified. No CT findings for acute hemispheric infarction or intracranial hemorrhage. No mass lesions. The brainstem and cerebellum are normal. Vascular: Stable vascular calcifications. No aneurysm or hyperdense vessels. Skull: No skull fracture or bone lesions. Sinuses/Orbits: The paranasal sinuses and mastoid air cells are clear. The globes are intact. Other: No scalp lesions or scalp hematoma. CT CERVICAL SPINE FINDINGS Alignment: Normal Skull base and vertebrae: No acute fracture. No primary bone lesion or focal pathologic process. Soft tissues and spinal canal: No prevertebral fluid or swelling. No visible canal hematoma. Disc levels: The spinal canal is fairly generous. No large disc protrusions, spinal or foraminal stenosis. Upper chest: The visualized lung apices are grossly clear. Calcified granuloma noted at the left lung apex. Other: Bilateral carotid artery calcifications. IMPRESSION: 1. Stable age related cerebral atrophy, ventriculomegaly and periventricular white matter disease. 2. No acute intracranial findings or skull fracture. 3. Normal alignment of the cervical vertebral bodies and no acute cervical spine fracture. Electronically Signed   By: Rudie Meyer M.D.   On: 09/27/2020 14:08   CT Cervical Spine Wo Contrast  Result Date: 09/27/2020 CLINICAL DATA:  Syncopal episode.  Fell. EXAM: CT HEAD WITHOUT CONTRAST CT CERVICAL SPINE WITHOUT CONTRAST TECHNIQUE: Multidetector CT imaging of the head and cervical spine was performed following the standard protocol without intravenous contrast. Multiplanar CT image reconstructions of the cervical spine were also generated. COMPARISON:  Head CT 10/07/2019 FINDINGS: CT HEAD FINDINGS Brain: Stable age related cerebral atrophy, ventriculomegaly and periventricular white matter disease. No extra-axial fluid collections are identified. No CT findings for acute hemispheric infarction or intracranial hemorrhage. No mass  lesions. The brainstem and cerebellum are normal. Vascular: Stable vascular calcifications. No aneurysm or hyperdense vessels. Skull: No skull fracture or bone lesions. Sinuses/Orbits: The paranasal sinuses and mastoid air cells are clear. The globes are intact. Other: No scalp lesions or scalp hematoma. CT CERVICAL SPINE FINDINGS Alignment: Normal Skull base and vertebrae: No acute fracture. No primary bone lesion or focal pathologic process. Soft tissues and spinal canal: No prevertebral fluid or swelling. No visible canal hematoma. Disc levels: The spinal canal is fairly generous. No large disc protrusions, spinal or foraminal stenosis. Upper chest: The visualized lung apices are grossly clear. Calcified granuloma noted at the left lung apex. Other: Bilateral carotid artery calcifications. IMPRESSION: 1. Stable age related cerebral atrophy, ventriculomegaly and periventricular white matter disease. 2. No  acute intracranial findings or skull fracture. 3. Normal alignment of the cervical vertebral bodies and no acute cervical spine fracture. Electronically Signed   By: Rudie Meyer M.D.   On: 09/27/2020 14:08   DG Chest Portable 1 View  Result Date: 09/27/2020 CLINICAL DATA:  Syncope EXAM: PORTABLE CHEST 1 VIEW COMPARISON:  10/07/2019 FINDINGS: Mild cardiomegaly. Large hiatal hernia. Both lungs are clear. The visualized skeletal structures are unremarkable. IMPRESSION: 1. Mild cardiomegaly without acute abnormality of the lungs in AP portable projection. 2. Large hiatal hernia. Electronically Signed   By: Lauralyn Primes M.D.   On: 09/27/2020 14:54   ECHOCARDIOGRAM COMPLETE  Result Date: 09/28/2020    ECHOCARDIOGRAM REPORT   Patient Name:   Kerry Cabrera Date of Exam: 09/28/2020 Medical Rec #:  [id number]        Height:       66.0 in Accession #:    [id number]       Weight:       128.7 lb Date of Birth:  1929-03-28         BSA:          1.658 m Patient Age:    87 years         BP:           156/79 mmHg  Patient Gender: F                HR:           65 bpm. Exam Location:  [facility] Procedure: 2D Echo, Cardiac Doppler and Color Doppler Indications:     Syncope R55  History:         Patient has prior history of Echocardiogram examinations, most                  recent 11/05/2017. CAD; Risk Factors:Hypertension and                  Dyslipidemia.  Sonographer:     Cristela Blue RDCS (AE) Referring Phys:  [id number] Sunny Schlein [facility] Diagnosing Phys: Arnoldo Hooker MD  Sonographer Comments: Suboptimal apical window and no subcostal window. IMPRESSIONS  1. Left ventricular ejection fraction, by estimation, is 60 to 65%. The left ventricle has normal function. The left ventricle has no regional wall motion abnormalities. Left ventricular diastolic parameters were normal.  2. Right ventricular systolic function is normal. The right ventricular size is normal.  3. The mitral valve is normal in structure. Trivial mitral valve regurgitation.  4. The aortic valve is normal in structure. Aortic valve regurgitation is not visualized. FINDINGS  Left Ventricle: Left ventricular ejection fraction, by estimation, is 60 to 65%. The left ventricle has normal function. The left ventricle has no regional wall motion abnormalities. The left ventricular internal cavity size was normal in size. There is  no left ventricular hypertrophy. Left ventricular diastolic parameters were normal. Right Ventricle: The right ventricular size is normal. No increase in right ventricular wall thickness. Right ventricular systolic function is normal. Left Atrium: Left atrial size was normal in size. Right Atrium: Right atrial size was normal in size. Pericardium: There is no evidence of pericardial effusion. Mitral Valve: The mitral valve is normal in structure. Trivial mitral valve regurgitation. Tricuspid Valve: The tricuspid valve is normal in structure. Tricuspid valve regurgitation is trivial. Aortic Valve: The aortic valve is normal in structure. Aortic valve  regurgitation is not visualized. Aortic valve mean gradient measures 2.0 mmHg. Aortic valve peak gradient measures 3.9 mmHg.  Aortic valve area, by VTI measures 2.62 cm. Pulmonic Valve: The pulmonic valve was normal in structure. Pulmonic valve regurgitation is not visualized. Aorta: The aortic root and ascending aorta are structurally normal, with no evidence of dilitation. IAS/Shunts: No atrial level shunt detected by color flow Doppler.  LEFT VENTRICLE PLAX 2D LVIDd:         3.79 cm  Diastology LVIDs:         2.79 cm  LV e' medial:    7.62 cm/s LV PW:         0.93 cm  LV E/e' medial:  9.9 LV IVS:        0.88 cm  LV e' lateral:   4.57 cm/s LVOT diam:     2.10 cm  LV E/e' lateral: 16.6 LV SV:         48 LV SV Index:   29 LVOT Area:     3.46 cm  RIGHT VENTRICLE RV Basal diam:  3.04 cm LEFT ATRIUM             Index       RIGHT ATRIUM           Index LA diam:        3.00 cm 1.81 cm/m  RA Area:     18.60 cm LA Vol (A2C):   20.4 ml 12.30 ml/m RA Volume:   55.20 ml  33.29 ml/m LA Vol (A4C):   28.2 ml 17.01 ml/m LA Biplane Vol: 24.1 ml 14.53 ml/m  AORTIC VALVE                   PULMONIC VALVE AV Area (Vmax):    2.44 cm    PV Vmax:        0.54 m/s AV Area (Vmean):   2.59 cm    PV Peak grad:   1.1 mmHg AV Area (VTI):     2.62 cm    RVOT Peak grad: 1 mmHg AV Vmax:           99.05 cm/s AV Vmean:          68.850 cm/s AV VTI:            0.182 m AV Peak Grad:      3.9 mmHg AV Mean Grad:      2.0 mmHg LVOT Vmax:         69.80 cm/s LVOT Vmean:        51.400 cm/s LVOT VTI:          0.138 m LVOT/AV VTI ratio: 0.76  AORTA Ao Root diam: 3.10 cm MITRAL VALVE                TRICUSPID VALVE MV Area (PHT): 6.71 cm     TR Peak grad:   12.2 mmHg MV Decel Time: 113 msec     TR Vmax:        175.00 cm/s MV E velocity: 75.80 cm/s MV A velocity: 107.00 cm/s  SHUNTS MV E/A ratio:  0.71         Systemic VTI:  0.14 m                             Systemic Diam: 2.10 cm Arnoldo HookerBruce Kowalski MD Electronically signed by Arnoldo HookerBruce Kowalski MD Signature  Date/Time: 09/28/2020/6:04:52 PM    Final     Microbiology: Recent Results (from the past 240 hour(s))  SARS CORONAVIRUS 2 (TAT  6-24 HRS) Nasopharyngeal Nasopharyngeal Swab     Status: None   Collection Time: 09/27/20  7:37 PM   Specimen: Nasopharyngeal Swab  Result Value Ref Range Status   SARS Coronavirus 2 NEGATIVE NEGATIVE Final    Comment: (NOTE) SARS-CoV-2 target nucleic acids are NOT DETECTED.  The SARS-CoV-2 RNA is generally detectable in upper and lower respiratory specimens during the acute phase of infection. Negative results do not preclude SARS-CoV-2 infection, do not rule out co-infections with other pathogens, and should not be used as the sole basis for treatment or other patient management decisions. Negative results must be combined with clinical observations, patient history, and epidemiological information. The expected result is Negative.  Fact Sheet for Patients: HairSlick.no  Fact Sheet for Healthcare Providers: quierodirigir.com  This test is not yet approved or cleared by the [place] FDA and  has been authorized for detection and/or diagnosis of SARS-CoV-2 by FDA under an Emergency Use Authorization (EUA). This EUA will remain  in effect (meaning this test can be used) for the duration of the COVID-19 declaration under Se ction 564(b)(1) of the Act, 21 U.S.C. section 360bbb-3(b)(1), unless the authorization is terminated or revoked sooner.  Performed at [facility], [address] [place], [place] [address]   MRSA PCR Screening     Status: None   Collection Time: 09/28/20  3:27 PM  Result Value Ref Range Status   MRSA by PCR NEGATIVE NEGATIVE Final    Comment:        The GeneXpert MRSA Assay (FDA approved for NASAL specimens only), is one component of a comprehensive MRSA colonization surveillance program. It is not intended to diagnose MRSA infection nor to guide or monitor  treatment for MRSA infections. Performed at [facility], [address] [place], [place] [address]   Resp Panel by RT-PCR (Flu A&B, Covid) Nasopharyngeal Swab     Status: None   Collection Time: 09/30/20  9:05 AM   Specimen: Nasopharyngeal Swab; Nasopharyngeal(NP) swabs in vial transport medium  Result Value Ref Range Status   SARS Coronavirus 2 by RT PCR NEGATIVE NEGATIVE Final    Comment: (NOTE) SARS-CoV-2 target nucleic acids are NOT DETECTED.  The SARS-CoV-2 RNA is generally detectable in upper respiratory specimens during the acute phase of infection. The lowest concentration of SARS-CoV-2 viral copies this assay can detect is 138 copies/mL. A negative result does not preclude SARS-Cov-2 infection and should not be used as the sole basis for treatment or other patient management decisions. A negative result may occur with  improper specimen collection/handling, submission of specimen other than nasopharyngeal swab, presence of viral mutation(s) within the areas targeted by this assay, and inadequate number of viral copies(<138 copies/mL). A negative result must be combined with clinical observations, patient history, and epidemiological information. The expected result is Negative.  Fact Sheet for Patients:  BloggerCourse.com  Fact Sheet for Healthcare Providers:  SeriousBroker.it  This test is no t yet approved or cleared by the [place] FDA and  has been authorized for detection and/or diagnosis of SARS-CoV-2 by FDA under an Emergency Use Authorization (EUA). This EUA will remain  in effect (meaning this test can be used) for the duration of the COVID-19 declaration under Section 564(b)(1) of the Act, 21 U.S.C.section 360bbb-3(b)(1), unless the authorization is terminated  or revoked sooner.       Influenza A by PCR NEGATIVE NEGATIVE Final   Influenza B by PCR NEGATIVE NEGATIVE Final    Comment:  (NOTE)  The Xpert Xpress SARS-CoV-2/FLU/RSV plus assay is intended as an aid in the diagnosis of influenza from Nasopharyngeal swab specimens and should not be used as a sole basis for treatment. Nasal washings and aspirates are unacceptable for Xpert Xpress SARS-CoV-2/FLU/RSV testing.  Fact Sheet for Patients: BloggerCourse.com  Fact Sheet for Healthcare Providers: SeriousBroker.it  This test is not yet approved or cleared by the [place] FDA and has been authorized for detection and/or diagnosis of SARS-CoV-2 by FDA under an Emergency Use Authorization (EUA). This EUA will remain in effect (meaning this test can be used) for the duration of the COVID-19 declaration under Section 564(b)(1) of the Act, 21 U.S.C. section 360bbb-3(b)(1), unless the authorization is terminated or revoked.  Performed at [facility], [address] [place], [place] [address]      Labs: Basic Metabolic Panel: Recent Labs  Lab 09/27/20 1322 09/27/20 1537 09/28/20 0445 09/29/20 0543 09/30/20 0527  NA 131*  --  135 138 135  K 3.9  --  3.1* 3.7 3.2*  CL 97*  --  100 102 102  CO2 23  --  30 29 28   GLUCOSE 108*  --  87 90 87  BUN 9  --  9 6* 6*  CREATININE 0.64  --  0.56 0.57 0.56  CALCIUM 7.6*  --  8.3* 8.6* 8.4*  MG  --  1.9  --  2.2  --    Liver Function Tests: Recent Labs  Lab 09/27/20 1322  AST 30  ALT <5  ALKPHOS 42  BILITOT 1.1  PROT 4.5*  ALBUMIN 2.4*   No results for input(s): LIPASE, AMYLASE in the last 168 hours. No results for input(s): AMMONIA in the last 168 hours. CBC: Recent Labs  Lab 09/27/20 1322 09/29/20 0543  WBC 5.1 4.6  NEUTROABS  --  2.5  HGB 11.8* 13.0  HCT 35.2* 38.6  MCV 91.0 91.0  PLT 218 221   Cardiac Enzymes: Recent Labs  Lab 09/27/20 1537  CKTOTAL 47   BNP: BNP (last 3 results) No results for input(s): BNP in the last 8760 hours.  ProBNP (last 3 results) No results for  input(s): PROBNP in the last 8760 hours.  CBG: No results for input(s): GLUCAP in the last 168 hours.     Signed:  09/29/20 MD.  [facility] 09/30/2020, 12:16 PM

## 2020-11-17 ENCOUNTER — Encounter: Payer: Self-pay | Admitting: Emergency Medicine

## 2020-11-17 ENCOUNTER — Emergency Department: Payer: Medicare Other

## 2020-11-17 ENCOUNTER — Other Ambulatory Visit: Payer: Self-pay

## 2020-11-17 ENCOUNTER — Emergency Department
Admission: EM | Admit: 2020-11-17 | Discharge: 2020-11-18 | Disposition: A | Discharge status: Home / Self Care | Payer: Medicare Other | Attending: Emergency Medicine | Admitting: Emergency Medicine

## 2020-11-17 DIAGNOSIS — Z87891 Personal history of nicotine dependence: Secondary | ICD-10-CM | POA: Insufficient documentation

## 2020-11-17 DIAGNOSIS — F0390 Unspecified dementia without behavioral disturbance: Secondary | ICD-10-CM | POA: Diagnosis not present

## 2020-11-17 DIAGNOSIS — E039 Hypothyroidism, unspecified: Secondary | ICD-10-CM | POA: Diagnosis not present

## 2020-11-17 DIAGNOSIS — X58XXXA Exposure to other specified factors, initial encounter: Secondary | ICD-10-CM | POA: Insufficient documentation

## 2020-11-17 DIAGNOSIS — S00502A Unspecified superficial injury of oral cavity, initial encounter: Secondary | ICD-10-CM | POA: Insufficient documentation

## 2020-11-17 DIAGNOSIS — I10 Essential (primary) hypertension: Secondary | ICD-10-CM | POA: Insufficient documentation

## 2020-11-17 DIAGNOSIS — I2510 Atherosclerotic heart disease of native coronary artery without angina pectoris: Secondary | ICD-10-CM | POA: Insufficient documentation

## 2020-11-17 DIAGNOSIS — Z79899 Other long term (current) drug therapy: Secondary | ICD-10-CM | POA: Diagnosis not present

## 2020-11-17 DIAGNOSIS — R400 Somnolence: Secondary | ICD-10-CM | POA: Diagnosis not present

## 2020-11-17 DIAGNOSIS — S0993XA Unspecified injury of face, initial encounter: Secondary | ICD-10-CM

## 2020-11-17 DIAGNOSIS — Z96641 Presence of right artificial hip joint: Secondary | ICD-10-CM | POA: Diagnosis not present

## 2020-11-17 LAB — CBC WITH DIFFERENTIAL/PLATELET
Abs Immature Granulocytes: 0.03 10*3/uL (ref 0.00–0.07)
Basophils Absolute: 0.1 10*3/uL (ref 0.0–0.1)
Basophils Relative: 1 %
Eosinophils Absolute: 0 10*3/uL (ref 0.0–0.5)
Eosinophils Relative: 0 %
HCT: 38.4 % (ref 36.0–46.0)
Hemoglobin: 13 g/dL (ref 12.0–15.0)
Immature Granulocytes: 1 %
Lymphocytes Relative: 19 %
Lymphs Abs: 1.2 10*3/uL (ref 0.7–4.0)
MCH: 31.3 pg (ref 26.0–34.0)
MCHC: 33.9 g/dL (ref 30.0–36.0)
MCV: 92.5 fL (ref 80.0–100.0)
Monocytes Absolute: 0.5 10*3/uL (ref 0.1–1.0)
Monocytes Relative: 7 %
Neutro Abs: 4.6 10*3/uL (ref 1.7–7.7)
Neutrophils Relative %: 72 %
Platelets: 196 10*3/uL (ref 150–400)
RBC: 4.15 MIL/uL (ref 3.87–5.11)
RDW: 13.5 % (ref 11.5–15.5)
WBC: 6.4 10*3/uL (ref 4.0–10.5)
nRBC: 0 % (ref 0.0–0.2)

## 2020-11-17 LAB — BRAIN NATRIURETIC PEPTIDE: B Natriuretic Peptide: 179.2 pg/mL — ABNORMAL HIGH (ref 0.0–100.0)

## 2020-11-17 LAB — COMPREHENSIVE METABOLIC PANEL
ALT: 13 U/L (ref 0–44)
AST: 21 U/L (ref 15–41)
Albumin: 3.4 g/dL — ABNORMAL LOW (ref 3.5–5.0)
Alkaline Phosphatase: 44 U/L (ref 38–126)
Anion gap: 10 (ref 5–15)
BUN: 10 mg/dL (ref 8–23)
CO2: 25 mmol/L (ref 22–32)
Calcium: 8.9 mg/dL (ref 8.9–10.3)
Chloride: 99 mmol/L (ref 98–111)
Creatinine, Ser: 0.91 mg/dL (ref 0.44–1.00)
GFR, Estimated: 60 mL/min — ABNORMAL LOW (ref >60)
Glucose, Bld: 169 mg/dL — ABNORMAL HIGH (ref 70–99)
Potassium: 3.1 mmol/L — ABNORMAL LOW (ref 3.5–5.1)
Sodium: 134 mmol/L — ABNORMAL LOW (ref 135–145)
Total Bilirubin: 0.8 mg/dL (ref 0.3–1.2)
Total Protein: 5.9 g/dL — ABNORMAL LOW (ref 6.5–8.1)

## 2020-11-17 LAB — LACTIC ACID, PLASMA
Lactic Acid, Venous: 3.4 mmol/L (ref 0.5–1.9)
Lactic Acid, Venous: 1.5 mmol/L (ref 0.5–1.9)

## 2020-11-17 LAB — TROPONIN I (HIGH SENSITIVITY)
Troponin I (High Sensitivity): 12 ng/L (ref <18)
Troponin I (High Sensitivity): 10 ng/L (ref <18)

## 2020-11-17 MED ORDER — SODIUM CHLORIDE 0.9 % IV BOLUS
1000.0000 mL | Freq: Once | INTRAVENOUS | Status: AC
  Administered 2020-11-17: 1000 mL via INTRAVENOUS

## 2020-11-17 NOTE — ED Notes (Signed)
Pt to ED via EMS from [facility] c/o lethargic and unresponsive today.  EMS states patient was talking to staff today; while daughter was visiting pt went to bathroom and became unresponsive on toilet.  Pt had been given 100mg  trazadone and is also on gabapentin per EMS.  Found to be 65% RA and placed on 4L [place] up to 100%.  Pt alert upon arrival, placed on 2L [place], EDP at bedside.

## 2020-11-17 NOTE — ED Notes (Signed)
Pt st she is unable to preform a urine collection at this time

## 2020-11-17 NOTE — ED Notes (Signed)
Pt taken to CT at this time.

## 2020-11-17 NOTE — ED Notes (Signed)
MD notified of 3.4 Lactic acid result per lab

## 2020-11-17 NOTE — ED Provider Notes (Signed)
[facility] Emergency Department Provider Note   ____________________________________________   Event Date/Time   First MD Initiated Contact with Patient 11/17/20 2013     (approximate)  I have reviewed the triage vital signs and the nursing notes.   HISTORY  Chief Complaint Medication Problem    HPI Kerry Cabrera is a 87 y.o. female who presents via EMS from her long-term care facility for altered mental status.  Patient is unable to participate in history or review of systems given mental status.  EMS state that patient got her nighttime medications of gabapentin and trazodone prior to this decrease in alertness.  EMS state that she was conscious and breathing however did not respond to any painful stimuli or attempts at waking.  However, they do note that patient was increasingly arousable throughout transport and woke up fully upon their arrival to our emergency department.  Patient is currently denying any complaints and asking for water.          Past Medical History:  Diagnosis Date   Arthritis    Carotid artery occlusion    Complication of anesthesia    very confused for days   Coronary artery disease    Depression    Glaucoma    Hyperlipemia    Hypertension    h/o not on medication   Hypothyroidism    Insomnia    PUD (peptic ulcer disease)    in distant past   TIA (transient ischemic attack)     Patient Active Problem List   Diagnosis Date Noted   Dehydration    Syncope    Elevated troponin    Hypotension 09/27/2020   Syncope, vasovagal 09/27/2020   Hypocalcemia 09/27/2020   Lactic acidosis 09/27/2020   Dementia without behavioral disturbance (HCC) 09/27/2020   Acute encephalopathy 10/07/2019   Closed left hip fracture (HCC) 06/21/2019   Acute respiratory failure with hypoxia (HCC) 06/21/2019   GERD (gastroesophageal reflux disease) 06/21/2019   Depression with anxiety 06/21/2019   Fall 06/21/2019   Symptomatic  bradycardia 11/05/2017   Abnormal gait 12/17/2016   Avascular necrosis of femoral head (HCC) 12/17/2016   Bursitis of hip 12/17/2016   CAD (coronary artery disease) 12/17/2016   Closed fracture of intracapsular section of femur (HCC) 12/17/2016   Closed transcervical fracture of femur (HCC) 12/17/2016   Hip pain 12/17/2016   Inflammation of sacroiliac joint (HCC) 12/17/2016   Osteoarthritis of hip 12/17/2016   Osteoarthritis of knee 12/17/2016   Post-traumatic osteoarthritis 12/17/2016   Diarrhea 09/20/2016   Hiatal hernia 09/12/2016   Acquired hypothyroidism 07/04/2016   S/P total knee arthroplasty 08/08/2015   Moderate mitral insufficiency 12/20/2014   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   Hyperlipidemia, mixed 07/09/2013    Past Surgical History:  Procedure Laterality Date   CARDIAC CATHETERIZATION     HIP PINNING,CANNULATED Left 06/21/2019   Procedure: CANNULATED HIP PINNING;  Surgeon: Christena Flake, MD;  Location: [facility] ORS;  Service: Orthopedics;  Laterality: Left;   KNEE ARTHROPLASTY Left 08/08/2015   Procedure: COMPUTER ASSISTED TOTAL KNEE ARTHROPLASTY;  Surgeon: Donato Heinz, MD;  Location: [facility] ORS;  Service: Orthopedics;  Laterality: Left;   KNEE ARTHROSCOPY     Percutaneous pinning of right femoral neck fracture     TONSILLECTOMY     TOTAL HIP ARTHROPLASTY Right 02/06/2014   Dr. Deeann Saint    Prior to Admission medications   Medication Sig Start Date End Date Taking? Authorizing Provider  acetaminophen (TYLENOL) 500 MG tablet Take  1,000 mg by mouth 2 (two) times daily.   Yes [provider]  amLODipine (NORVASC) 5 MG tablet Take 0.5 tablets (2.5 mg total) by mouth daily. 10/01/20  Yes Rodolph Bonghompson, Daniel V, MD  Cobalamin Combinations (B12 FOLATE) 800-800 MCG CAPS Take 1 capsule by mouth daily at 12 noon.   Yes [provider]  divalproex (DEPAKOTE) 250 MG DR tablet Take 250 mg by mouth 3 (three) times daily.   Yes [provider]  donepezil (ARICEPT)  10 MG tablet Take 10 mg by mouth at bedtime. 03/31/19  Yes [provider]  ferrous sulfate 325 (65 FE) MG tablet Take 325 mg by mouth daily at 12 noon.   Yes [provider]  gabapentin (NEURONTIN) 100 MG capsule Take 100 mg by mouth at bedtime.   Yes [provider]  levothyroxine (SYNTHROID) 137 MCG tablet Take 137 mcg by mouth daily before breakfast.   Yes [provider]  melatonin 3 MG TABS tablet Take 6 mg by mouth at bedtime.   Yes [provider]  omeprazole (PRILOSEC) 40 MG capsule Take 40 mg by mouth 2 (two) times daily. 03/31/19  Yes [provider]  sucralfate (CARAFATE) 1 g tablet Take 1 g by mouth 2 (two) times daily.   Yes [provider]  traZODone (DESYREL) 100 MG tablet Take 100 mg by mouth at bedtime. 06/08/19  Yes [provider]    Allergies Ace inhibitors, Codeine, Gabapentin, Micardis hct [telmisartan-hctz], Prednisone, Sulfa antibiotics, and Zithromax [azithromycin]  Family History  Problem Relation Age of Onset   Prostate cancer Son    Kidney disease Neg Hx    Kidney cancer Neg Hx     Social History Social History   Tobacco Use   Smoking status: Former    Years: 12.00    Types: Cigarettes   Smokeless tobacco: Never  Substance Use Topics   Alcohol use: Yes    Comment: osccasional   Drug use: No    Review of Systems Unable to assess secondary to mental status ____________________________________________   PHYSICAL EXAM:  VITAL SIGNS: ED Triage Vitals  Enc Vitals Group     BP --      Pulse --      Resp --      Temp --      Temp src --      SpO2 --      Weight 11/17/20 2018 119 lb 7.8 oz (54.2 kg)     Height 11/17/20 2018 5\' 6"  (1.676 m)     Head Circumference --      Peak Flow --      Pain Score 11/17/20 2017 0     Pain Loc --      Pain Edu? --      Excl. in GC? --    Constitutional: Alert and disoriented. Well appearing and in no acute distress. Eyes: Conjunctivae  are normal. PERRL. Head: Atraumatic. Nose: No congestion/rhinnorhea. Mouth/Throat: Mucous membranes are moist.  Ecchymosis to the tongue Neck: No stridor Cardiovascular: Grossly normal heart sounds.  Good peripheral circulation. Respiratory: Normal respiratory effort.  No retractions. Gastrointestinal: Soft and nontender. No distention. Musculoskeletal: No obvious deformities Neurologic:  Normal speech and language. No gross focal neurologic deficits are appreciated. Skin:  Skin is warm and dry. No rash noted. Psychiatric: Mood and affect are normal. Speech and behavior are normal.  ____________________________________________   LABS (all labs ordered are listed, but only abnormal results are displayed)  Labs Reviewed  COMPREHENSIVE METABOLIC PANEL - Abnormal; Notable for the following components:      Result Value   Sodium 134 (*)    Potassium 3.1 (*)    Glucose, Bld 169 (*)    Total Protein 5.9 (*)    Albumin 3.4 (*)    GFR, Estimated 60 (*)    All other components within normal limits  BRAIN NATRIURETIC PEPTIDE - Abnormal; Notable for the following components:   B Natriuretic Peptide 179.2 (*)    All other components within normal limits  LACTIC ACID, PLASMA - Abnormal; Notable for the following components:   Lactic Acid, Venous 3.4 (*)    All other components within normal limits  LACTIC ACID, PLASMA  CBC WITH DIFFERENTIAL/PLATELET  BLOOD GAS, VENOUS  URINALYSIS, COMPLETE (UACMP) WITH MICROSCOPIC  URINE DRUG SCREEN, QUALITATIVE ([facility] ONLY)  TROPONIN I (HIGH SENSITIVITY)  TROPONIN I (HIGH SENSITIVITY)   ____________________________________________  EKG  ED ECG REPORT I, Merwyn Katos, the attending physician, personally viewed and interpreted this ECG.  Date: 11/17/2020 EKG Time: 2044 Rate: 87 Rhythm: normal sinus rhythm QRS Axis: normal Intervals: normal ST/T Wave abnormalities: normal Narrative Interpretation: no evidence of acute  ischemia  ____________________________________________  RADIOLOGY  ED MD interpretation: CT of the head without contrast shows no evidence of acute abnormalities including no intracerebral hemorrhage, obvious masses, or significant edema  One-view portable chest x-ray shows no evidence of acute abnormalities including no pneumonia, pneumothorax, or widened mediastinum  Official radiology report(s): CT Head Wo Contrast  Result Date: 11/17/2020 CLINICAL DATA:  Mental status change, unknown cause EXAM: CT HEAD WITHOUT CONTRAST TECHNIQUE: Contiguous axial images were obtained from the base of the skull through the vertex without intravenous contrast. COMPARISON:  09/27/2020 FINDINGS: Brain: There is atrophy and chronic small vessel disease changes. No acute intracranial abnormality. Specifically, no hemorrhage, hydrocephalus, mass lesion, acute infarction, or significant intracranial injury. Vascular: No hyperdense vessel or unexpected calcification. Skull: No acute calvarial abnormality. Sinuses/Orbits: No acute findings Other: None IMPRESSION: Atrophy, chronic microvascular disease. No acute intracranial abnormality. Electronically Signed   By: Charlett Nose M.D.   On: 11/17/2020 21:18   DG Chest Port 1 View  Result Date: 11/17/2020 CLINICAL DATA:  Hypoxia EXAM: PORTABLE CHEST 1 VIEW COMPARISON:  09/27/2020 FINDINGS: Large hiatal hernia. Heart is normal size. No confluent airspace opacities or effusions. No acute bony abnormality. IMPRESSION: Large hiatal hernia. No active disease. Electronically Signed   By: Charlett Nose M.D.   On: 11/17/2020 20:44    ____________________________________________   PROCEDURES  Procedure(s) performed (including Critical Care):  Procedures   ____________________________________________   INITIAL IMPRESSION / ASSESSMENT AND PLAN / ED COURSE  As part of my medical decision making, I reviewed the following data within the electronic medical record, if  available:  Nursing notes reviewed and incorporated, Labs reviewed, EKG interpreted, Old chart reviewed, Radiograph reviewed and Notes from prior ED visits reviewed and incorporated        Patient presents after presumed seizure episode.  Patient had slow return to baseline mental and physical function per EMS. No immunosuppresion hx and had no preceding fever. No history of alcohol abuse or suspicion for toxin ingestion. Unlikely stroke, syncope. Unlikely infectious etiology. No preceding trauma.  Workup: EKG, BMP, POCT glucose and CT Brain.  Field Interventions: None ED Interventions: None  Disposition: Discharge home with primary care follow up in next 24-48 hours.      ____________________________________________   FINAL CLINICAL IMPRESSION(S) / ED DIAGNOSES  Final diagnoses:  Somnolence  Injury of tongue, initial encounter     ED Discharge Orders     None        Note:  This document was prepared using Dragon voice recognition software and may include unintentional dictation errors.    Merwyn Katos, MD 11/17/20 [phone]

## 2020-11-18 DIAGNOSIS — S00502A Unspecified superficial injury of oral cavity, initial encounter: Secondary | ICD-10-CM | POA: Diagnosis not present

## 2020-11-18 NOTE — ED Notes (Signed)
[facility] staff notified of pt's return back to residence. RN Addie made aware of pt's tx in ED and current status. No questions or concerns posed to this RN.

## 2021-01-16 LAB — BLOOD GAS, VENOUS
Acid-base deficit: 1.5 mmol/L (ref 0.0–2.0)
Bicarbonate: 25.6 mmol/L (ref 20.0–28.0)
O2 Saturation: 58 %
Patient temperature: 37
pCO2, Ven: 52 mmHg (ref 44.0–60.0)
pH, Ven: 7.3 (ref 7.250–7.430)
pO2, Ven: 34 mmHg (ref 32.0–45.0)

## 2021-01-19 ENCOUNTER — Emergency Department: Payer: Medicare Other

## 2021-01-19 ENCOUNTER — Other Ambulatory Visit: Payer: Self-pay

## 2021-01-19 ENCOUNTER — Encounter: Payer: Self-pay | Admitting: Emergency Medicine

## 2021-01-19 ENCOUNTER — Emergency Department
Admission: EM | Admit: 2021-01-19 | Discharge: 2021-01-20 | Disposition: A | Discharge status: Home / Self Care | Payer: Medicare Other | Attending: Emergency Medicine | Admitting: Emergency Medicine

## 2021-01-19 DIAGNOSIS — I10 Essential (primary) hypertension: Secondary | ICD-10-CM | POA: Insufficient documentation

## 2021-01-19 DIAGNOSIS — Z96652 Presence of left artificial knee joint: Secondary | ICD-10-CM | POA: Diagnosis not present

## 2021-01-19 DIAGNOSIS — F0390 Unspecified dementia without behavioral disturbance: Secondary | ICD-10-CM | POA: Insufficient documentation

## 2021-01-19 DIAGNOSIS — Z955 Presence of coronary angioplasty implant and graft: Secondary | ICD-10-CM | POA: Diagnosis not present

## 2021-01-19 DIAGNOSIS — E039 Hypothyroidism, unspecified: Secondary | ICD-10-CM | POA: Diagnosis not present

## 2021-01-19 DIAGNOSIS — R079 Chest pain, unspecified: Secondary | ICD-10-CM | POA: Diagnosis not present

## 2021-01-19 DIAGNOSIS — Z79899 Other long term (current) drug therapy: Secondary | ICD-10-CM | POA: Diagnosis not present

## 2021-01-19 DIAGNOSIS — I2510 Atherosclerotic heart disease of native coronary artery without angina pectoris: Secondary | ICD-10-CM | POA: Diagnosis not present

## 2021-01-19 DIAGNOSIS — E871 Hypo-osmolality and hyponatremia: Secondary | ICD-10-CM | POA: Insufficient documentation

## 2021-01-19 DIAGNOSIS — Z96641 Presence of right artificial hip joint: Secondary | ICD-10-CM | POA: Insufficient documentation

## 2021-01-19 DIAGNOSIS — Z87891 Personal history of nicotine dependence: Secondary | ICD-10-CM | POA: Insufficient documentation

## 2021-01-19 LAB — BASIC METABOLIC PANEL
Anion gap: 6 (ref 5–15)
BUN: 10 mg/dL (ref 8–23)
CO2: 30 mmol/L (ref 22–32)
Calcium: 8.6 mg/dL — ABNORMAL LOW (ref 8.9–10.3)
Chloride: 92 mmol/L — ABNORMAL LOW (ref 98–111)
Creatinine, Ser: 0.58 mg/dL (ref 0.44–1.00)
GFR, Estimated: >60 mL/min (ref >60)
Glucose, Bld: 109 mg/dL — ABNORMAL HIGH (ref 70–99)
Potassium: 4.3 mmol/L (ref 3.5–5.1)
Sodium: 128 mmol/L — ABNORMAL LOW (ref 135–145)

## 2021-01-19 LAB — CBC
HCT: 37.3 % (ref 36.0–46.0)
Hemoglobin: 13 g/dL (ref 12.0–15.0)
MCH: 32.2 pg (ref 26.0–34.0)
MCHC: 34.9 g/dL (ref 30.0–36.0)
MCV: 92.3 fL (ref 80.0–100.0)
Platelets: 249 10*3/uL (ref 150–400)
RBC: 4.04 MIL/uL (ref 3.87–5.11)
RDW: 13.8 % (ref 11.5–15.5)
WBC: 5.1 10*3/uL (ref 4.0–10.5)
nRBC: 0 % (ref 0.0–0.2)

## 2021-01-19 LAB — TROPONIN I (HIGH SENSITIVITY)
Troponin I (High Sensitivity): 10 ng/L (ref <18)
Troponin I (High Sensitivity): 11 ng/L (ref <18)

## 2021-01-19 NOTE — ED Triage Notes (Signed)
Pt to ED via EMS from [facility] [place]. Per ems pt was c/o chest pain earlier tonight and family wanted her sent here. Pt currently denies chest pain and states she doesn't know why she is here. Pt has hx dementia.

## 2021-01-19 NOTE — ED Triage Notes (Signed)
EMS brings pt in from [facility] for c/o CP

## 2021-01-19 NOTE — ED Provider Notes (Signed)
[facility] Emergency Department Provider Note  ____________________________________________   Event Date/Time   First MD Initiated Contact with Patient 01/19/21 2259     (approximate)  I have reviewed the triage vital signs and the nursing notes.   HISTORY  Chief Complaint Chest Pain    HPI Kerry Cabrera is a 87 y.o. female with history of coronary artery disease, hypertension, hyperlipidemia, hypothyroidism, TIA, dementia who presents to the emergency department from her nursing facility [facility] with complaints of chest pain today.  Patient unable to provide much history.  She denies having any chest pain currently.  Spoke with patient's daughter Kerry Cabrera by phone.  She states that patient was complaining of chest pain and that is why they sent her to the emergency department.        Past Medical History:  Diagnosis Date   Arthritis    Carotid artery occlusion    Complication of anesthesia    very confused for days   Coronary artery disease    Depression    Glaucoma    Hyperlipemia    Hypertension    h/o not on medication   Hypothyroidism    Insomnia    PUD (peptic ulcer disease)    in distant past   TIA (transient ischemic attack)     Patient Active Problem List   Diagnosis Date Noted   Dehydration    Syncope    Elevated troponin    Hypotension 09/27/2020   Syncope, vasovagal 09/27/2020   Hypocalcemia 09/27/2020   Lactic acidosis 09/27/2020   Dementia without behavioral disturbance (HCC) 09/27/2020   Acute encephalopathy 10/07/2019   Closed left hip fracture (HCC) 06/21/2019   Acute respiratory failure with hypoxia (HCC) 06/21/2019   GERD (gastroesophageal reflux disease) 06/21/2019   Depression with anxiety 06/21/2019   Fall 06/21/2019   Symptomatic bradycardia 11/05/2017   Abnormal gait 12/17/2016   Avascular necrosis of femoral head (HCC) 12/17/2016   Bursitis of hip 12/17/2016   CAD (coronary artery disease)  12/17/2016   Closed fracture of intracapsular section of femur (HCC) 12/17/2016   Closed transcervical fracture of femur (HCC) 12/17/2016   Hip pain 12/17/2016   Inflammation of sacroiliac joint (HCC) 12/17/2016   Osteoarthritis of hip 12/17/2016   Osteoarthritis of knee 12/17/2016   Post-traumatic osteoarthritis 12/17/2016   Diarrhea 09/20/2016   Hiatal hernia 09/12/2016   Acquired hypothyroidism 07/04/2016   S/P total knee arthroplasty 08/08/2015   Moderate mitral insufficiency 12/20/2014   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   Hyperlipidemia, mixed 07/09/2013    Past Surgical History:  Procedure Laterality Date   CARDIAC CATHETERIZATION     HIP PINNING,CANNULATED Left 06/21/2019   Procedure: CANNULATED HIP PINNING;  Surgeon: Christena Flake, MD;  Location: [facility] ORS;  Service: Orthopedics;  Laterality: Left;   KNEE ARTHROPLASTY Left 08/08/2015   Procedure: COMPUTER ASSISTED TOTAL KNEE ARTHROPLASTY;  Surgeon: Donato Heinz, MD;  Location: [facility] ORS;  Service: Orthopedics;  Laterality: Left;   KNEE ARTHROSCOPY     Percutaneous pinning of right femoral neck fracture     TONSILLECTOMY     TOTAL HIP ARTHROPLASTY Right 02/06/2014   Dr. Deeann Saint    Prior to Admission medications   Medication Sig Start Date End Date Taking? Authorizing Provider  acetaminophen (TYLENOL) 500 MG tablet Take 1,000 mg by mouth 2 (two) times daily.    [provider]  amLODipine (NORVASC) 5 MG tablet Take 0.5 tablets (2.5 mg total) by mouth daily. 10/01/20  Rodolph Bong, MD  Cobalamin Combinations (B12 FOLATE) 800-800 MCG CAPS Take 1 capsule by mouth daily at 12 noon.    [provider]  divalproex (DEPAKOTE) 250 MG DR tablet Take 250 mg by mouth 3 (three) times daily.    [provider]  donepezil (ARICEPT) 10 MG tablet Take 10 mg by mouth at bedtime. 03/31/19   [provider]  ferrous sulfate 325 (65 FE) MG tablet Take 325 mg by mouth daily at 12 noon.    [provider]  gabapentin (NEURONTIN) 100 MG capsule Take 100 mg by mouth at bedtime.    [provider]  levothyroxine (SYNTHROID) 137 MCG tablet Take 137 mcg by mouth daily before breakfast.    [provider]  melatonin 3 MG TABS tablet Take 6 mg by mouth at bedtime.    [provider]  omeprazole (PRILOSEC) 40 MG capsule Take 40 mg by mouth 2 (two) times daily. 03/31/19   [provider]  sucralfate (CARAFATE) 1 g tablet Take 1 g by mouth 2 (two) times daily.    [provider]  traZODone (DESYREL) 100 MG tablet Take 100 mg by mouth at bedtime. 06/08/19   [provider]    Allergies Ace inhibitors, Codeine, Gabapentin, Micardis hct [telmisartan-hctz], Prednisone, Sulfa antibiotics, and Zithromax [azithromycin]  Family History  Problem Relation Age of Onset   Prostate cancer Son    Kidney disease Neg Hx    Kidney cancer Neg Hx     Social History Social History   Tobacco Use   Smoking status: Former    Years: 12.00    Types: Cigarettes   Smokeless tobacco: Never  Substance Use Topics   Alcohol use: Yes    Comment: osccasional   Drug use: No    Review of Systems Level 5 caveat secondary to dementia  ____________________________________________   PHYSICAL EXAM:  VITAL SIGNS: ED Triage Vitals  Enc Vitals Group     BP 01/19/21 1932 (!) 143/99     Pulse Rate 01/19/21 1932 100     Resp 01/19/21 1932 18     Temp 01/19/21 1932 98.7 F (37.1 C)     Temp Source 01/19/21 1932 Oral     SpO2 01/19/21 1929 98 %     Weight 01/19/21 1934 140 lb (63.5 kg)     Height 01/19/21 1934 5\' 7"  (1.702 m)     Head Circumference --      Peak Flow --      Pain Score 01/19/21 1934 0     Pain Loc --      Pain Edu? --      Excl. in GC? --    CONSTITUTIONAL: Alert and oriented person only.  Pleasantly demented and confused.  Well-appearing; well-nourished, elderly, no distress HEAD: Normocephalic EYES: Conjunctivae clear, pupils  appear equal, EOM appear intact ENT: normal nose; moist mucous membranes NECK: Supple, normal ROM CARD: RRR; S1 and S2 appreciated; no murmurs, no clicks, no rubs, no gallops RESP: Normal chest excursion without splinting or tachypnea; breath sounds clear and equal bilaterally; no wheezes, no rhonchi, no rales, no hypoxia or respiratory distress, speaking full sentences ABD/GI: Normal bowel sounds; non-distended; soft, non-tender, no rebound, no guarding, no peritoneal signs, no hepatosplenomegaly BACK: The back appears normal EXT: Normal ROM in all joints; no deformity noted, no edema; no cyanosis, no calf tenderness or calf swelling SKIN: Normal color for age and race; warm; no rash on exposed skin NEURO: Moves  all extremities equally, normal speech, no facial asymmetry PSYCH: The patient's mood and manner are appropriate.  ____________________________________________   LABS (all labs ordered are listed, but only abnormal results are displayed)  Labs Reviewed  BASIC METABOLIC PANEL - Abnormal; Notable for the following components:      Result Value   Sodium 128 (*)    Chloride 92 (*)    Glucose, Bld 109 (*)    Calcium 8.6 (*)    All other components within normal limits  CBC  TROPONIN I (HIGH SENSITIVITY)  TROPONIN I (HIGH SENSITIVITY)   ____________________________________________  EKG   EKG Interpretation  Date/Time:  Thursday January 19 2021 19:36:52 EDT Ventricular Rate:  207 PR Interval:    QRS Duration: 84 QT Interval:  78 QTC Calculation: 144 R Axis:   245 Text Interpretation: Normal sinus rhythm Inferior-posterior infarct , age undetermined Anterolateral infarct , age undetermined Abnormal ECG No significant change since last tracing Rate is around 100 Confirmed by Rochele Raring [phone]) on 01/19/2021 11:28:28 PM        ____________________________________________  RADIOLOGY Normajean Baxter Spero Gunnels, personally viewed and evaluated these images (plain radiographs)  as part of my medical decision making, as well as reviewing the written report by the radiologist.  ED MD interpretation: Chest x-ray clear.  Official radiology report(s): DG Chest 2 View  Result Date: 01/19/2021 CLINICAL DATA:  Chest pain. EXAM: CHEST - 2 VIEW COMPARISON:  November 17, 2020 FINDINGS: There is no evidence of acute infiltrate, pleural effusion or pneumothorax. The cardiac silhouette is markedly enlarged and unchanged in size. A very large hiatal hernia is again seen. The visualized skeletal structures are unremarkable. IMPRESSION: Cardiomegaly without evidence of acute or active cardiopulmonary disease. Electronically Signed   By: Aram Candela M.D.   On: 01/19/2021 20:11    ____________________________________________   PROCEDURES  Procedure(s) performed (including Critical Care):  Procedures    ____________________________________________   INITIAL IMPRESSION / ASSESSMENT AND PLAN / ED COURSE  As part of my medical decision making, I reviewed the following data within the electronic MEDICAL RECORD NUMBER History obtained from family, Nursing notes reviewed and incorporated, Labs reviewed , EKG interpreted , Old EKG reviewed, Old chart reviewed, Radiograph reviewed , and Notes from prior ED visits         Patient here with dementia from her nursing facility with complaints of chest pain.  She has no complaints currently.  Hemodynamically stable.  EKG x2 shows no new changes compared to previous.  Troponin x2 negative.  Chest x-ray clear showing no pneumonia, edema, pneumothorax.  Doubt PE, dissection.  Discussed with patient's daughter by phone.  I feel she is safe to be discharged back to her nursing facility with close outpatient follow-up.  Daughter is also comfortable with this plan.  Labs do show a mild hyponatremia with a sodium of 128.  I do not feel this requires admission to the hospital at this time but have spoken to daughter about these results and  recommended that this be rechecked by her primary care provider in 1 week.  Daughter verbalized understanding.  At this time, I do not feel there is any life-threatening condition present. I have reviewed, interpreted and discussed all results (EKG, imaging, lab, urine as appropriate) and exam findings with patient/family. I have reviewed nursing notes and appropriate previous records.  I feel the patient is safe to be discharged home without further emergent workup and can continue workup as an outpatient as needed. Discussed usual and  customary return precautions. Patient/family verbalize understanding and are comfortable with this plan.  Outpatient follow-up has been provided as needed. All questions have been answered.  ____________________________________________   FINAL CLINICAL IMPRESSION(S) / ED DIAGNOSES  Final diagnoses:  Nonspecific chest pain  Hyponatremia     ED Discharge Orders     None       *Please note:  Jemila B Maragh was evaluated in Emergency Department on 01/20/2021 for the symptoms described in the history of present illness. She was evaluated in the context of the global COVID-19 pandemic, which necessitated consideration that the patient might be at risk for infection with the SARS-CoV-2 virus that causes COVID-19. Institutional protocols and algorithms that pertain to the evaluation of patients at risk for COVID-19 are in a state of rapid change based on information released by regulatory bodies including the CDC and federal and state organizations. These policies and algorithms were followed during the patient's care in the ED.  Some ED evaluations and interventions may be delayed as a result of limited staffing during and the pandemic.*   Note:  This document was prepared using Dragon voice recognition software and may include unintentional dictation errors.    Danijela Vessey, Layla Maw, DO 01/20/21 [id number]

## 2021-01-20 NOTE — ED Notes (Signed)
EMS called for transport back, report called to [place] at the [facility]

## 2021-03-02 IMAGING — CT CT HEAD W/O CM
4 series · 16 of 47 positions shown, 18 images · non-contrast
Comparison: 09/17/2019

CLINICAL DATA: Falls over the last 24 hours, unwitnessed. Minor
head trauma.

EXAM:
CT HEAD WITHOUT CONTRAST
TECHNIQUE: Contiguous axial images were obtained from the base of the skull
through the vertex without intravenous contrast.

[Series 2: head wo · axial · 0.42mm/px · z∈[-140,-25]mm · 7 of 31 slices shown, 9 images]
[im 4/31  brain]
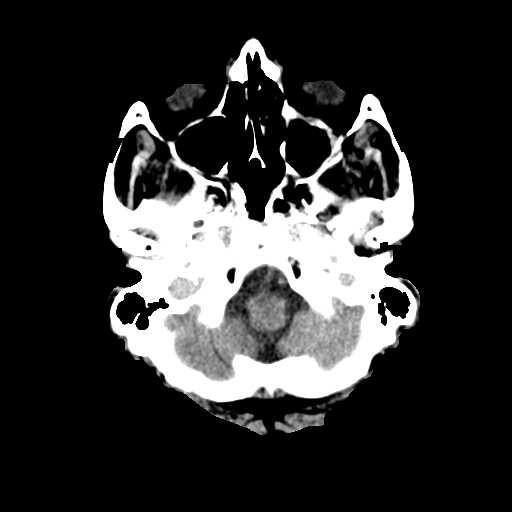
[im 4/31  bone]
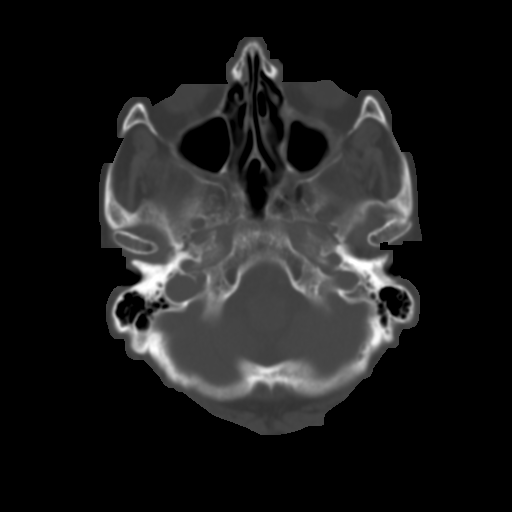
[im 8/31  brain]
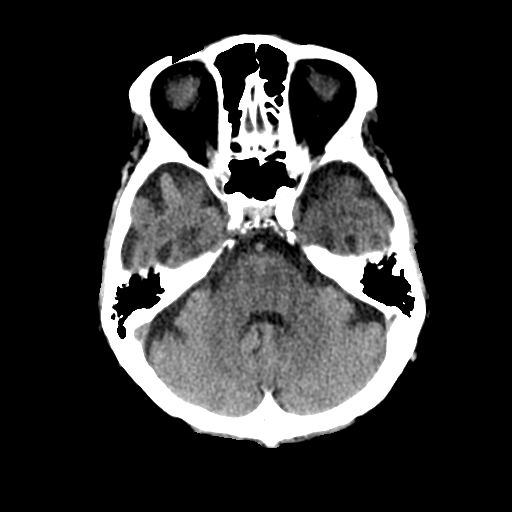
[im 12/31  brain]
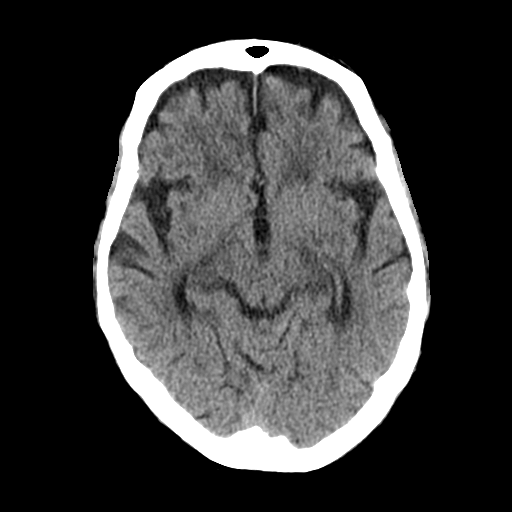
[im 16/31  brain]
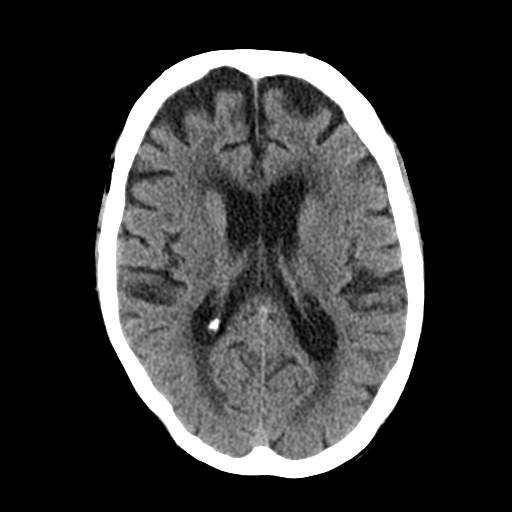
[im 19/31  brain]
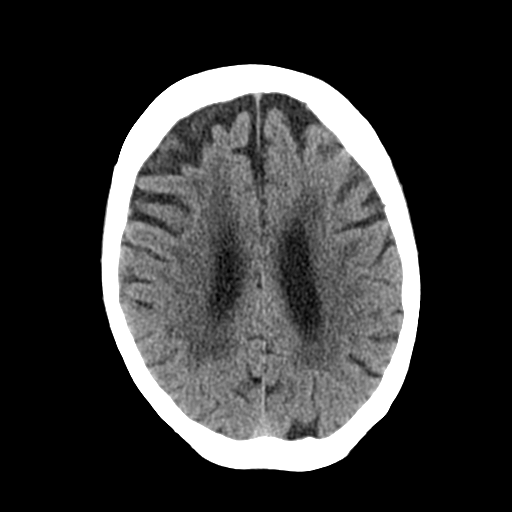
[im 19/31  bone]
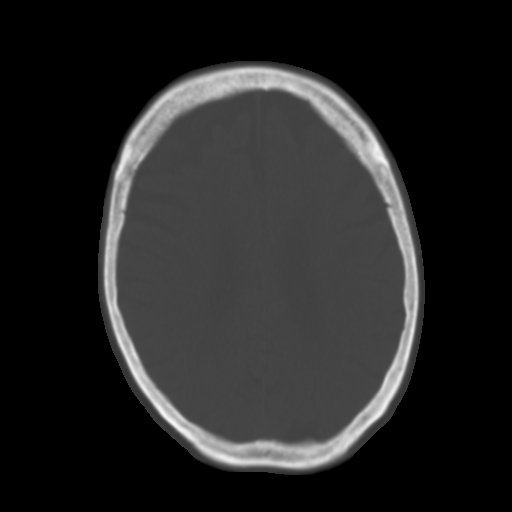
[im 23/31  brain]
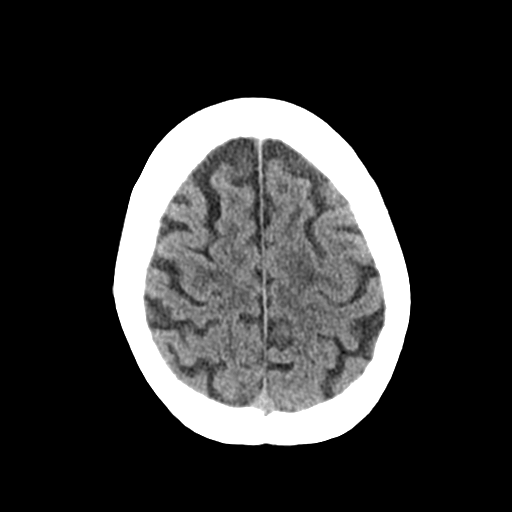
[im 27/31  brain]
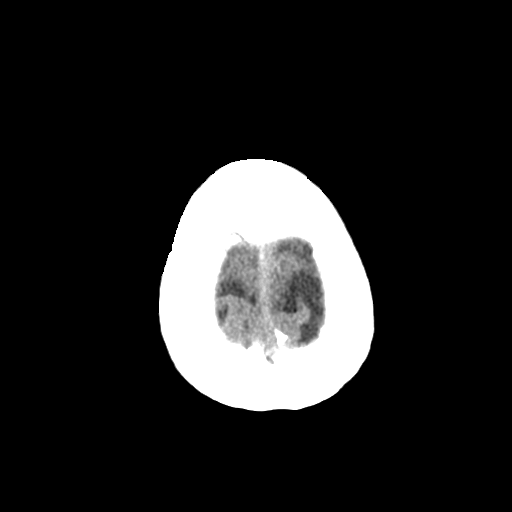

[Series 3: head bone · axial · 0.42mm/px · z∈[-141,-111]mm · 3 of 77 slices shown]
[im 8/77  bone]
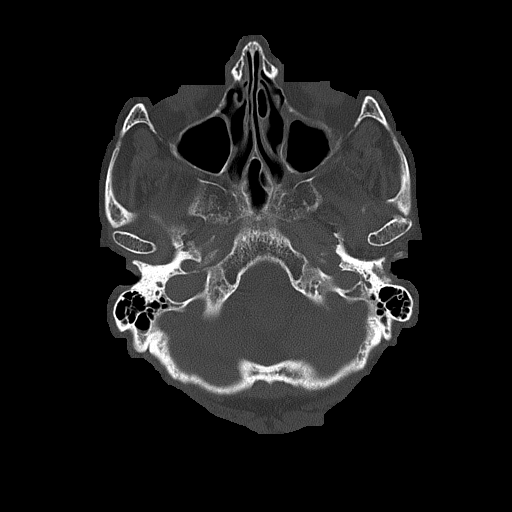
[im 16/77  bone]
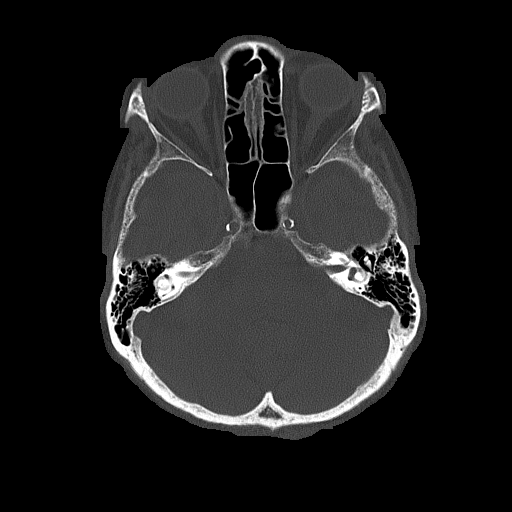
[im 23/77  bone]
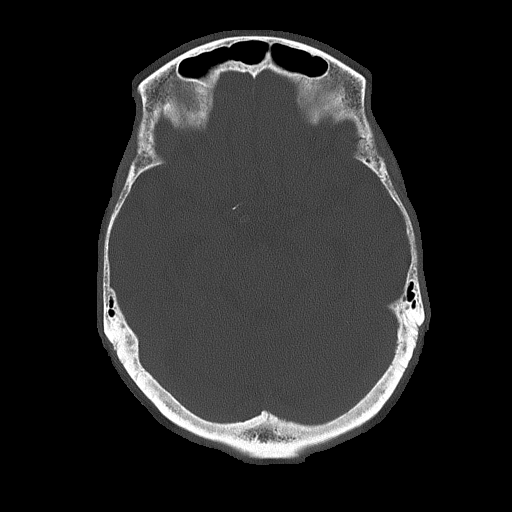

[Series 4: coronal soft tissue · coronal · 0.29mm/px · 3 of 65 slices shown]
[im 22/65  brain]
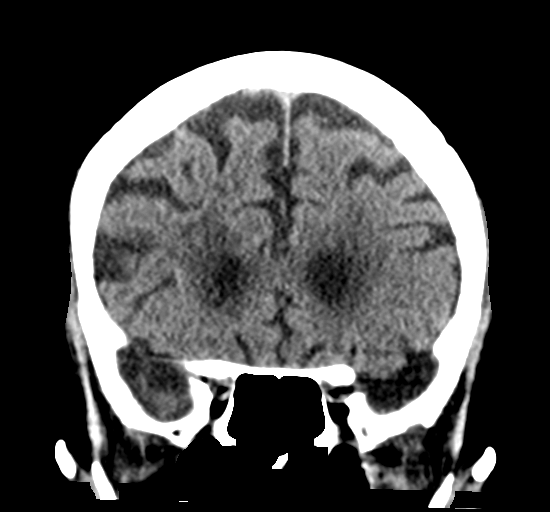
[im 29/65  brain]
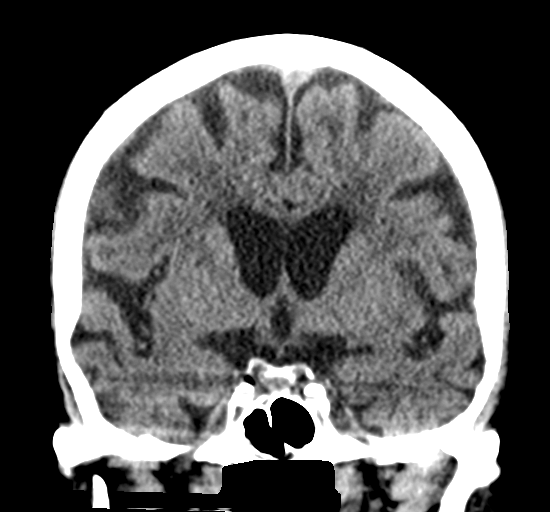
[im 36/65  brain]
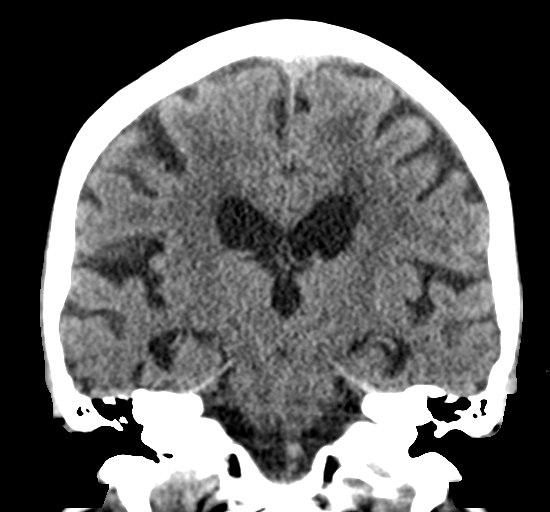

[Series 5: sagittal soft tissue · sagittal · 0.30mm/px · 3 of 54 slices shown]
[im 18/54  brain]
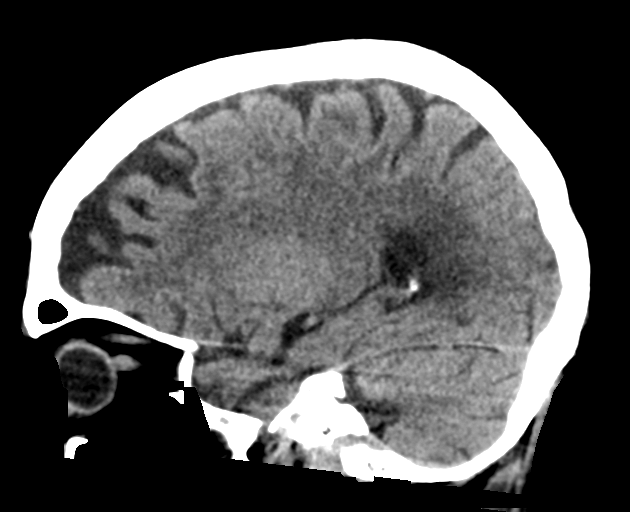
[im 27/54  brain]
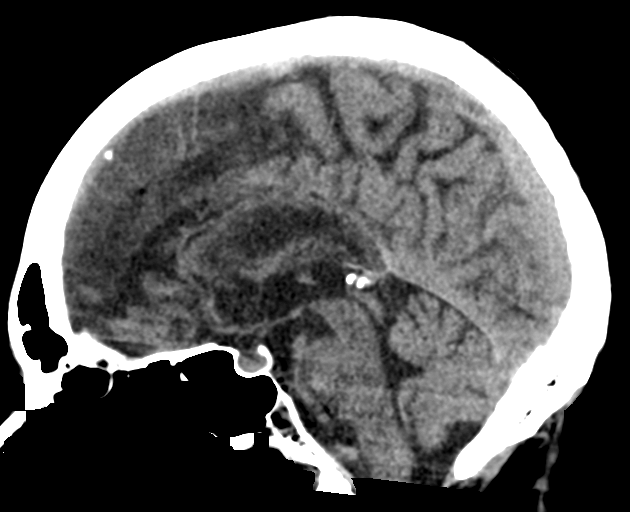
[im 36/54  brain]
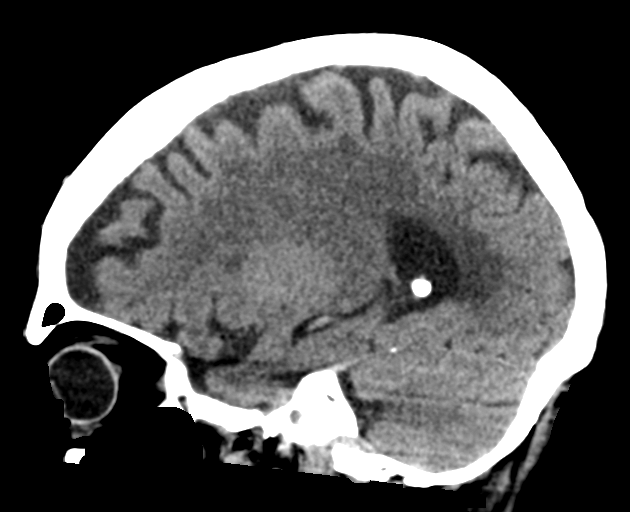

[16 of 47 positions shown; findings below may reference images not displayed]

FINDINGS: Brain: No evidence of acute infarction, hemorrhage, hydrocephalus,
extra-axial collection or mass lesion/mass effect.

There is ventricular and sulcal enlargement reflecting
age-appropriate volume loss. Patchy white matter hypoattenuation is
noted bilaterally consistent with moderate chronic microvascular
ischemic change. These findings are stable.

Vascular: No hyperdense vessel or unexpected calcification.

Skull: Normal. Negative for fracture or focal lesion.

Sinuses/Orbits: Globes and orbits are unremarkable. Minor ethmoid
and left maxillary sinus mucosal thickening.

Other: None.
IMPRESSION: 1. No acute intracranial abnormalities.
2. Age-appropriate volume loss and moderate chronic microvascular
ischemic change.

## 2021-03-24 ENCOUNTER — Emergency Department
Admission: EM | Admit: 2021-03-24 | Discharge: 2021-03-24 | Disposition: A | Discharge status: Home / Self Care | Payer: Medicare Other | Attending: Emergency Medicine | Admitting: Emergency Medicine

## 2021-03-24 ENCOUNTER — Emergency Department: Payer: Medicare Other

## 2021-03-24 ENCOUNTER — Other Ambulatory Visit: Payer: Self-pay

## 2021-03-24 DIAGNOSIS — F0390 Unspecified dementia without behavioral disturbance: Secondary | ICD-10-CM | POA: Insufficient documentation

## 2021-03-24 DIAGNOSIS — Z20822 Contact with and (suspected) exposure to covid-19: Secondary | ICD-10-CM | POA: Diagnosis not present

## 2021-03-24 DIAGNOSIS — Z79899 Other long term (current) drug therapy: Secondary | ICD-10-CM | POA: Diagnosis not present

## 2021-03-24 DIAGNOSIS — R000 Tachycardia, unspecified: Secondary | ICD-10-CM | POA: Diagnosis not present

## 2021-03-24 DIAGNOSIS — E039 Hypothyroidism, unspecified: Secondary | ICD-10-CM | POA: Insufficient documentation

## 2021-03-24 DIAGNOSIS — Z96652 Presence of left artificial knee joint: Secondary | ICD-10-CM | POA: Insufficient documentation

## 2021-03-24 DIAGNOSIS — Z96641 Presence of right artificial hip joint: Secondary | ICD-10-CM | POA: Diagnosis not present

## 2021-03-24 DIAGNOSIS — I2510 Atherosclerotic heart disease of native coronary artery without angina pectoris: Secondary | ICD-10-CM | POA: Diagnosis not present

## 2021-03-24 DIAGNOSIS — E860 Dehydration: Secondary | ICD-10-CM

## 2021-03-24 DIAGNOSIS — R55 Syncope and collapse: Secondary | ICD-10-CM | POA: Insufficient documentation

## 2021-03-24 DIAGNOSIS — I10 Essential (primary) hypertension: Secondary | ICD-10-CM | POA: Diagnosis not present

## 2021-03-24 DIAGNOSIS — Z87891 Personal history of nicotine dependence: Secondary | ICD-10-CM | POA: Diagnosis not present

## 2021-03-24 LAB — CBC WITH DIFFERENTIAL/PLATELET
Abs Immature Granulocytes: 0.02 10*3/uL (ref 0.00–0.07)
Basophils Absolute: 0 10*3/uL (ref 0.0–0.1)
Basophils Relative: 1 %
Eosinophils Absolute: 0 10*3/uL (ref 0.0–0.5)
Eosinophils Relative: 1 %
HCT: 38.5 % (ref 36.0–46.0)
Hemoglobin: 12.6 g/dL (ref 12.0–15.0)
Immature Granulocytes: 0 %
Lymphocytes Relative: 17 %
Lymphs Abs: 0.9 10*3/uL (ref 0.7–4.0)
MCH: 30.5 pg (ref 26.0–34.0)
MCHC: 32.7 g/dL (ref 30.0–36.0)
MCV: 93.2 fL (ref 80.0–100.0)
Monocytes Absolute: 0.5 10*3/uL (ref 0.1–1.0)
Monocytes Relative: 9 %
Neutro Abs: 3.7 10*3/uL (ref 1.7–7.7)
Neutrophils Relative %: 72 %
Platelets: 227 10*3/uL (ref 150–400)
RBC: 4.13 MIL/uL (ref 3.87–5.11)
RDW: 13.1 % (ref 11.5–15.5)
WBC: 5.1 10*3/uL (ref 4.0–10.5)
nRBC: 0 % (ref 0.0–0.2)

## 2021-03-24 LAB — TROPONIN I (HIGH SENSITIVITY)
Troponin I (High Sensitivity): 6 ng/L (ref <18)
Troponin I (High Sensitivity): 6 ng/L

## 2021-03-24 LAB — COMPREHENSIVE METABOLIC PANEL
ALT: 15 U/L (ref 0–44)
AST: 23 U/L (ref 15–41)
Albumin: 3 g/dL — ABNORMAL LOW (ref 3.5–5.0)
Alkaline Phosphatase: 52 U/L (ref 38–126)
Anion gap: 4 — ABNORMAL LOW (ref 5–15)
BUN: 12 mg/dL (ref 8–23)
CO2: 29 mmol/L (ref 22–32)
Calcium: 8.7 mg/dL — ABNORMAL LOW (ref 8.9–10.3)
Chloride: 98 mmol/L (ref 98–111)
Creatinine, Ser: 0.82 mg/dL (ref 0.44–1.00)
GFR, Estimated: >60 mL/min (ref >60)
Glucose, Bld: 133 mg/dL — ABNORMAL HIGH (ref 70–99)
Potassium: 4.5 mmol/L (ref 3.5–5.1)
Sodium: 131 mmol/L — ABNORMAL LOW (ref 135–145)
Total Bilirubin: 0.6 mg/dL (ref 0.3–1.2)
Total Protein: 5.8 g/dL — ABNORMAL LOW (ref 6.5–8.1)

## 2021-03-24 LAB — URINALYSIS, ROUTINE W REFLEX MICROSCOPIC
Bilirubin Urine: NEGATIVE
Glucose, UA: NEGATIVE mg/dL
Hgb urine dipstick: NEGATIVE
Ketones, ur: NEGATIVE mg/dL
Nitrite: NEGATIVE
Protein, ur: NEGATIVE mg/dL
Specific Gravity, Urine: 1.016 (ref 1.005–1.030)
pH: 7 (ref 5.0–8.0)

## 2021-03-24 LAB — RESP PANEL BY RT-PCR (FLU A&B, COVID) ARPGX2
Influenza A by PCR: NEGATIVE
Influenza B by PCR: NEGATIVE
SARS Coronavirus 2 by RT PCR: NEGATIVE

## 2021-03-24 LAB — LACTIC ACID, PLASMA: Lactic Acid, Venous: 2.8 mmol/L (ref 0.5–1.9)

## 2021-03-24 MED ORDER — LACTATED RINGERS IV BOLUS
1000.0000 mL | Freq: Once | INTRAVENOUS | Status: AC
  Administered 2021-03-24: 1000 mL via INTRAVENOUS

## 2021-03-24 MED ORDER — SODIUM CHLORIDE 0.9 % IV BOLUS
1000.0000 mL | Freq: Once | INTRAVENOUS | Status: AC
  Administered 2021-03-24: 1000 mL via INTRAVENOUS

## 2021-03-24 NOTE — ED Notes (Signed)
Sitter requested, no sitter's available- pt placed in hallway by nurses station for better visual access.

## 2021-03-24 NOTE — ED Notes (Signed)
Pt assisted to toilet 

## 2021-03-24 NOTE — ED Notes (Signed)
Pt refuses in and out catheter at this time- states she wants to try to urinate with purewick.

## 2021-03-24 NOTE — ED Notes (Signed)
Called EMS for transport to [facility]

## 2021-03-24 NOTE — ED Notes (Signed)
Called daughter per pt request. Pt speaking with daughter at this time.

## 2021-03-24 NOTE — ED Provider Notes (Signed)
[facility] Emergency Department Provider Note  ____________________________________________   Event Date/Time   First MD Initiated Contact with Patient 03/24/21 1437     (approximate)  I have reviewed the triage vital signs and the nursing notes.   HISTORY  Chief Complaint Fall    HPI Kerry Cabrera is a 87 y.o. female with history of hypertension, hyperlipidemia, coronary disease, recurrent syncopal episodes due to what sounds like orthostasis, here with possible syncopal episode.  The patient reportedly has had a very busy day.  She exercises morning, got her hair done, and had not been eating and drinking very well.  She also reportedly has been forgetting to drink boost.  She was leaving to go to lunch today when she reportedly got very weak and was found sitting near her walker, with suspected syncopal episode.  She was mildly confused.  She does not believe she hit her head.  Denies any new pain.  Denies any fever, chills, or recent illness.  She states that she just does not have much of an appetite which is why she does not eat.  Denies any bleeding.  No melena.  Hematochezia.  No palpitations.  No other complaints.    Past Medical History:  Diagnosis Date   Arthritis    Carotid artery occlusion    Complication of anesthesia    very confused for days   Coronary artery disease    Depression    Glaucoma    Hyperlipemia    Hypertension    h/o not on medication   Hypothyroidism    Insomnia    PUD (peptic ulcer disease)    in distant past   TIA (transient ischemic attack)     Patient Active Problem List   Diagnosis Date Noted   Dehydration    Syncope    Elevated troponin    Hypotension 09/27/2020   Syncope, vasovagal 09/27/2020   Hypocalcemia 09/27/2020   Lactic acidosis 09/27/2020   Dementia without behavioral disturbance (HCC) 09/27/2020   Acute encephalopathy 10/07/2019   Closed left hip fracture (HCC) 06/21/2019   Acute  respiratory failure with hypoxia (HCC) 06/21/2019   GERD (gastroesophageal reflux disease) 06/21/2019   Depression with anxiety 06/21/2019   Fall 06/21/2019   Symptomatic bradycardia 11/05/2017   Abnormal gait 12/17/2016   Avascular necrosis of femoral head (HCC) 12/17/2016   Bursitis of hip 12/17/2016   CAD (coronary artery disease) 12/17/2016   Closed fracture of intracapsular section of femur (HCC) 12/17/2016   Closed transcervical fracture of femur (HCC) 12/17/2016   Hip pain 12/17/2016   Inflammation of sacroiliac joint (HCC) 12/17/2016   Osteoarthritis of hip 12/17/2016   Osteoarthritis of knee 12/17/2016   Post-traumatic osteoarthritis 12/17/2016   Diarrhea 09/20/2016   Hiatal hernia 09/12/2016   Acquired hypothyroidism 07/04/2016   S/P total knee arthroplasty 08/08/2015   Moderate mitral insufficiency 12/20/2014   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   Hyperlipidemia, mixed 07/09/2013    Past Surgical History:  Procedure Laterality Date   CARDIAC CATHETERIZATION     HIP PINNING,CANNULATED Left 06/21/2019   Procedure: CANNULATED HIP PINNING;  Surgeon: Christena Flake, MD;  Location: [facility] ORS;  Service: Orthopedics;  Laterality: Left;   KNEE ARTHROPLASTY Left 08/08/2015   Procedure: COMPUTER ASSISTED TOTAL KNEE ARTHROPLASTY;  Surgeon: Donato Heinz, MD;  Location: [facility] ORS;  Service: Orthopedics;  Laterality: Left;   KNEE ARTHROSCOPY     Percutaneous pinning of right femoral neck fracture     TONSILLECTOMY  TOTAL HIP ARTHROPLASTY Right 02/06/2014   Dr. Deeann Saint    Prior to Admission medications   Medication Sig Start Date End Date Taking? Authorizing Provider  acetaminophen (TYLENOL) 500 MG tablet Take 1,000 mg by mouth 2 (two) times daily.    [provider]  amLODipine (NORVASC) 5 MG tablet Take 0.5 tablets (2.5 mg total) by mouth daily. 10/01/20   Rodolph Bong, MD  Cobalamin Combinations (B12 FOLATE) 800-800 MCG CAPS Take 1 capsule by mouth daily at 12 noon.     [provider]  divalproex (DEPAKOTE) 250 MG DR tablet Take 250 mg by mouth 3 (three) times daily.    [provider]  donepezil (ARICEPT) 10 MG tablet Take 10 mg by mouth at bedtime. 03/31/19   [provider]  ferrous sulfate 325 (65 FE) MG tablet Take 325 mg by mouth daily at 12 noon.    [provider]  gabapentin (NEURONTIN) 100 MG capsule Take 100 mg by mouth at bedtime.    [provider]  levothyroxine (SYNTHROID) 137 MCG tablet Take 137 mcg by mouth daily before breakfast.    [provider]  melatonin 3 MG TABS tablet Take 6 mg by mouth at bedtime.    [provider]  omeprazole (PRILOSEC) 40 MG capsule Take 40 mg by mouth 2 (two) times daily. 03/31/19   [provider]  sucralfate (CARAFATE) 1 g tablet Take 1 g by mouth 2 (two) times daily.    [provider]  traZODone (DESYREL) 100 MG tablet Take 100 mg by mouth at bedtime. 06/08/19   [provider]    Allergies Ace inhibitors, Codeine, Gabapentin, Micardis hct [telmisartan-hctz], Prednisone, Sulfa antibiotics, and Zithromax [azithromycin]  Family History  Problem Relation Age of Onset   Prostate cancer Son    Kidney disease Neg Hx    Kidney cancer Neg Hx     Social History Social History   Tobacco Use   Smoking status: Former    Years: 12.00    Types: Cigarettes   Smokeless tobacco: Never  Substance Use Topics   Alcohol use: Yes    Comment: osccasional   Drug use: No    Review of Systems  Review of Systems  Constitutional:  Positive for fatigue. Negative for chills and fever.  HENT:  Negative for sore throat.   Respiratory:  Negative for shortness of breath.   Cardiovascular:  Negative for chest pain.  Gastrointestinal:  Negative for abdominal pain.  Genitourinary:  Negative for flank pain.  Musculoskeletal:  Negative for neck pain.  Skin:  Negative for rash and wound.  Allergic/Immunologic: Negative for  immunocompromised state.  Neurological:  Positive for syncope and light-headedness. Negative for weakness and numbness.  Hematological:  Does not bruise/bleed easily.  All other systems reviewed and are negative.   ____________________________________________  PHYSICAL EXAM:      VITAL SIGNS: ED Triage Vitals  Enc Vitals Group     BP 03/24/21 1435 (!) 87/59     Pulse Rate 03/24/21 1435 (!) 118     Resp 03/24/21 1435 19     Temp 03/24/21 1435 98.1 F (36.7 C)     Temp Source 03/24/21 1435 Oral     SpO2 03/24/21 1435 95 %     Weight --      Height --      Head Circumference --      Peak Flow --      Pain Score 03/24/21 1436 0  Pain Loc --      Pain Edu? --      Excl. in GC? --      Physical Exam Vitals and nursing note reviewed.  Constitutional:      General: She is not in acute distress.    Appearance: She is well-developed.  HENT:     Head: Normocephalic and atraumatic.     Mouth/Throat:     Mouth: Mucous membranes are dry.  Eyes:     Conjunctiva/sclera: Conjunctivae normal.  Cardiovascular:     Rate and Rhythm: Regular rhythm. Tachycardia present.     Heart sounds: Normal heart sounds. No murmur heard.   No friction rub.  Pulmonary:     Effort: Pulmonary effort is normal. No respiratory distress.     Breath sounds: Normal breath sounds. No wheezing or rales.  Abdominal:     General: There is no distension.     Palpations: Abdomen is soft.     Tenderness: There is no abdominal tenderness.  Musculoskeletal:     Cervical back: Neck supple.  Skin:    General: Skin is warm.     Capillary Refill: Capillary refill takes less than 2 seconds.  Neurological:     Mental Status: She is alert and oriented to person, place, and time.     Cranial Nerves: Cranial nerves 2-12 are intact.     Sensory: Sensation is intact.     Motor: Motor function is intact. No abnormal muscle tone.      ____________________________________________   LABS (all labs ordered are  listed, but only abnormal results are displayed)  Labs Reviewed  COMPREHENSIVE METABOLIC PANEL - Abnormal; Notable for the following components:      Result Value   Sodium 131 (*)    Glucose, Bld 133 (*)    Calcium 8.7 (*)    Total Protein 5.8 (*)    Albumin 3.0 (*)    Anion gap 4 (*)    All other components within normal limits  LACTIC ACID, PLASMA - Abnormal; Notable for the following components:   Lactic Acid, Venous 2.8 (*)    All other components within normal limits  RESP PANEL BY RT-PCR (FLU A&B, COVID) ARPGX2  CULTURE, BLOOD (SINGLE)  CBC WITH DIFFERENTIAL/PLATELET  URINALYSIS, ROUTINE W REFLEX MICROSCOPIC  TROPONIN I (HIGH SENSITIVITY)  TROPONIN I (HIGH SENSITIVITY)    ____________________________________________  EKG: Sinus tachycardia, ventricular rate 121.  PR 338, QRS 91, QTc 41.  No acute ST elevations or depressions.  No EKG evidence of acute ischemia or infarct. ________________________________________  RADIOLOGY All imaging, including plain films, CT scans, and ultrasounds, independently reviewed by me, and interpretations confirmed via formal radiology reads.  ED MD interpretation:   CXR: Large hiatal hernia with bibasilar atelectasis  Official radiology report(s): CT HEAD WO CONTRAST ( )  Result Date: 03/24/2021 CLINICAL DATA:  Fall in elevator unconscious EXAM: CT HEAD WITHOUT CONTRAST CT CERVICAL SPINE WITHOUT CONTRAST TECHNIQUE: Multidetector CT imaging of the head and cervical spine was performed following the standard protocol without intravenous contrast. Multiplanar CT image reconstructions of the cervical spine were also generated. COMPARISON:  CT brain and cervical spine 09/27/2020, brain MRI 10/07/2019, CT brain 11/17/2020 FINDINGS: CT HEAD FINDINGS Brain: No acute territorial infarction, hemorrhage or intracranial mass. Advanced atrophy. Moderate severe white matter hypodensity consistent with chronic small vessel ischemic change. Stable ventricle  size. Vascular: No hyperdense vessels.  Carotid vascular calcification. Skull: Normal. Negative for fracture or focal lesion. Sinuses/Orbits: No acute finding. Other: None  CT CERVICAL SPINE FINDINGS Alignment: Normal. Skull base and vertebrae: No acute fracture. No primary bone lesion or focal pathologic process. Stable sclerotic focus within the anterior arch of C1. Soft tissues and spinal canal: No prevertebral fluid or swelling. No visible canal hematoma. Disc levels: Multilevel degenerative changes. Moderate disc space narrowing C5-C6 and C6-C7. Facet degenerative changes at multiple levels. Upper chest: Apical scarring.  Small calcified granuloma left apex. Other: None IMPRESSION: 1. No CT evidence for acute intracranial abnormality. Atrophy and chronic small vessel ischemic changes of the white matter 2. Degenerative changes of the cervical spine. No acute osseous abnormality Electronically Signed   By: Jasmine Pang M.D.   On: 03/24/2021 15:44   CT Cervical Spine Wo Contrast  Result Date: 03/24/2021 CLINICAL DATA:  Fall in elevator unconscious EXAM: CT HEAD WITHOUT CONTRAST CT CERVICAL SPINE WITHOUT CONTRAST TECHNIQUE: Multidetector CT imaging of the head and cervical spine was performed following the standard protocol without intravenous contrast. Multiplanar CT image reconstructions of the cervical spine were also generated. COMPARISON:  CT brain and cervical spine 09/27/2020, brain MRI 10/07/2019, CT brain 11/17/2020 FINDINGS: CT HEAD FINDINGS Brain: No acute territorial infarction, hemorrhage or intracranial mass. Advanced atrophy. Moderate severe white matter hypodensity consistent with chronic small vessel ischemic change. Stable ventricle size. Vascular: No hyperdense vessels.  Carotid vascular calcification. Skull: Normal. Negative for fracture or focal lesion. Sinuses/Orbits: No acute finding. Other: None CT CERVICAL SPINE FINDINGS Alignment: Normal. Skull base and vertebrae: No acute fracture.  No primary bone lesion or focal pathologic process. Stable sclerotic focus within the anterior arch of C1. Soft tissues and spinal canal: No prevertebral fluid or swelling. No visible canal hematoma. Disc levels: Multilevel degenerative changes. Moderate disc space narrowing C5-C6 and C6-C7. Facet degenerative changes at multiple levels. Upper chest: Apical scarring.  Small calcified granuloma left apex. Other: None IMPRESSION: 1. No CT evidence for acute intracranial abnormality. Atrophy and chronic small vessel ischemic changes of the white matter 2. Degenerative changes of the cervical spine. No acute osseous abnormality Electronically Signed   By: Jasmine Pang M.D.   On: 03/24/2021 15:44   DG Chest Portable 1 View  Result Date: 03/24/2021 CLINICAL DATA:  Weakness. EXAM: PORTABLE CHEST 1 VIEW COMPARISON:  01/19/2021 FINDINGS: The patient is rotated to the right which limits assessment of the mediastinal structures. A large hiatal hernia is again noted with associated right basilar atelectasis. Minimal atelectasis is noted in the left lung base. No pulmonary edema, sizable pleural effusion, or pneumothorax is identified. No acute osseous abnormality is seen. IMPRESSION: Large hiatal hernia with bibasilar atelectasis. Electronically Signed   By: Sebastian Ache M.D.   On: 03/24/2021 14:59    ____________________________________________  PROCEDURES   Procedure(s) performed (including Critical Care):  Procedures  ____________________________________________  INITIAL IMPRESSION / MDM / ASSESSMENT AND PLAN / ED COURSE  As part of my medical decision making, I reviewed the following data within the electronic MEDICAL RECORD NUMBER Nursing notes reviewed and incorporated, Old chart reviewed, Notes from prior ED visits, and [place] Controlled Substance Database       *Kerry Cabrera was evaluated in Emergency Department on 03/24/2021 for the symptoms described in the history of present illness. She was  evaluated in the context of the global COVID-19 pandemic, which necessitated consideration that the patient might be at risk for infection with the SARS-CoV-2 virus that causes COVID-19. Institutional protocols and algorithms that pertain to the evaluation of patients at risk for COVID-19 are in a  state of rapid change based on information released by regulatory bodies including the CDC and federal and state organizations. These policies and algorithms were followed during the patient's care in the ED.  Some ED evaluations and interventions may be delayed as a result of limited staffing during the pandemic.*     Medical Decision Making:  87 yo well appearing female here with syncopal episode. Pt has h/o same due to dehydration from generally poor PO intake/appetite. On exam, pt is in NAD and at her neurological baseline, per daughter who is present. Pt does appear mildly dehydrated but o/w is nontoxic. Will check screening labs, start IVF, and check orthostatics. EKG shows sinus tachycardia, no arrhythmia or ectopy. Suspect that if pt feels improved w/ fluids and labs are reassuring, can be managed reasonably as an outpatient. No signs to suggest ischemia, arrhythmia, or stroke.  ____________________________________________  FINAL CLINICAL IMPRESSION(S) / ED DIAGNOSES  Final diagnoses:  Syncope, unspecified syncope type     MEDICATIONS GIVEN DURING THIS VISIT:  Medications  lactated ringers bolus 1,000 mL (0 mLs Intravenous Stopped 03/24/21 1626)  sodium chloride 0.9 % bolus 1,000 mL (1,000 mLs Intravenous New Bag/Given 03/24/21 1700)     ED Discharge Orders     None        Note:  This document was prepared using Dragon voice recognition software and may include unintentional dictation errors.   Shaune Pollack, MD 03/24/21 1725

## 2021-03-24 NOTE — ED Notes (Signed)
Pt given a sandwich tray and a cup of sprite.  

## 2021-03-24 NOTE — ED Notes (Signed)
Report given to [facility].

## 2021-03-24 NOTE — ED Notes (Signed)
Pt continuously calls out and gets out of bed despite redirection.

## 2021-03-24 NOTE — ED Notes (Signed)
Attempted to call daughter for alternate transportation- no answer.

## 2021-03-24 NOTE — ED Triage Notes (Addendum)
Pt from [facility] after fall in elevator, was found unconscious . Pt has hx of falls due to dehydration. No obvious deformity of bleeding noted. Pt is alert, confused- per EMS pt has dementia and is baseline. Pt's BP was 90/63 on ems arrival, 63/42 en route. BGL was 204. Pt c/o dizziness and nausea, denies pain.

## 2021-03-24 NOTE — ED Provider Notes (Signed)
Patient care assumed from Dr. Erma Heritage.  Patient's work-up is overall reassuring.  Lactate is elevated most consistent with dehydration.  Patient states he is ready to go home does not want to wait for the repeat lactate.  Given the patient's clinically well appearance reassuring vital signs and reassuring work-up I believe the patient is safe for discharge back to her nursing facility.   Minna Antis, MD 03/24/21 1931

## 2021-03-29 LAB — CULTURE, BLOOD (SINGLE)
Culture: NO GROWTH
Special Requests: ADEQUATE

## 2021-11-18 DIAGNOSIS — M546 Pain in thoracic spine: Secondary | ICD-10-CM | POA: Diagnosis not present

## 2021-11-18 DIAGNOSIS — Y92129 Unspecified place in nursing home as the place of occurrence of the external cause: Secondary | ICD-10-CM | POA: Insufficient documentation

## 2021-11-18 DIAGNOSIS — W01198A Fall on same level from slipping, tripping and stumbling with subsequent striking against other object, initial encounter: Secondary | ICD-10-CM | POA: Insufficient documentation

## 2021-11-18 DIAGNOSIS — F0390 Unspecified dementia without behavioral disturbance: Secondary | ICD-10-CM | POA: Diagnosis not present

## 2021-11-18 DIAGNOSIS — R0789 Other chest pain: Secondary | ICD-10-CM | POA: Diagnosis present

## 2021-11-19 ENCOUNTER — Encounter: Payer: Self-pay | Admitting: Emergency Medicine

## 2021-11-19 ENCOUNTER — Emergency Department: Payer: Medicare Other

## 2021-11-19 ENCOUNTER — Emergency Department
Admission: EM | Admit: 2021-11-19 | Discharge: 2021-11-19 | Disposition: A | Discharge status: Skilled Nursing Facility | Payer: Medicare Other | Attending: Emergency Medicine | Admitting: Emergency Medicine

## 2021-11-19 DIAGNOSIS — R0789 Other chest pain: Secondary | ICD-10-CM | POA: Diagnosis not present

## 2021-11-19 DIAGNOSIS — S2231XA Fracture of one rib, right side, initial encounter for closed fracture: Secondary | ICD-10-CM

## 2021-11-19 LAB — COMPREHENSIVE METABOLIC PANEL
ALT: 17 U/L (ref 0–44)
AST: 25 U/L (ref 15–41)
Albumin: 3.6 g/dL (ref 3.5–5.0)
Alkaline Phosphatase: 55 U/L (ref 38–126)
Anion gap: 9 (ref 5–15)
BUN: 14 mg/dL (ref 8–23)
CO2: 25 mmol/L (ref 22–32)
Calcium: 8.9 mg/dL (ref 8.9–10.3)
Chloride: 102 mmol/L (ref 98–111)
Creatinine, Ser: 0.66 mg/dL (ref 0.44–1.00)
GFR, Estimated: >60 mL/min (ref >60)
Glucose, Bld: 93 mg/dL (ref 70–99)
Potassium: 4 mmol/L (ref 3.5–5.1)
Sodium: 136 mmol/L (ref 135–145)
Total Bilirubin: 0.9 mg/dL (ref 0.3–1.2)
Total Protein: 6.8 g/dL (ref 6.5–8.1)

## 2021-11-19 LAB — URINALYSIS, ROUTINE W REFLEX MICROSCOPIC
Bacteria, UA: NONE SEEN
Bilirubin Urine: NEGATIVE
Glucose, UA: NEGATIVE mg/dL
Hgb urine dipstick: NEGATIVE
Ketones, ur: NEGATIVE mg/dL
Leukocytes,Ua: NEGATIVE
Nitrite: NEGATIVE
Protein, ur: NEGATIVE mg/dL
Specific Gravity, Urine: 1.004 — ABNORMAL LOW (ref 1.005–1.030)
Squamous Epithelial / HPF: NONE SEEN (ref 0–5)
pH: 8 (ref 5.0–8.0)

## 2021-11-19 LAB — CBC WITH DIFFERENTIAL/PLATELET
Abs Immature Granulocytes: 0.05 10*3/uL (ref 0.00–0.07)
Basophils Absolute: 0 10*3/uL (ref 0.0–0.1)
Basophils Relative: 1 %
Eosinophils Absolute: 0 10*3/uL (ref 0.0–0.5)
Eosinophils Relative: 0 %
HCT: 47 % — ABNORMAL HIGH (ref 36.0–46.0)
Hemoglobin: 15.2 g/dL — ABNORMAL HIGH (ref 12.0–15.0)
Immature Granulocytes: 1 %
Lymphocytes Relative: 13 %
Lymphs Abs: 0.9 10*3/uL (ref 0.7–4.0)
MCH: 29.7 pg (ref 26.0–34.0)
MCHC: 32.3 g/dL (ref 30.0–36.0)
MCV: 92 fL (ref 80.0–100.0)
Monocytes Absolute: 0.5 10*3/uL (ref 0.1–1.0)
Monocytes Relative: 7 %
Neutro Abs: 5.9 10*3/uL (ref 1.7–7.7)
Neutrophils Relative %: 78 %
Platelets: 199 10*3/uL (ref 150–400)
RBC: 5.11 MIL/uL (ref 3.87–5.11)
RDW: 13.7 % (ref 11.5–15.5)
WBC: 7.5 10*3/uL (ref 4.0–10.5)
nRBC: 0 % (ref 0.0–0.2)

## 2021-11-19 MED ORDER — ACETAMINOPHEN 500 MG PO TABS
1000.0000 mg | ORAL_TABLET | Freq: Once | ORAL | Status: AC
Start: 2021-11-19 — End: 2021-11-19

## 2021-11-19 MED ORDER — LIDOCAINE 5 % EX PTCH
1.0000 | MEDICATED_PATCH | CUTANEOUS | Status: DC
  Administered 2021-11-19: 1 via TRANSDERMAL
  Filled 2021-11-19: qty 1

## 2021-11-19 MED ORDER — LIDOCAINE 5 % EX PTCH: 1.0000 | MEDICATED_PATCH | Freq: Two times a day (BID) | CUTANEOUS | 0 refills | Status: DC

## 2021-11-19 MED ORDER — ACETAMINOPHEN 500 MG PO TABS
ORAL_TABLET | ORAL | Status: AC
  Administered 2021-11-19: 1000 mg via ORAL
  Filled 2021-11-19: qty 2

## 2021-11-19 NOTE — ED Notes (Signed)
Report to erika, rn.  

## 2021-11-19 NOTE — ED Triage Notes (Signed)
First RN Note: Pt to ED via [facility] with c/o mechanical fall from [facility] of Kerry Cabrera. Per EMS pt with hx of dementia. Per EMS pt had unwitnessed fall, was found by staff laying on her back and was propped up with pillows. Per EMS no dizziness, no LOC. Pt with DNR form on arrival to ED. Per EMS pt c/o neck and back pain at this time.

## 2021-11-19 NOTE — Discharge Instructions (Addendum)
Use Tylenol for pain and fevers.  Up to 1000 mg per dose, up to 4 times per day.  Do not take more than 4000 mg of Tylenol/acetaminophen within 24 hours.. ° °Please use lidocaine patches at your site of pain.  Apply 1 patch at a time, leave on for 12 hours, then remove for 12 hours.  12 hours on, 12 hours off.  Do not apply more than 1 patch at a time. ° °

## 2021-11-19 NOTE — ED Notes (Signed)
Pt resting with eyes closed, chest rise and fall noted will monitor.  

## 2021-11-19 NOTE — ED Provider Notes (Signed)
[facility] Provider Note    Event Date/Time   First MD Initiated Contact with Patient 11/19/21 [phone]     (approximate)   History   Fall   HPI  Kerry Cabrera is a 88 y.o. female who presents to the ED for evaluation of Fall   Patient presents to the ED for evaluation after fall.  I initially evaluated her in triage and she tells me that she reached forward to get a gift from her grand child when she accidentally fell back and struck her spine on the ground.  I evaluate her 1 to 2 hours later after she is actually roomed and she does not remember what happened.  She reports feeling fine and is requesting orange juice, once I moved for her and examine her, she is complaining of back pain and right-sided chest pain.  History somewhat limited due to her dementia.  No anticoagulation.  Physical Exam   Triage Vital Signs: ED Triage Vitals [11/19/21 0011]  Enc Vitals Group     BP 118/75     Pulse Rate (!) 117     Resp 20     Temp 97.7 F (36.5 C)     Temp Source Oral     SpO2 98 %     Weight      Height      Head Circumference      Peak Flow      Pain Score      Pain Loc      Pain Edu?      Excl. in GC?     Most recent vital signs: Vitals:   11/19/21 0011  BP: 118/75  Pulse: (!) 117  Resp: 20  Temp: 97.7 F (36.5 C)  SpO2: 98%    General: Awake, no distress.  CV:  Good peripheral perfusion.  Resp:  Normal effort.  Abd:  No distention.  MSK:  No deformity noted.  No signs of trauma to the back.  Has some mild tenderness to the lower T/upper L-spine but no step-offs. Tenderness to the right-sided chest wall without overlying signs of trauma. Neuro:  No focal deficits appreciated. Other:     ED Results / Procedures / Treatments   Labs (all labs ordered are listed, but only abnormal results are displayed) Labs Reviewed  CBC WITH DIFFERENTIAL/PLATELET - Abnormal; Notable for the following components:      Result Value    Hemoglobin 15.2 (*)    HCT 47.0 (*)    All other components within normal limits  URINALYSIS, ROUTINE W REFLEX MICROSCOPIC - Abnormal; Notable for the following components:   Color, Urine COLORLESS (*)    APPearance CLEAR (*)    Specific Gravity, Urine 1.004 (*)    All other components within normal limits  COMPREHENSIVE METABOLIC PANEL    EKG Regular narrow complex rhythm that is sinus versus junctional rhythm with a rate of 116 bpm.  Right bundle morphology.  No STEMI.  RADIOLOGY CT head interpreted by me without evidence of acute intracranial pathology CT no charge chest interpreted by me without evidence of PTX  Official radiology report(s): CT Lumbar Spine Wo Contrast  Result Date: 11/19/2021 CLINICAL DATA:  Initial evaluation for acute trauma from a fall. EXAM: CT LUMBAR SPINE WITHOUT CONTRAST TECHNIQUE: Multidetector CT imaging of the lumbar spine was performed without intravenous contrast administration. Multiplanar CT image reconstructions were also generated. RADIATION DOSE REDUCTION: This exam was performed according to the departmental dose-optimization program  which includes automated exposure control, adjustment of the mA and/or kV according to patient size and/or use of iterative reconstruction technique. COMPARISON:  Prior CT from 10/07/2019. FINDINGS: Segmentation: Standard. Lowest well-formed disc space labeled the L5-S1 level. Alignment: 3 mm facet mediated anterolisthesis of L4 on L5. Alignment otherwise normal preservation of the normal lumbar lordosis. Vertebrae: Vertebral body height maintained without acute or chronic fracture. Visualized sacrum and pelvis intact. No worrisome osseous lesions. Underlying osteopenia noted. Paraspinal and other soft tissues: Paraspinous soft tissues demonstrate no acute finding. Advanced aorto bi-iliac atherosclerotic disease. Nonobstructive right renal nephrolithiasis. 2.1 cm stone present within the gallbladder. Colonic diverticulosis  noted. Disc levels: L1-2: Mild disc bulge with bilateral facet hypertrophy. No stenosis. L2-3: Mild disc bulge with bilateral facet hypertrophy. No significant spinal stenosis. Foramina remain patent. L3-4: Mild disc bulge with bilateral facet hypertrophy. No significant spinal stenosis. Foramina remain patent. L4-5: Trace anterolisthesis. Mild disc bulge. Advanced bilateral facet arthrosis. Resultant severe spinal stenosis. Mild bilateral L4 foraminal narrowing. L5-S1: Advanced degenerative intervertebral disc space narrowing with diffuse disc bulge and disc desiccation. Superimposed central disc protrusion with slight inferior migration. Moderate facet hypertrophy. Resultant mild to moderate bilateral subarticular stenosis. Mild to moderate bilateral L5 foraminal narrowing. IMPRESSION: 1. No acute traumatic injury within the lumbar spine. 2. 3 mm facet mediated anterolisthesis of L4 on L5 with resultant severe spinal stenosis. 3. Nonobstructive right renal nephrolithiasis. 4. Cholelithiasis. 5. Colonic diverticulosis. 6. Aortic Atherosclerosis (ICD10-I70.0). Electronically Signed   By: Rise Mu M.D.   On: 11/19/2021 02:41   CT Thoracic Spine Wo Contrast  Result Date: 11/19/2021 CLINICAL DATA:  Initial evaluation for acute trauma, fall. EXAM: CT THORACIC SPINE WITHOUT CONTRAST TECHNIQUE: Multidetector CT images of the thoracic were obtained using the standard protocol without intravenous contrast. RADIATION DOSE REDUCTION: This exam was performed according to the departmental dose-optimization program which includes automated exposure control, adjustment of the mA and/or kV according to patient size and/or use of iterative reconstruction technique. COMPARISON:  Prior CT from 10/07/2019. FINDINGS: Alignment: Exaggeration of the normal thoracic kyphosis. No listhesis. Vertebrae: Compression deformity involving the T4 vertebral body with up to 50% height loss without bony retropulsion, chronic in  appearance. Additional compression deformity involving the T11 vertebral body with up to 70% height loss and 3 mm bony retropulsion. This is also chronic in appearance by CT. Height loss at this level is similar to perhaps minimally progressed as compared to prior CT from 10/07/2019. Otherwise, vertebral body height maintained with no acute fracture identified. Partially visualized ribs intact. No worrisome osseous lesions. Paraspinal and other soft tissues: Paraspinous soft tissues demonstrate no acute finding. Advanced aortic atherosclerosis. Large hiatal hernia with the majority of the stomach at the right lung base. Disc levels: No significant disc pathology evident by CT. No appreciable spinal stenosis. Foramina are largely patent. IMPRESSION: 1. No acute traumatic injury within the thoracic spine. 2. Chronic compression deformities involving the T4 and T11 vertebral bodies, with up to 70% height loss and 3 mm bony retropulsion at T11, similar to perhaps minimally progressed from prior CT from 10/07/2019. 3. Large hiatal hernia with the majority of the stomach at the right lung base. 4. Aortic Atherosclerosis (ICD10-I70.0). Electronically Signed   By: Rise Mu M.D.   On: 11/19/2021 02:29   CT Cervical Spine Wo Contrast  Result Date: 11/19/2021 CLINICAL DATA:  Unwitnessed fall. EXAM: CT CERVICAL SPINE WITHOUT CONTRAST TECHNIQUE: Multidetector CT imaging of the cervical spine was performed without intravenous contrast. Multiplanar CT  image reconstructions were also generated. RADIATION DOSE REDUCTION: This exam was performed according to the departmental dose-optimization program which includes automated exposure control, adjustment of the mA and/or kV according to patient size and/or use of iterative reconstruction technique. COMPARISON:  March 24, 2021 FINDINGS: Alignment: There is stable, approximately 2 mm retrolisthesis of the C5 vertebral body on C6. Skull base and vertebrae: No acute  fracture. A stable, benign appearing 6 mm sclerotic focus is seen within the anterior arch of C1 on the right. Soft tissues and spinal canal: No prevertebral fluid or swelling. No visible canal hematoma. Disc levels: Mild to moderate severity multilevel endplate sclerosis is seen throughout all levels of the cervical spine. Mild to moderate severity anterior osteophyte formation is also seen at the levels of C3-C4, C4-C5, C5-C6 and C6-C7. There is marked severity narrowing of the anterior atlantoaxial articulation. Moderate severity posterior intervertebral disc space narrowing is seen at the levels of C4-C5, C5-C6 and C6-C7. Bilateral moderate to marked severity multilevel facet joint hypertrophy is noted. Upper chest: Negative. Other: None. IMPRESSION: 1. No acute fracture or subluxation of the cervical spine. 2. Moderate severity multilevel degenerative changes, as described above. 3. Stable, approximately 2 mm retrolisthesis of the C5 vertebral body on C6. Electronically Signed   By: Aram Candela M.D.   On: 11/19/2021 01:23   CT HEAD WO CONTRAST ( )  Result Date: 11/19/2021 CLINICAL DATA:  Unwitnessed fall. EXAM: CT HEAD WITHOUT CONTRAST TECHNIQUE: Contiguous axial images were obtained from the base of the skull through the vertex without intravenous contrast. RADIATION DOSE REDUCTION: This exam was performed according to the departmental dose-optimization program which includes automated exposure control, adjustment of the mA and/or kV according to patient size and/or use of iterative reconstruction technique. COMPARISON:  March 24, 2021 FINDINGS: Brain: There is mild cerebral atrophy with widening of the extra-axial spaces and ventricular dilatation. There are areas of decreased attenuation within the white matter tracts of the supratentorial brain, consistent with microvascular disease changes. Vascular: No hyperdense vessel or unexpected calcification. Skull: Normal. Negative for fracture or  focal lesion. Sinuses/Orbits: No acute finding. Other: None. IMPRESSION: 1. No acute intracranial abnormality. 2. Generalized cerebral atrophy with chronic white matter small vessel ischemic changes. Electronically Signed   By: Aram Candela M.D.   On: 11/19/2021 01:19    PROCEDURES and INTERVENTIONS:  Procedures  Medications - No data to display   IMPRESSION / MDM / ASSESSMENT AND PLAN / ED COURSE  I reviewed the triage vital signs and the nursing notes.  Differential diagnosis includes, but is not limited to, stroke, spinal fracture, cord compression, PTX, rib fracture, cystitis  {Patient presents with symptoms of an acute illness or injury that is potentially life-threatening.  Pleasantly demented 88 year old female presents to the ED after a mechanical fall with evidence of a single rib fracture and suitable for return to facility with recommendations for nonnarcotic multimodal analgesia.  Noted to be tachycardic in triage, but she remains hemodynamically stable.  Screening blood work is benign with normal metabolic panel and CBC.  Urine without infectious features.  CT imaging of her head and spine obtained initially without evidence of acute traumatic pathology, after I examine her again we add on the no charge CT chest which confirms a rib fracture around ribs 10, which correlates with her examination findings of tenderness.  No barriers to outpatient management.  We will provide lidocaine patches in addition to Tylenol, discussed return precautions and she can return to her facility.  FINAL CLINICAL IMPRESSION(S) / ED DIAGNOSES   Final diagnoses:  None     Rx / DC Orders   ED Discharge Orders     None        Note:  This document was prepared using Dragon voice recognition software and may include unintentional dictation errors.   Delton Prairie, MD 11/19/21 [phone]

## 2021-11-19 NOTE — ED Notes (Signed)
Report to [employer], med tech at [facility], no nurse available at night.

## 2022-01-04 ENCOUNTER — Non-Acute Institutional Stay: Payer: Medicare Other | Admitting: Student

## 2022-01-04 DIAGNOSIS — E43 Unspecified severe protein-calorie malnutrition: Secondary | ICD-10-CM

## 2022-01-04 DIAGNOSIS — R634 Abnormal weight loss: Secondary | ICD-10-CM

## 2022-01-04 DIAGNOSIS — F03B3 Unspecified dementia, moderate, with mood disturbance: Secondary | ICD-10-CM

## 2022-01-04 DIAGNOSIS — Z515 Encounter for palliative care: Secondary | ICD-10-CM

## 2022-01-04 NOTE — Progress Notes (Signed)
[facility] Consult Note Telephone: [phone]  Fax: [phone]   Date of encounter: 01/04/22 4:57 PM PATIENT NAME: Kerry Cabrera [address] [place] [address]   [phone] (home)  DOB: [birth year] MRN: [id number] PRIMARY CARE PROVIDER:    [facility],  [place] [address] [phone]  REFERRING PROVIDER:   [facility] [address],  [place] [address] [phone]  RESPONSIBLE PARTY:    Contact Information     Name Relation Home Work [place] Daughter [phone]  [phone]   Gilani,Scott Son   [phone]   Johann Capers   [phone]        I met face to face with patient in the facility. Palliative Care was asked to follow this patient by consultation request of  [facility] to address advance care planning and complex medical decision making. This is the initial visit.                                     ASSESSMENT AND PLAN / RECOMMENDATIONS:   Advance Care Planning/Goals of Care: Goals include to maximize quality of life and symptom management. Patient/health care surrogate gave his/her permission to discuss.Our advance care planning conversation included a discussion about:    The value and importance of advance care planning  Experiences with loved ones who have been seriously ill or have died  Exploration of personal, cultural or spiritual beliefs that might influence medical decisions  Exploration of goals of care in the event of a sudden injury or illness  Review and updating or creation of an advance directive document  Decision not to resuscitate or to de-escalate disease focused treatments due to poor prognosis. CODE STATUS: DNR  Education provided on Palliative Medicine vs. Hospice services. Discussed with daughter via telephone. Patient with weight loss; recommend  mirtazapine and increasing nutritional supplement. Will monitor for further changes/declines.  We did discuss referring to hospice should patient continue to lose weight.   Symptom Management/Plan:  Dementia with behavioral disturbances- patient resides at [facility] on dementia unit. Staff to continue assisting with adl's. Reorient and redirect as needed. Monitor for falls/safety. Patient with labile mood. Her Depakote had been decreased; PCP recently increased back to previous dosage and frequency. Staff report some improvement with increase in dosage. Will monitor for functional and cognitive changes/declines.  Protein calorie malnutrition, weight loss-patient with > 10% weight loss in the past 6 months. She was 128 06/2021, down to 106.4 pounds this month. Recommend mirtazapine 7.5 mg QHS, increase nutritional supplement to TID. Continue weighing every 2 weeks.   Follow up Palliative Care Visit: Palliative care will continue to follow for complex medical decision making, advance care planning, and clarification of goals. Return in 4 weeks or prn.   This visit was coded based on medical decision making (MDM).  PPS: 40%  HOSPICE ELIGIBILITY/DIAGNOSIS: TBD  Chief Complaint: Palliative Medicine initial consult.   HISTORY OF PRESENT ILLNESS:  Kerry Cabrera is a 88 y.o. year old female  with dementia w/behavioral disturbances, anxiety, depression, hypertension, GERD, OA.  Patient resides at [facility] on locked memory unit. Patient requires assistance with all adl's. Patient has labile mood per staff; she has periods of crying. She paces about unit. Staff report she does not sit at dining room table  for short periods, only eating bites. She also eats snacks provided by daughter. Daughter states patient will eat good when she takes her out.   Patient received resting in her room. She responds to questions. She does have notable forgetfulness. She states she is 88  years old. She denies pain, shortness of breath, nausea, constipation. She is cooperative.   History obtained from review of EMR, discussion with primary team, and interview with family, facility staff/caregiver and/or Ms. Arreguin.  I reviewed available labs, medications, imaging, studies and related documents from the EMR.  Records reviewed and summarized above.   ROS  Patient with dementia; staff contributes.   Physical Exam: Weight-106.4 pounds Constitutional: NAD General: frail appearing, thin  EYES: anicteric sclera, lids intact, no discharge  ENMT: intact hearing, oral mucous membranes moist, dentition intact CV: S1S2, RRR, no LE edema Pulmonary: LCTA, no increased work of breathing, no cough, room air Abdomen: normo-active BS + 4 quadrants, soft and non tender, no ascites GU: deferred MSK: + sarcopenia, moves all extremities, ambulatory Skin: warm and dry, no rashes or wounds on visible skin Neuro: + generalized weakness,  A & O to person, place Psych: non-anxious affect Hem/lymph/immuno: no widespread bruising CURRENT PROBLEM LIST:  Patient Active Problem List   Diagnosis Date Noted   Dehydration    Syncope    Elevated troponin    Hypotension 09/27/2020   Syncope, vasovagal 09/27/2020   Hypocalcemia 09/27/2020   Lactic acidosis 09/27/2020   Dementia without behavioral disturbance ([place]) 09/27/2020   Acute encephalopathy 10/07/2019   Closed left hip fracture ([place]) 06/21/2019   Acute respiratory failure with hypoxia (HCC) 06/21/2019   GERD (gastroesophageal reflux disease) 06/21/2019   Depression with anxiety 06/21/2019   Fall 06/21/2019   Symptomatic bradycardia 11/05/2017   Abnormal gait 12/17/2016   Avascular necrosis of femoral head ([place]) 12/17/2016   Bursitis of hip 12/17/2016   CAD (coronary artery disease) 12/17/2016   Closed fracture of intracapsular section of femur ([place]) 12/17/2016   Closed transcervical fracture of femur ([place]) 12/17/2016   Hip pain  12/17/2016   Inflammation of sacroiliac joint ([place]) 12/17/2016   Osteoarthritis of hip 12/17/2016   Osteoarthritis of knee 12/17/2016   Post-traumatic osteoarthritis 12/17/2016   Diarrhea 09/20/2016   Hiatal hernia 09/12/2016   Acquired hypothyroidism 07/04/2016   S/P total knee arthroplasty 08/08/2015   Moderate mitral insufficiency 12/20/2014   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   Hyperlipidemia, mixed 07/09/2013   PAST MEDICAL HISTORY:  Active Ambulatory Problems    Diagnosis Date Noted   S/P total knee arthroplasty 08/08/2015   Abnormal gait 12/17/2016   Acquired hypothyroidism 07/04/2016   Avascular necrosis of femoral head ([place]) 12/17/2016   Bursitis of hip 12/17/2016   CAD (coronary artery disease) 12/17/2016   Closed fracture of intracapsular section of femur ([place]) 12/17/2016   Closed transcervical fracture of femur ([place]) 12/17/2016   Diarrhea 09/20/2016   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   Hiatal hernia 09/12/2016   Hip pain 12/17/2016   Hyperlipidemia, mixed 07/09/2013   Inflammation of sacroiliac joint ([place]) 12/17/2016   Moderate mitral insufficiency 12/20/2014   Osteoarthritis of hip 12/17/2016   Osteoarthritis of knee 12/17/2016   Post-traumatic osteoarthritis 12/17/2016   Symptomatic bradycardia 11/05/2017   Closed left hip fracture ([place]) 06/21/2019   Acute respiratory failure with hypoxia (HCC) 06/21/2019   GERD (gastroesophageal reflux disease) 06/21/2019   Depression with anxiety 06/21/2019   Fall 06/21/2019   Acute encephalopathy 10/07/2019   Hypotension 09/27/2020   Syncope, vasovagal 09/27/2020  Hypocalcemia 09/27/2020   Lactic acidosis 09/27/2020   Dementia without behavioral disturbance ([place]) 09/27/2020   Dehydration    Syncope    Elevated troponin    Resolved Ambulatory Problems    Diagnosis Date Noted   No Resolved Ambulatory Problems   Past Medical History:  Diagnosis Date   Arthritis    Carotid artery occlusion    Complication of anesthesia     Coronary artery disease    Depression    Glaucoma    Hyperlipemia    Hypertension    Hypothyroidism    Insomnia    PUD (peptic ulcer disease)    TIA (transient ischemic attack)    SOCIAL HX:  Social History   Tobacco Use   Smoking status: Former    Years: 12.00    Types: Cigarettes   Smokeless tobacco: Never  Substance Use Topics   Alcohol use: Yes    Comment: osccasional   FAMILY HX:  Family History  Problem Relation Age of Onset   Prostate cancer Son    Kidney disease Neg Hx    Kidney cancer Neg Hx       ALLERGIES:  Allergies  Allergen Reactions   Ace Inhibitors Cough   Codeine Nausea And Vomiting and Nausea Only   Gabapentin Other (See Comments)    unsure   Micardis Hct [Telmisartan-Hctz]     unsure   Prednisone Other (See Comments)    Pt has received cortisone injection for knee with out problems. Pt states she will not take pills for extended time due to feeling lethargic and weight gain. Weight gain   Sulfa Antibiotics Other (See Comments) and Rash    Fever 106 degrees,  sent to ER High fever   Zithromax [Azithromycin] Other (See Comments) and Rash    unsure     PERTINENT MEDICATIONS:  Outpatient Encounter Medications as of 01/04/2022  Medication Sig   acetaminophen (TYLENOL) 500 MG tablet Take 1,000 mg by mouth 2 (two) times daily.   amLODipine (NORVASC) 5 MG tablet Take 0.5 tablets (2.5 mg total) by mouth daily. (Patient not taking: Reported on 03/24/2021)   Cobalamin Combinations (B12 FOLATE) 800-800 MCG CAPS Take 1 capsule by mouth daily at 12 noon.   divalproex (DEPAKOTE) 250 MG DR tablet Take 250 mg by mouth 3 (three) times daily.   donepezil (ARICEPT) 10 MG tablet Take 10 mg by mouth at bedtime.   ferrous sulfate 325 (65 FE) MG tablet Take 325 mg by mouth daily at 12 noon.   gabapentin (NEURONTIN) 100 MG capsule Take 100 mg by mouth at bedtime.   levothyroxine (SYNTHROID) 137 MCG tablet Take 137 mcg by mouth daily before breakfast. (Patient not  taking: Reported on 03/24/2021)   levothyroxine (SYNTHROID) 150 MCG tablet Take 150 mcg by mouth daily.   lidocaine (LIDODERM) 5 % Place 1 patch onto the skin every 12 (twelve) hours. Remove & Discard patch within 12 hours or as directed by MD   melatonin 3 MG TABS tablet Take 6 mg by mouth at bedtime.   omeprazole (PRILOSEC) 40 MG capsule Take 40 mg by mouth 2 (two) times daily.   sucralfate (CARAFATE) 1 g tablet Take 1 g by mouth 2 (two) times daily.   traZODone (DESYREL) 100 MG tablet Take 100 mg by mouth at bedtime.   No facility-administered encounter medications on file as of 01/04/2022.   Thank you for the opportunity to participate in the care of Ms. Erbe.  The palliative care team will continue  to follow. Please call our office at [phone] if we can be of additional assistance.   Ezekiel Slocumb, NP   COVID-19 PATIENT SCREENING TOOL Asked and negative response unless otherwise noted:  Have you had symptoms of covid, tested positive or been in contact with someone with symptoms/positive test in the past 5-10 days? No

## 2022-01-05 ENCOUNTER — Other Ambulatory Visit: Payer: Self-pay

## 2022-01-05 ENCOUNTER — Emergency Department
Admission: EM | Admit: 2022-01-05 | Discharge: 2022-01-05 | Disposition: A | Discharge status: Home / Self Care | Payer: Medicare Other | Attending: Emergency Medicine | Admitting: Emergency Medicine

## 2022-01-05 DIAGNOSIS — F0390 Unspecified dementia without behavioral disturbance: Secondary | ICD-10-CM | POA: Insufficient documentation

## 2022-01-05 DIAGNOSIS — R45851 Suicidal ideations: Secondary | ICD-10-CM | POA: Diagnosis not present

## 2022-01-05 LAB — URINALYSIS, ROUTINE W REFLEX MICROSCOPIC
Bilirubin Urine: NEGATIVE
Glucose, UA: NEGATIVE mg/dL
Hgb urine dipstick: NEGATIVE
Ketones, ur: NEGATIVE mg/dL
Nitrite: NEGATIVE
Protein, ur: 30 mg/dL — AB
Specific Gravity, Urine: 1.03 (ref 1.005–1.030)
pH: 5 (ref 5.0–8.0)

## 2022-01-05 MED ORDER — FOSFOMYCIN TROMETHAMINE 3 G PO PACK
3.0000 g | PACK | Freq: Once | ORAL | Status: AC
  Administered 2022-01-05: 3 g via ORAL
  Filled 2022-01-05: qty 3

## 2022-01-05 NOTE — Discharge Instructions (Signed)
Please seek medical attention for any high fevers, chest pain, shortness of breath, change in behavior, persistent vomiting, bloody stool or any other new or concerning symptoms.  

## 2022-01-05 NOTE — ED Notes (Signed)
Anderson Malta given report at [facility].

## 2022-01-05 NOTE — ED Notes (Signed)
Called lab to add on UA. Olen Cordial states will do so now.

## 2022-01-05 NOTE — ED Notes (Signed)
[facility]  FOR TRANSPORT  TO  HOMEPLACE  OF  Kerry Cabrera

## 2022-01-05 NOTE — ED Notes (Signed)
Lav, lt grn and red tubes sent to lab.

## 2022-01-05 NOTE — ED Notes (Signed)
This RN walked hallways with pt; pt used walker though stated usually doesn't use walker or cane; pt steady. Pt back in recliner.

## 2022-01-05 NOTE — ED Notes (Signed)
EDP Goodman at bedside.  

## 2022-01-05 NOTE — ED Notes (Signed)
Pt walked hallway with NT using walker as stated her legs were getting stiff and needed to move them. Pt back to bed resting now.

## 2022-01-05 NOTE — ED Triage Notes (Addendum)
Pt in via EMS from home place of [facility]; EMS states called out as pt stated she was going to "kill" herself; pt history of dementia per EMS; pt refused vitals with EMS; pt currently refusing vital signs and blood-work with this RN. Pt denies SI and HI; pt states "I came home and I think I pissed someone off so they sent me here". Pt given warm blanket.

## 2022-01-05 NOTE — ED Notes (Addendum)
Pt declined offer of food tray. Pt stated "the way I feel right now I couldn't eat". States is anxious to leave because she "has company coming". Secretary setting up EMS transport back to facility.

## 2022-01-05 NOTE — ED Notes (Signed)
EDP Archie Balboa and Network engineer state pt's daughter unable to take her back to facility and requesting EMS transport.

## 2022-01-05 NOTE — ED Notes (Signed)
Clean catch urine sample collected in hat at 15:28 and sent to lab for analysis.

## 2022-01-05 NOTE — ED Notes (Signed)
Pt gave verbal okay for this RN to collect blood in hopes to "speed up process to get back to my family" per pt.

## 2022-01-05 NOTE — ED Notes (Signed)
Pt confirmed she is allergic to codeine and gabapentin but unsure of others. Pt up to restroom using walker with NT assisting. Hat for toilet and urine sample cup sent with pt.

## 2022-01-05 NOTE — ED Notes (Signed)
Provider Archie Balboa notified via secure chat that facility requesting UA as concerned pt may have UTI since state she has been acting more irritable than normal last few days. Awaiting reply/orders.

## 2022-01-05 NOTE — ED Provider Notes (Signed)
   [facility] Provider Note    Event Date/Time   First MD Initiated Contact with Patient 01/05/22 1720     (approximate)   History   Psychiatric Evaluation   HPI  Kerry Cabrera is a 88 y.o. female  who presents to the emergency department today because of concern for suicidal ideation. The patient does have dementia and does not remember voicing any suicidal ideation. She does state that she might have said something that made people upset. She denies any thoughts of self harm at the time of my exam.        Physical Exam   Triage Vital Signs: ED Triage Vitals  Enc Vitals Group     BP --      Pulse --      Resp --      Temp --      Temp src --      SpO2 --      Weight 01/05/22 1716 112 lb (50.8 kg)     Height 01/05/22 1716 5\' 5"  (1.651 m)     Head Circumference --      Peak Flow --      Pain Score 01/05/22 1713 0     Pain Loc --      Pain Edu? --      Excl. in [place]? --     Most recent vital signs: Vitals:   01/05/22 1745  BP: (!) 127/108  Pulse: 95  Resp: 18  Temp: 98.6 F (37 C)  SpO2: 100%     General: Awake, alert, not completely oriented. CV:  Good peripheral perfusion.  Resp:  Normal effort.  Abd:  No distention.     ED Results / Procedures / Treatments   Labs (all labs ordered are listed, but only abnormal results are displayed) Labs Reviewed - No data to display   EKG  None   RADIOLOGY None   PROCEDURES:  Critical Care performed: No  Procedures   MEDICATIONS ORDERED IN ED: Medications - No data to display   IMPRESSION / MDM / [place] / ED COURSE  I reviewed the triage vital signs and the nursing notes.                              Differential diagnosis includes, but is not limited to, dementia, si.  Patient's presentation is most consistent with acute presentation with potential threat to life or bodily function.  Patient presents from facility because of concern for SI. On my  exam patient does not remember expressing SI and denies any current SI. I discussed with daughter, Collier Bullock, over the telephone who stated she was comfortable with the patient being discharged back to the facility. Apparently facility also had concern for possible UTI. UA did show some indications of possible UTI. Will order a fosfomycin.   FINAL CLINICAL IMPRESSION(S) / ED DIAGNOSES   Final diagnoses:  Dementia, unspecified dementia severity, unspecified dementia type, unspecified whether behavioral, psychotic, or mood disturbance or anxiety ([place])       Note:  This document was prepared using Dragon voice recognition software and may include unintentional dictation errors.    Nance Pear, MD 01/05/22 [phone]

## 2022-01-05 NOTE — ED Notes (Signed)
Report given to Julia, RN

## 2022-01-08 ENCOUNTER — Telehealth: Payer: Self-pay | Admitting: Student

## 2022-01-08 NOTE — Telephone Encounter (Signed)
Palliative NP returned call to patient's daughter. Patient was sent to ED on Friday for psych evaluation. She is in agreement with proceeding with hospice evaluation, patient being managed at facility. PCP notified regarding order for hospice evaluation.

## 2022-05-14 ENCOUNTER — Inpatient Hospital Stay

## 2022-05-14 ENCOUNTER — Encounter: Payer: Self-pay | Admitting: Internal Medicine

## 2022-05-14 ENCOUNTER — Inpatient Hospital Stay
Admission: EM | Admit: 2022-05-14 | Discharge: 2022-05-15 | DRG: 536 | Disposition: A | Discharge status: Hospice | Source: Skilled Nursing Facility | Attending: Internal Medicine | Admitting: Internal Medicine

## 2022-05-14 ENCOUNTER — Emergency Department

## 2022-05-14 ENCOUNTER — Other Ambulatory Visit: Payer: Self-pay

## 2022-05-14 DIAGNOSIS — Z515 Encounter for palliative care: Secondary | ICD-10-CM | POA: Diagnosis not present

## 2022-05-14 DIAGNOSIS — Z79890 Hormone replacement therapy (postmenopausal): Secondary | ICD-10-CM | POA: Diagnosis not present

## 2022-05-14 DIAGNOSIS — I959 Hypotension, unspecified: Secondary | ICD-10-CM | POA: Diagnosis present

## 2022-05-14 DIAGNOSIS — G4700 Insomnia, unspecified: Secondary | ICD-10-CM | POA: Diagnosis present

## 2022-05-14 DIAGNOSIS — F0390 Unspecified dementia without behavioral disturbance: Secondary | ICD-10-CM | POA: Diagnosis present

## 2022-05-14 DIAGNOSIS — Z8673 Personal history of transient ischemic attack (TIA), and cerebral infarction without residual deficits: Secondary | ICD-10-CM | POA: Diagnosis not present

## 2022-05-14 DIAGNOSIS — Z881 Allergy status to other antibiotic agents status: Secondary | ICD-10-CM

## 2022-05-14 DIAGNOSIS — K219 Gastro-esophageal reflux disease without esophagitis: Secondary | ICD-10-CM | POA: Diagnosis present

## 2022-05-14 DIAGNOSIS — S7292XA Unspecified fracture of left femur, initial encounter for closed fracture: Secondary | ICD-10-CM | POA: Diagnosis present

## 2022-05-14 DIAGNOSIS — F0394 Unspecified dementia, unspecified severity, with anxiety: Secondary | ICD-10-CM | POA: Diagnosis present

## 2022-05-14 DIAGNOSIS — E782 Mixed hyperlipidemia: Secondary | ICD-10-CM | POA: Diagnosis present

## 2022-05-14 DIAGNOSIS — S42202A Unspecified fracture of upper end of left humerus, initial encounter for closed fracture: Secondary | ICD-10-CM

## 2022-05-14 DIAGNOSIS — Z79899 Other long term (current) drug therapy: Secondary | ICD-10-CM

## 2022-05-14 DIAGNOSIS — Z885 Allergy status to narcotic agent status: Secondary | ICD-10-CM

## 2022-05-14 DIAGNOSIS — E876 Hypokalemia: Secondary | ICD-10-CM | POA: Insufficient documentation

## 2022-05-14 DIAGNOSIS — I10 Essential (primary) hypertension: Secondary | ICD-10-CM | POA: Diagnosis present

## 2022-05-14 DIAGNOSIS — S42292A Other displaced fracture of upper end of left humerus, initial encounter for closed fracture: Secondary | ICD-10-CM | POA: Diagnosis present

## 2022-05-14 DIAGNOSIS — Z681 Body mass index (BMI) 19 or less, adult: Secondary | ICD-10-CM | POA: Diagnosis not present

## 2022-05-14 DIAGNOSIS — F0393 Unspecified dementia, unspecified severity, with mood disturbance: Secondary | ICD-10-CM | POA: Diagnosis present

## 2022-05-14 DIAGNOSIS — S42302A Unspecified fracture of shaft of humerus, left arm, initial encounter for closed fracture: Secondary | ICD-10-CM | POA: Insufficient documentation

## 2022-05-14 DIAGNOSIS — M19012 Primary osteoarthritis, left shoulder: Secondary | ICD-10-CM | POA: Diagnosis present

## 2022-05-14 DIAGNOSIS — Z87891 Personal history of nicotine dependence: Secondary | ICD-10-CM | POA: Diagnosis not present

## 2022-05-14 DIAGNOSIS — I2510 Atherosclerotic heart disease of native coronary artery without angina pectoris: Secondary | ICD-10-CM | POA: Diagnosis present

## 2022-05-14 DIAGNOSIS — R64 Cachexia: Secondary | ICD-10-CM | POA: Diagnosis present

## 2022-05-14 DIAGNOSIS — S72302A Unspecified fracture of shaft of left femur, initial encounter for closed fracture: Secondary | ICD-10-CM

## 2022-05-14 DIAGNOSIS — Z66 Do not resuscitate: Secondary | ICD-10-CM | POA: Diagnosis present

## 2022-05-14 DIAGNOSIS — S7222XA Displaced subtrochanteric fracture of left femur, initial encounter for closed fracture: Principal | ICD-10-CM | POA: Diagnosis present

## 2022-05-14 DIAGNOSIS — F418 Other specified anxiety disorders: Secondary | ICD-10-CM | POA: Diagnosis present

## 2022-05-14 DIAGNOSIS — Y92098 Other place in other non-institutional residence as the place of occurrence of the external cause: Secondary | ICD-10-CM | POA: Diagnosis not present

## 2022-05-14 DIAGNOSIS — Z888 Allergy status to other drugs, medicaments and biological substances status: Secondary | ICD-10-CM

## 2022-05-14 DIAGNOSIS — R54 Age-related physical debility: Secondary | ICD-10-CM | POA: Diagnosis present

## 2022-05-14 DIAGNOSIS — W050XXA Fall from non-moving wheelchair, initial encounter: Secondary | ICD-10-CM | POA: Diagnosis present

## 2022-05-14 DIAGNOSIS — S42212A Unspecified displaced fracture of surgical neck of left humerus, initial encounter for closed fracture: Secondary | ICD-10-CM | POA: Diagnosis present

## 2022-05-14 DIAGNOSIS — Z96641 Presence of right artificial hip joint: Secondary | ICD-10-CM | POA: Diagnosis present

## 2022-05-14 DIAGNOSIS — D649 Anemia, unspecified: Secondary | ICD-10-CM | POA: Diagnosis present

## 2022-05-14 DIAGNOSIS — R0600 Dyspnea, unspecified: Secondary | ICD-10-CM

## 2022-05-14 DIAGNOSIS — Z96652 Presence of left artificial knee joint: Secondary | ICD-10-CM | POA: Diagnosis present

## 2022-05-14 DIAGNOSIS — E039 Hypothyroidism, unspecified: Secondary | ICD-10-CM | POA: Diagnosis present

## 2022-05-14 DIAGNOSIS — Z882 Allergy status to sulfonamides status: Secondary | ICD-10-CM

## 2022-05-14 DIAGNOSIS — W19XXXA Unspecified fall, initial encounter: Principal | ICD-10-CM

## 2022-05-14 DIAGNOSIS — S72002A Fracture of unspecified part of neck of left femur, initial encounter for closed fracture: Secondary | ICD-10-CM

## 2022-05-14 LAB — CBC WITH DIFFERENTIAL/PLATELET
Abs Immature Granulocytes: 0.01 10*3/uL (ref 0.00–0.07)
Basophils Absolute: 0 10*3/uL (ref 0.0–0.1)
Basophils Relative: 1 %
Eosinophils Absolute: 0 10*3/uL (ref 0.0–0.5)
Eosinophils Relative: 1 %
HCT: 34.7 % — ABNORMAL LOW (ref 36.0–46.0)
Hemoglobin: 11.3 g/dL — ABNORMAL LOW (ref 12.0–15.0)
Immature Granulocytes: 0 %
Lymphocytes Relative: 18 %
Lymphs Abs: 0.7 10*3/uL (ref 0.7–4.0)
MCH: 30.4 pg (ref 26.0–34.0)
MCHC: 32.6 g/dL (ref 30.0–36.0)
MCV: 93.3 fL (ref 80.0–100.0)
Monocytes Absolute: 0.3 10*3/uL (ref 0.1–1.0)
Monocytes Relative: 8 %
Neutro Abs: 2.9 10*3/uL (ref 1.7–7.7)
Neutrophils Relative %: 72 %
Platelets: 167 10*3/uL (ref 150–400)
RBC: 3.72 MIL/uL — ABNORMAL LOW (ref 3.87–5.11)
RDW: 15.1 % (ref 11.5–15.5)
WBC: 3.9 10*3/uL — ABNORMAL LOW (ref 4.0–10.5)
nRBC: 0 % (ref 0.0–0.2)

## 2022-05-14 LAB — BASIC METABOLIC PANEL
Anion gap: 7 (ref 5–15)
BUN: 12 mg/dL (ref 8–23)
CO2: 25 mmol/L (ref 22–32)
Calcium: 7.5 mg/dL — ABNORMAL LOW (ref 8.9–10.3)
Chloride: 107 mmol/L (ref 98–111)
Creatinine, Ser: 0.7 mg/dL (ref 0.44–1.00)
GFR, Estimated: >60 mL/min (ref >60)
Glucose, Bld: 144 mg/dL — ABNORMAL HIGH (ref 70–99)
Potassium: 3.1 mmol/L — ABNORMAL LOW (ref 3.5–5.1)
Sodium: 139 mmol/L (ref 135–145)

## 2022-05-14 LAB — PROCALCITONIN: Procalcitonin: <0.1 ng/mL

## 2022-05-14 LAB — GLUCOSE, CAPILLARY: Glucose-Capillary: 152 mg/dL — ABNORMAL HIGH (ref 70–99)

## 2022-05-14 LAB — MRSA NEXT GEN BY PCR, NASAL: MRSA by PCR Next Gen: NOT DETECTED

## 2022-05-14 MED ORDER — POTASSIUM CHLORIDE 10 MEQ/100ML IV SOLN
10.0000 meq | INTRAVENOUS | Status: AC
  Administered 2022-05-14 (×4): 10 meq via INTRAVENOUS
  Filled 2022-05-14 (×4): qty 100

## 2022-05-14 MED ORDER — SERTRALINE HCL 50 MG PO TABS
50.0000 mg | ORAL_TABLET | Freq: Every day | ORAL | Status: DC
  Filled 2022-05-14: qty 1

## 2022-05-14 MED ORDER — SENNOSIDES-DOCUSATE SODIUM 8.6-50 MG PO TABS: 1.0000 | ORAL_TABLET | Freq: Every evening | ORAL | Status: DC | PRN

## 2022-05-14 MED ORDER — MORPHINE SULFATE (PF) 2 MG/ML IV SOLN
1.0000 mg | INTRAVENOUS | Status: DC | PRN
  Filled 2022-05-14: qty 1

## 2022-05-14 MED ORDER — POTASSIUM CHLORIDE CRYS ER 20 MEQ PO TBCR: 40.0000 meq | EXTENDED_RELEASE_TABLET | Freq: Once | ORAL | Status: DC

## 2022-05-14 MED ORDER — ACETAMINOPHEN 650 MG RE SUPP: 650.0000 mg | Freq: Four times a day (QID) | RECTAL | Status: DC | PRN

## 2022-05-14 MED ORDER — LEVOTHYROXINE SODIUM 50 MCG PO TABS: 125.0000 ug | ORAL_TABLET | Freq: Every day | ORAL | Status: DC

## 2022-05-14 MED ORDER — POTASSIUM CITRATE-CITRIC ACID 1100-334 MG/5ML PO SOLN
40.0000 meq | Freq: Once | ORAL | Status: DC
  Filled 2022-05-14: qty 20

## 2022-05-14 MED ORDER — SODIUM CHLORIDE 0.9 % IV BOLUS
500.0000 mL | Freq: Once | INTRAVENOUS | Status: AC
  Administered 2022-05-14: 500 mL via INTRAVENOUS

## 2022-05-14 MED ORDER — DIVALPROEX SODIUM 250 MG PO DR TAB
250.0000 mg | DELAYED_RELEASE_TABLET | Freq: Two times a day (BID) | ORAL | Status: DC
  Administered 2022-05-14: 250 mg via ORAL
  Filled 2022-05-14 (×2): qty 1

## 2022-05-14 MED ORDER — HEPARIN SODIUM (PORCINE) 5000 UNIT/ML IJ SOLN
5000.0000 [IU] | Freq: Three times a day (TID) | INTRAMUSCULAR | Status: AC
  Administered 2022-05-14 – 2022-05-15 (×2): 5000 [IU] via SUBCUTANEOUS
  Filled 2022-05-14: qty 1

## 2022-05-14 MED ORDER — MORPHINE SULFATE (PF) 4 MG/ML IV SOLN
4.0000 mg | Freq: Once | INTRAVENOUS | Status: AC
  Administered 2022-05-14: 4 mg via INTRAVENOUS
  Filled 2022-05-14: qty 1

## 2022-05-14 MED ORDER — FENTANYL CITRATE PF 50 MCG/ML IJ SOSY: 12.5000 ug | PREFILLED_SYRINGE | INTRAMUSCULAR | Status: DC | PRN

## 2022-05-14 MED ORDER — TRAZODONE HCL 100 MG PO TABS
100.0000 mg | ORAL_TABLET | Freq: Every day | ORAL | Status: DC
  Administered 2022-05-14: 100 mg via ORAL
  Filled 2022-05-14: qty 1

## 2022-05-14 MED ORDER — MELATONIN 5 MG PO TABS
5.0000 mg | ORAL_TABLET | Freq: Every day | ORAL | Status: DC
  Administered 2022-05-14: 5 mg via ORAL
  Filled 2022-05-14: qty 1

## 2022-05-14 MED ORDER — HYDROCODONE-ACETAMINOPHEN 5-325 MG PO TABS: 1.0000 | ORAL_TABLET | Freq: Four times a day (QID) | ORAL | Status: DC | PRN

## 2022-05-14 MED ORDER — FERROUS SULFATE 325 (65 FE) MG PO TABS: 325.0000 mg | ORAL_TABLET | Freq: Every day | ORAL | Status: DC

## 2022-05-14 MED ORDER — LAMOTRIGINE 25 MG PO TABS
50.0000 mg | ORAL_TABLET | Freq: Two times a day (BID) | ORAL | Status: DC
  Administered 2022-05-14: 50 mg via ORAL
  Filled 2022-05-14 (×2): qty 2

## 2022-05-14 MED ORDER — HEPARIN SODIUM (PORCINE) 5000 UNIT/ML IJ SOLN: 5000.0000 [IU] | Freq: Three times a day (TID) | INTRAMUSCULAR | Status: DC

## 2022-05-14 MED ORDER — POTASSIUM CITRATE-CITRIC ACID 1100-334 MG/5ML PO SOLN
40.0000 meq | Freq: Once | ORAL | Status: DC
  Filled 2022-05-14 (×2): qty 20

## 2022-05-14 MED ORDER — PANTOPRAZOLE SODIUM 40 MG IV SOLR: 40.0000 mg | Freq: Every day | INTRAVENOUS | Status: DC | PRN

## 2022-05-14 MED ORDER — MORPHINE SULFATE (PF) 2 MG/ML IV SOLN
1.0000 mg | INTRAVENOUS | Status: AC | PRN
  Administered 2022-05-14 – 2022-05-15 (×4): 1 mg via INTRAVENOUS
  Filled 2022-05-14 (×3): qty 1

## 2022-05-14 MED ORDER — MUSCLE RUB 10-15 % EX CREA
1.0000 | TOPICAL_CREAM | Freq: Every day | CUTANEOUS | Status: DC
  Administered 2022-05-14: 1 via TOPICAL
  Filled 2022-05-14 (×2): qty 85

## 2022-05-14 MED ORDER — FENTANYL CITRATE PF 50 MCG/ML IJ SOSY
12.5000 ug | PREFILLED_SYRINGE | INTRAMUSCULAR | Status: DC | PRN
  Filled 2022-05-14: qty 1

## 2022-05-14 MED ORDER — ACETAMINOPHEN 325 MG PO TABS: 650.0000 mg | ORAL_TABLET | Freq: Four times a day (QID) | ORAL | Status: DC | PRN

## 2022-05-14 MED ORDER — MIRTAZAPINE 15 MG PO TABS
7.5000 mg | ORAL_TABLET | Freq: Every day | ORAL | Status: DC
  Administered 2022-05-14: 7.5 mg via ORAL
  Filled 2022-05-14: qty 1

## 2022-05-14 MED ORDER — SODIUM CHLORIDE 0.9 % IV SOLN: INTRAVENOUS | Status: AC

## 2022-05-14 NOTE — Assessment & Plan Note (Addendum)
Acute fracture of left humeral head and neck - Orthopedic service has been consulted to Dr. Harlow Mares via [place] and via epic order by myself - Symptomatic support: morphine 1 mg IV every 4 hours as needed for severe pain, 4 doses ordered; fentanyl 12.5 mg IV every 2 hours as needed for severe pain, 4 doses ordered - AM team to evaluate patient at bedside to determine continued opioid pain medication requirements - Admit to MedSurg, inpatient

## 2022-05-14 NOTE — Progress Notes (Signed)
Responded to rapid response. On arrival, RT had been present, ICU CN, hospitalist. Reported that patient was difficult to  awaken and decreased bp. Pt responsive.

## 2022-05-14 NOTE — ED Notes (Signed)
Informed RN bed assigned 

## 2022-05-14 NOTE — Progress Notes (Addendum)
83 Pt refuses for nurses to place bucks traction. MD Dr. Harlow Mares aware. Unable to complete full skin assessment to sacrum pt refuses to turn, briefs on. Able to remove clothes and extra linen under. Gown placed. Bed alarm in place.

## 2022-05-14 NOTE — Assessment & Plan Note (Signed)
-   Sertraline 50 mg daily, mirtazapine 7.5 mg nightly, trazodone 100 mg nightly were resumed on admission

## 2022-05-14 NOTE — Progress Notes (Signed)
Rapid Response Event Note   Reason for Call : Hypotension   Initial Focused Assessment: On my arrival pt is drowsy but will arouse and answer questions. Pt's BP 58/38 on monitor. Primary RN states manual BP is 60/42. All other VS stable at this time.   Interventions: Dr. Tobie Poet paged. NS bolus started. CBG checked, 152.   Plan of Care: Dr. Tobie Poet arrived at bedside and wants to continue NS bolus. Pt is becoming more responsive. Repeat BP 102/65 with MAP of 78. Pt to remain on 1A at this time. Will follow up on pt. Primary RN notified to call with any other issues.    Event Summary:   MD Notified: Dr. Tobie Poet Call Time: 1848 Arrival Time: [place] End Time: [place], RN

## 2022-05-14 NOTE — Hospital Course (Addendum)
Ms. Kerry Cabrera is a 89 year old female with history of dementia, anxiety, recurrent major depression in partial remission, insomnia, history of fall with left hip fracture status post cannulated hip pinning in March 2021, who presents emergency department for chief concerns of a fall.  Initial vitals in the ED showed temperature of 97.6, respiration rate of 16, heart rate of 97, blood pressure 152/97, SpO2 of 99% on room air.  Serum sodium is 139, potassium 3.1, chloride 107, bicarb 25, BUN of 12, serum creatinine of 0.70, eGFR greater than 60, nonfasting blood glucose 144, WBC 3.9, hemoglobin 11.3, platelets of 167.  Imagings  Left shoulder x-ray: acute fracture of left humeral head and neck.  Advanced glenohumeral osteoarthritis.  Left femur: Acute fracture involving intertrochanteric and subtrochanteric aspect of the left femur with superolateral displacement of the femoral shaft component.  Pelvis 2 view: Acute fracture involving intertrochanteric and subtrochanteric aspect of the left femur with superior lateral displacement of the femoral shaft component.  ED treatment: Morphine 4 mg IV one-time dose.

## 2022-05-14 NOTE — ED Triage Notes (Signed)
Pt BIBA from [facility]. EMS reports pt found down by staff. Resident reports pt fell out of wheelchair. Pt has deformity to proximal femur pulses intact. Also complaining of left shoulder pain. Provider at bedside.

## 2022-05-14 NOTE — Significant Event (Signed)
Significant Event  Rapid Response was called at approximately 1849 for blood pressure of 60/42 and changed in mentation.  I presented to bedside immediately.  Patient was able to tell me her full name.  When I asked her, her age, she answered to me that her age was 63.  She asked the chaplain to call her daughter, Magda Paganini because there is a lot of people in the room and making her nervous.  Patient orientation appears to be improved from my evaluation of patient during the H&P catheter in process.  She endorses pain in her left upper and lower extremities with movement.  She endorses mild shortness of breath, denies cough.  Her blood pressure on the vitamin C and showed 94/50, with a MAP of 65, heart rate of 98.  Her respiration rate was steady, with normal respiratory effort and no accessory muscle use.  Nasal cannula was in place.  Hypotension-sodium chloride 500 mL bolus one-time dose, discussed with nursing staff - Post NS bolus we will continue with sodium chloride 100 mL/h, 10 hours ordered, discussed with nursing at bedside - With endorsement of new shortness of breath, check procalcitonin and portable chest x-ray  Hypokalemia - Per MAR, patient refused - We will continue with potassium chloride 10 mEq by IV every hour, 4 doses ordered  - Discussed with nursing  Repeat vitals: HR 94, RR 16, 102/65 (map 78), T 97.7, spO2 of 97% on 1 L [place]      Dr. Tobie Poet         CRITICAL CARE Performed by: Dr. Tobie Poet   Total critical care time: 30 minutes  Critical care time was exclusive of separately billable procedures and treating other patients.  Critical care was necessary to treat or prevent imminent or life-threatening deterioration.  Critical care was time spent personally by me on the following activities: development of treatment plan with patient and/or surrogate as well as nursing, discussions with consultants, evaluation of patient's response to treatment, examination of patient,  obtaining history from patient or surrogate, ordering and performing treatments and interventions, ordering and review of laboratory studies, ordering and review of radiographic studies, pulse oximetry and re-evaluation of patient's condition.

## 2022-05-14 NOTE — H&P (Addendum)
History and Physical   Kerry Cabrera DDU:[id number] DOB: 1928-08-20 DOA: 05/14/2022  PCP: Orvis Brill, [facility]  Outpatient Specialists: Dr. Alice Reichert, GI Patient coming from: [facility] via EMS  I have personally briefly reviewed patient's old medical records in [place].  Chief Concern: Fall  HPI: Ms. Kerry Cabrera is a 89 year old female with history of dementia, anxiety, recurrent major depression in partial remission, insomnia, history of fall with left hip fracture status post cannulated hip pinning in March 2021, who presents emergency department for chief concerns of a fall.  Initial vitals in the ED showed temperature of 97.6, respiration rate of 16, heart rate of 97, blood pressure 152/97, SpO2 of 99% on room air.  Serum sodium is 139, potassium 3.1, chloride 107, bicarb 25, BUN of 12, serum creatinine of 0.70, eGFR greater than 60, nonfasting blood glucose 144, WBC 3.9, hemoglobin 11.3, platelets of 167.  Imagings  Left shoulder x-ray: acute fracture of left humeral head and neck.  Advanced glenohumeral osteoarthritis.  Left femur: Acute fracture involving intertrochanteric and subtrochanteric aspect of the left femur with superolateral displacement of the femoral shaft component.  Pelvis 2 view: Acute fracture involving intertrochanteric and subtrochanteric aspect of the left femur with superior lateral displacement of the femoral shaft component.  ED treatment: Morphine 4 mg IV one-time dose. ------------------------ At bedside patient was able to tell me her full name.  She states that her current location is doctor's office.  She was able to recognize her daughter, and is able to call her daughter by name.  She was not able to tell me her age or the current calendar year, after multiple attempts.    She does not appear to be in acute distress.  However with ongoing physical exam and discussion, patient states that she is hurting and would like some  pain medication.  She endorses left-sided pain.  She was not able to clearly discuss what happened prior to her fall and after her fall.  At bedside when I ask if there is anything I can do for her, she states yes can you bring me a blood of liquor?  And she laughs and smiles.  Social history: Patient is currently at [facility].  She denies tobacco, EtOH, recreational drug use.  ROS: Unable to complete as patient appears to have moderate to advanced dementia  ED Course: Discussed with emergency provider, patient requiring hospitalization for chief concerns of left femur fracture and left humerus fracture.  Assessment/Plan  Principal Problem:   Femur fracture, left (HCC) Active Problems:   Acquired hypothyroidism   CAD (coronary artery disease)   Hyperlipidemia, mixed   GERD (gastroesophageal reflux disease)   Depression with anxiety   Fall   Dementia without behavioral disturbance (HCC)   Closed left humeral fracture   Hypokalemia   Assessment and Plan:  * Femur fracture, left ([place]) - Fall precautions - Orthopedic service has been consulted to Dr. Harlow Mares via EDP  Hypokalemia - Check magnesium level - Polycitra solution 40 mEq p.o. one-time dose ordered - EKG ordered, discussed with nursing via secure chat to complete and upload to epic  Closed left humeral fracture Acute fracture of left humeral head and neck - Orthopedic service has been consulted to Dr. Harlow Mares via EDP and via epic order by myself - Symptomatic support: morphine 1 mg IV every 4 hours as needed for severe pain, 4 doses ordered; fentanyl 12.5 mg IV every 2 hours as needed for severe  pain, 4 doses ordered - AM team to evaluate patient at bedside to determine continued opioid pain medication requirements - Admit to MedSurg, inpatient  Dementia without behavioral disturbance (HCC) - Depakote 250 mg p.o. twice daily, lamotrigine 50 mg p.o. twice daily resumed on admission  Depression with  anxiety - Sertraline 50 mg daily, mirtazapine 7.5 mg nightly, trazodone 100 mg nightly were resumed on admission  Acquired hypothyroidism - Levothyroxine 125 mcg daily resumed  Chart reviewed.   DVT prophylaxis: TED hose; heparin 5000 units subcutaneous at 2200, one-time dose ordered; AM team to reinitiate pharmacologic DVT prophylaxis when the benefits outweigh the risk Code Status: DNR, confirmed with daughter at bedside Diet: N.p.o. pending orthopedic evaluation and family determination if they would want surgery Family Communication: Updated daughter, Mickie Bail at bedside Disposition Plan: Pending clinical course Consults called: Orthopedic service Admission status: MedSurg, inpatient  Past Medical History:  Diagnosis Date   Arthritis    Carotid artery occlusion    Complication of anesthesia    very confused for days   Coronary artery disease    Depression    Glaucoma    Hyperlipemia    Hypertension    h/o not on medication   Hypothyroidism    Insomnia    PUD (peptic ulcer disease)    in distant past   TIA (transient ischemic attack)    Past Surgical History:  Procedure Laterality Date   CARDIAC CATHETERIZATION     HIP PINNING,CANNULATED Left 06/21/2019   Procedure: CANNULATED HIP PINNING;  Surgeon: Corky Mull, MD;  Location: [facility] ORS;  Service: Orthopedics;  Laterality: Left;   KNEE ARTHROPLASTY Left 08/08/2015   Procedure: COMPUTER ASSISTED TOTAL KNEE ARTHROPLASTY;  Surgeon: Dereck Leep, MD;  Location: [facility] ORS;  Service: Orthopedics;  Laterality: Left;   KNEE ARTHROSCOPY     Percutaneous pinning of right femoral neck fracture     TONSILLECTOMY     TOTAL HIP ARTHROPLASTY Right 02/06/2014   Dr. Earnestine Leys   Social History:  reports that she has quit smoking. Her smoking use included cigarettes. She has never used smokeless tobacco. She reports that she does not currently use alcohol. She reports that she does not use drugs.  Allergies  Allergen  Reactions   Ace Inhibitors Cough   Codeine Nausea And Vomiting and Nausea Only   Gabapentin Other (See Comments)    unsure   Micardis Hct [Telmisartan-Hctz]     unsure   Prednisone Other (See Comments)    Pt has received cortisone injection for knee with out problems. Pt states she will not take pills for extended time due to feeling lethargic and weight gain. Weight gain   Sulfa Antibiotics Other (See Comments) and Rash    Fever 106 degrees,  sent to ER High fever   Zithromax [Azithromycin] Other (See Comments) and Rash    unsure   Family History  Problem Relation Age of Onset   Prostate cancer Son    Kidney disease Neg Hx    Kidney cancer Neg Hx    Family history: Family history reviewed and not pertinent.  Prior to Admission medications   Medication Sig Start Date End Date Taking? Authorizing Provider  acetaminophen (TYLENOL) 325 MG tablet Take 650 mg by mouth 3 (three) times daily as needed.   Yes [provider]  divalproex (DEPAKOTE) 250 MG DR tablet Take 250 mg by mouth 2 (two) times daily.   Yes [provider]  ferrous sulfate 325 (65 FE) MG  tablet Take 325 mg by mouth daily at 12 noon.   Yes [provider]  LAMICTAL 25 MG tablet Take 50 mg by mouth 2 (two) times daily. 04/30/22  Yes [provider]  levothyroxine (SYNTHROID) 125 MCG tablet Take 125 mcg by mouth daily. 04/30/22  Yes [provider]  melatonin 3 MG TABS tablet Take 6 mg by mouth at bedtime.   Yes [provider]  Menthol, Topical Analgesic, (BIOFREEZE) 4 % GEL Apply 1 Application topically at bedtime. Apply to left hip   Yes [provider]  mirtazapine (REMERON) 7.5 MG tablet Take 7.5 mg by mouth at bedtime. 04/30/22  Yes [provider]  Multiple Vitamins-Minerals (MULTIVITAMIN ADULT, MINERALS, PO) Take 1 tablet by mouth daily. 11/16/21  Yes [provider]  omeprazole (PRILOSEC) 40 MG capsule Take 40 mg by mouth 2 (two) times  daily. 03/31/19  Yes [provider]  sertraline (ZOLOFT) 50 MG tablet Take 50 mg by mouth daily. 04/03/22  Yes [provider]  traZODone (DESYREL) 100 MG tablet Take 100 mg by mouth at bedtime. 06/08/19  Yes [provider]  acetaminophen (TYLENOL) 500 MG tablet Take 1,000 mg by mouth 2 (two) times daily. Patient not taking: Reported on 05/14/2022    [provider]  amLODipine (NORVASC) 5 MG tablet Take 0.5 tablets (2.5 mg total) by mouth daily. Patient not taking: Reported on 03/24/2021 10/01/20   Eugenie Filler, MD  Cobalamin Combinations (B12 FOLATE) 800-800 MCG CAPS Take 1 capsule by mouth daily at 12 noon. Patient not taking: Reported on 05/14/2022    [provider]  donepezil (ARICEPT) 10 MG tablet Take 10 mg by mouth at bedtime. Patient not taking: Reported on 05/14/2022 03/31/19   [provider]  gabapentin (NEURONTIN) 100 MG capsule Take 100 mg by mouth at bedtime. Patient not taking: Reported on 05/14/2022    [provider]  levothyroxine (SYNTHROID) 137 MCG tablet Take 137 mcg by mouth daily before breakfast. Patient not taking: Reported on 03/24/2021    [provider]  levothyroxine (SYNTHROID) 150 MCG tablet Take 150 mcg by mouth daily. Patient not taking: Reported on 05/14/2022 02/13/21   [provider]  lidocaine (LIDODERM) 5 % Place 1 patch onto the skin every 12 (twelve) hours. Remove & Discard patch within 12 hours or as directed by MD Patient not taking: Reported on 05/14/2022 11/19/21 11/19/22  Vladimir Crofts, MD  sucralfate (CARAFATE) 1 g tablet Take 1 g by mouth 2 (two) times daily. Patient not taking: Reported on 05/14/2022    [provider]   Physical Exam: Vitals:   05/14/22 1322 05/14/22 1325 05/14/22 1600  BP: (!) 152/97  104/73  Pulse: 97  (!) 108  Resp: 16  18  Temp: 97.6 F (36.4 C)  97.8 F (36.6 C)  TempSrc:   Oral  SpO2: 99%  95%  Weight:  51 kg   Height:  5\' 5"  (1.651  m)    Constitutional: appears frail, cachectic, NAD, calm, comfortable Eyes: PERRL, lids and conjunctivae normal ENMT: Mucous membranes are moist. Posterior pharynx clear of any exudate or lesions. Age-appropriate dentition. Hearing appropriate Neck: normal, supple, no masses, no thyromegaly Respiratory: clear to auscultation bilaterally, no wheezing, no crackles. Normal respiratory effort. No accessory muscle use.  Cardiovascular: Regular rate and rhythm, no murmurs / rubs / gallops. No extremity edema. 2+ pedal pulses. No carotid bruits.  Abdomen: Scaphoid abdomen, no tenderness, no masses palpated, no hepatosplenomegaly. Bowel sounds positive.  Musculoskeletal: no clubbing / cyanosis. No joint deformity upper and lower extremities.  Decreased ROM of the left upper and lower extremities, no contractures, no atrophy. Normal muscle tone.  Skin: no rashes, lesions, ulcers. No induration Neurologic: Sensation intact. Strength 5/5 in all 4.  Psychiatric: Normal judgment and insight. Alert and oriented x 3. Normal mood.   EKG: ordered  X-rays on Admission: I personally reviewed and I agree with radiologist reading as below.  CT Cervical Spine Wo Contrast  Result Date: 05/14/2022 CLINICAL DATA:  Neck trauma (Age >= 65y) Dementia patient post unwitnessed fall. EXAM: CT CERVICAL SPINE WITHOUT CONTRAST TECHNIQUE: Multidetector CT imaging of the cervical spine was performed without intravenous contrast. Multiplanar CT image reconstructions were also generated. RADIATION DOSE REDUCTION: This exam was performed according to the departmental dose-optimization program which includes automated exposure control, adjustment of the mA and/or kV according to patient size and/or use of iterative reconstruction technique. COMPARISON:  Cervical and thoracic spine CT 11/19/2021 FINDINGS: Alignment: No traumatic subluxation. Skull base and vertebrae: No acute fracture. Vertebral body heights are maintained. The dens and  skull base are intact. Stable bone island within anterior right aspect of C1 arch. Soft tissues and spinal canal: No prevertebral fluid or swelling. No visible canal hematoma. Chronic C1-C2 pannus. Disc levels: Stable degenerative disc disease with mild facet hypertrophy. Upper chest: Chronic T4 compression deformity is stable from prior exam. Small right pleural effusion is partially visualized. Other: Carotid calcifications. IMPRESSION: 1. No acute fracture or traumatic subluxation of the cervical spine. 2. Chronic T4 compression deformity. 3. Small right pleural effusion is partially visualized. Electronically Signed   By: Narda Rutherford M.D.   On: 05/14/2022 15:52   CT Head Wo Contrast  Result Date: 05/14/2022 CLINICAL DATA:  Head trauma, minor (Age >= 65y) Unwitnessed fall. EXAM: CT HEAD WITHOUT CONTRAST TECHNIQUE: Contiguous axial images were obtained from the base of the skull through the vertex without intravenous contrast. RADIATION DOSE REDUCTION: This exam was performed according to the departmental dose-optimization program which includes automated exposure control, adjustment of the mA and/or kV according to patient size and/or use of iterative reconstruction technique. COMPARISON:  Head CT 11/19/2021 FINDINGS: Brain: No acute intracranial hemorrhage. No subdural or extra-axial collection. Stable degree of atrophy and chronic small vessel ischemic change. Stable ventricular size without hydrocephalus. No evidence of acute ischemia, no midline shift or mass effect. Vascular: Atherosclerosis of skullbase vasculature without hyperdense vessel or abnormal calcification. Skull: No fracture or focal lesion. Sinuses/Orbits: Paranasal sinuses and mastoid air cells are clear. The visualized orbits are unremarkable. Bilateral lens resection. Other: No large scalp hematoma. IMPRESSION: 1. No acute intracranial abnormality. No skull fracture. 2. Stable atrophy and chronic small vessel ischemic change.  Electronically Signed   By: Narda Rutherford M.D.   On: 05/14/2022 15:46   DG Femur Min 2 Views Left  Result Date: 05/14/2022 CLINICAL DATA:  Fall, left hip pain EXAM: LEFT FEMUR 2 VIEWS; PELVIS - 1-2 VIEW COMPARISON:  06/21/2019 FINDINGS: Acute fracture involving the intertrochanteric and subtrochanteric aspect of the left femur with superolateral displacement of the femoral shaft component. Prior proximal left femoral ORIF with 3 partially threaded screws. Fracture involves the hardware in location. Hip joint alignment is maintained without dislocation. Prior left total knee arthroplasty without apparent complication. The imaged right total hip arthroplasty hardware appears intact. Bones are diffusely demineralized. No pelvic diastasis. IMPRESSION: Acute fracture involving the intertrochanteric and subtrochanteric aspect of the left femur with superolateral displacement of the femoral  shaft component. Electronically Signed   By: Davina Poke D.O.   On: 05/14/2022 14:27   DG Pelvis 1-2 Views  Result Date: 05/14/2022 CLINICAL DATA:  Fall, left hip pain EXAM: LEFT FEMUR 2 VIEWS; PELVIS - 1-2 VIEW COMPARISON:  06/21/2019 FINDINGS: Acute fracture involving the intertrochanteric and subtrochanteric aspect of the left femur with superolateral displacement of the femoral shaft component. Prior proximal left femoral ORIF with 3 partially threaded screws. Fracture involves the hardware in location. Hip joint alignment is maintained without dislocation. Prior left total knee arthroplasty without apparent complication. The imaged right total hip arthroplasty hardware appears intact. Bones are diffusely demineralized. No pelvic diastasis. IMPRESSION: Acute fracture involving the intertrochanteric and subtrochanteric aspect of the left femur with superolateral displacement of the femoral shaft component. Electronically Signed   By: Davina Poke D.O.   On: 05/14/2022 14:27   DG Shoulder Left  Result Date:  05/14/2022 CLINICAL DATA:  Fall, left shoulder pain EXAM: LEFT SHOULDER - 2+ VIEW COMPARISON:  03/24/2021 FINDINGS: Acute fracture of the left humeral head and neck. Oblique fracture of the humeral neck and proximal metaphysis with mild lateral displacement. Comminuted minimally displaced fracture involving the greater tuberosity. Advanced glenohumeral osteoarthritis. Intra-articular loose body or less likely fracture fragment at the superior joint line. Intact AC joint. Bones are demineralized. Soft tissue swelling of the upper arm. IMPRESSION: 1. Acute fracture of the left humeral head and neck. 2. Advanced glenohumeral osteoarthritis. Electronically Signed   By: Davina Poke D.O.   On: 05/14/2022 14:24    Labs on Admission: I have personally reviewed following labs  CBC: Recent Labs  Lab 05/14/22 1330  WBC 3.9*  NEUTROABS 2.9  HGB 11.3*  HCT 34.7*  MCV 93.3  PLT [id number]   Basic Metabolic Panel: Recent Labs  Lab 05/14/22 1330  NA 139  K 3.1*  CL 107  CO2 25  GLUCOSE 144*  BUN 12  CREATININE 0.70  CALCIUM 7.5*   GFR: Estimated Creatinine Clearance: 36.9 mL/min (by C-G formula based on SCr of 0.7 mg/dL).  Urine analysis:    Component Value Date/Time   COLORURINE AMBER (A) 01/05/2022 1810   APPEARANCEUR CLOUDY (A) 01/05/2022 1810   APPEARANCEUR Clear 06/25/2016 1321   LABSPEC 1.030 01/05/2022 1810   LABSPEC 1.008 08/27/2013 0110   PHURINE 5.0 01/05/2022 1810   GLUCOSEU NEGATIVE 01/05/2022 1810   GLUCOSEU Negative 08/27/2013 0110   HGBUR NEGATIVE 01/05/2022 1810   BILIRUBINUR NEGATIVE 01/05/2022 1810   BILIRUBINUR Negative 06/25/2016 1321   BILIRUBINUR Negative 08/27/2013 0110   KETONESUR NEGATIVE 01/05/2022 1810   PROTEINUR 30 (A) 01/05/2022 1810   NITRITE NEGATIVE 01/05/2022 1810   LEUKOCYTESUR MODERATE (A) 01/05/2022 1810   LEUKOCYTESUR Negative 08/27/2013 0110   This document was prepared using Dragon Voice Recognition software and may include unintentional  dictation errors.  Dr. Tobie Poet [facility]  If 7PM-7AM, please contact overnight-coverage provider If 7AM-7PM, please contact day coverage provider [email or web address]  05/14/2022, 4:53 PM

## 2022-05-14 NOTE — Progress Notes (Addendum)
1848 RRT called. Pt very lethargic and hypotensive bp automatic 50/38. Taken manually bp 60/42. RT, AC, ICU RN, and Dr Cox in room. Pt responds to questions and opens eyes. Order for 535ml bolus to be started. After bolus NS IV to infuse at 162ml/hr  1859 102/65 bp rechecked, bolus still infusing. RRT ended. Pt states she feels ok just still having some pain.

## 2022-05-14 NOTE — ED Provider Notes (Signed)
[facility] Provider Note    Event Date/Time   First MD Initiated Contact with Patient 05/14/22 1323     (approximate)   History   Chief Complaint Fall, Hip Pain, and Shoulder Injury   HPI  CAYCE PASCHAL is a 89 y.o. female with past medical history of hyperlipidemia, CAD, hypothyroidism, and dementia who presents to the ED following fall.  Patient was reportedly found down on the floor at her nursing facility complaining of pain in her left hip and left shoulder.  EMS was called and noted obvious deformity to her left hip.  They report that patient does not take any blood thinners, has been at her baseline mental status.     Physical Exam   Triage Vital Signs: ED Triage Vitals  Enc Vitals Group     BP      Pulse      Resp      Temp      Temp src      SpO2      Weight      Height      Head Circumference      Peak Flow      Pain Score      Pain Loc      Pain Edu?      Excl. in [place]?     Most recent vital signs: Vitals:   05/14/22 1322  BP: (!) 152/97  Pulse: 97  Resp: 16  Temp: 97.6 F (36.4 C)  SpO2: 99%    Constitutional: Alert and oriented to person and place, but not time or situation. Eyes: Conjunctivae are normal. Head: Atraumatic. Nose: No congestion/rhinnorhea. Mouth/Throat: Mucous membranes are moist.  Neck: No midline cervical spine tenderness to palpation. Cardiovascular: Normal rate, regular rhythm. Grossly normal heart sounds.  2+ radial and DP pulses bilaterally. Respiratory: Normal respiratory effort.  No retractions. Lungs CTAB. Gastrointestinal: Soft and nontender. No distention. Musculoskeletal: No lower extremity tenderness nor edema.  Neurologic:  Normal speech and language. No gross focal neurologic deficits are appreciated.    ED Results / Procedures / Treatments   Labs (all labs ordered are listed, but only abnormal results are displayed) Labs Reviewed  CBC WITH DIFFERENTIAL/PLATELET - Abnormal;  Notable for the following components:      Result Value   WBC 3.9 (*)    RBC 3.72 (*)    Hemoglobin 11.3 (*)    HCT 34.7 (*)    All other components within normal limits  BASIC METABOLIC PANEL - Abnormal; Notable for the following components:   Potassium 3.1 (*)    Glucose, Bld 144 (*)    Calcium 7.5 (*)    All other components within normal limits   RADIOLOGY Left hip x-ray reviewed and interpreted by me with subtrochanteric fracture just below area of prior pinning.  Left shoulder x-ray reviewed and interpreted by me with proximal humerus fracture, no dislocation.  PROCEDURES:  Critical Care performed: No  Procedures   MEDICATIONS ORDERED IN ED: Medications  potassium chloride SA (KLOR-CON M) CR tablet 40 mEq (has no administration in time range)  mirtazapine (REMERON) tablet 7.5 mg (has no administration in time range)  sertraline (ZOLOFT) tablet 50 mg (has no administration in time range)  traZODone (DESYREL) tablet 100 mg (has no administration in time range)  levothyroxine (SYNTHROID) tablet 125 mcg (has no administration in time range)  ferrous sulfate tablet 325 mg (has no administration in time range)  melatonin tablet 5 mg (  has no administration in time range)  divalproex (DEPAKOTE) DR tablet 250 mg (has no administration in time range)  lamoTRIgine (LAMICTAL) tablet 50 mg (has no administration in time range)  Muscle Rub CREA 1 Application (has no administration in time range)  HYDROcodone-acetaminophen (NORCO/VICODIN) 5-325 MG per tablet 1-2 tablet (has no administration in time range)  morphine (PF) 2 MG/ML injection 1 mg (has no administration in time range)  heparin injection 5,000 Units (has no administration in time range)  senna-docusate (Senokot-S) tablet 1 tablet (has no administration in time range)  morphine (PF) 4 MG/ML injection 4 mg (4 mg Intravenous Given 05/14/22 1337)     IMPRESSION / MDM / ASSESSMENT AND PLAN / ED COURSE  I reviewed the triage  vital signs and the nursing notes.                              89 y.o. female with past medical history of hyperlipidemia, CAD, hypothyroidism, and dementia who presents to the ED following unwitnessed fall at her nursing facility, now complains of left shoulder and hip pain.  Patient's presentation is most consistent with acute presentation with potential threat to life or bodily function.  Differential diagnosis includes, but is not limited to, hip fracture, dislocation, shoulder fracture, shoulder dislocation, intracranial injury, cervical spine injury.  Patient uncomfortable appearing with obvious deformity to her left hip and proximal humerus, remains neurovascularly intact with strong DP pulse.  She additionally has tenderness to palpation at her left shoulder with no obvious deformity.  No evidence of significant trauma to her head or neck area, but given unclear history we will check CT head and cervical spine.  Labs remarkable for mild hypokalemia, no significant AKI, anemia, or leukocytosis noted.  Left shoulder x-ray shows proximal humerus fracture which we will manage with sling.  Patient also with subtrochanteric fracture of the left hip just below prior pin placement.  Imaging was reviewed with Dr. Harlow Mares of orthopedics, given patient is currently followed by hospice, he will discuss surgical options with family.  I had spoken with patient's daughter regarding surgery versus comfort measures, and she requests time to speak with additional family members.  Case discussed with hospitalist for admission.      FINAL CLINICAL IMPRESSION(S) / ED DIAGNOSES   Final diagnoses:  Fall, initial encounter  Closed fracture of left hip, initial encounter ([place])  Closed fracture of proximal end of left humerus, unspecified fracture morphology, initial encounter     Rx / DC Orders   ED Discharge Orders     None        Note:  This document was prepared using Dragon voice recognition  software and may include unintentional dictation errors.   Blake Divine, MD 05/14/22 [phone]

## 2022-05-14 NOTE — Assessment & Plan Note (Addendum)
-   Fall precautions - Orthopedic service has been consulted to Dr. Harlow Mares via EDP

## 2022-05-14 NOTE — ED Notes (Signed)
IV morphine given for pt pain. Due to patient fracture will be hard to sit patient up in appropriate seated position to swallow pills safely. RN advised DO Cox the last dose of morphine was given at 1337 and the PRN dose is due to be given Q4H. DO Cox states "she can have the dose because she is in a lot of pain."

## 2022-05-14 NOTE — ED Notes (Signed)
Pt refused EKG per [employer]

## 2022-05-14 NOTE — Assessment & Plan Note (Signed)
-   Depakote 250 mg p.o. twice daily, lamotrigine 50 mg p.o. twice daily resumed on admission

## 2022-05-14 NOTE — Assessment & Plan Note (Signed)
-   Check magnesium level - Polycitra solution 40 mEq p.o. one-time dose ordered - EKG ordered, discussed with nursing via secure chat to complete and upload to epic

## 2022-05-14 NOTE — Progress Notes (Signed)
[place] Patient  Current hospice patient followed for abnormal weight loss who fell at [facility], [place], patient's ALF.  She is being admitted for left hip fracture and left humerus fracture.  Per. Dr. Jewel Baize, this is a related hospital admission.  Per hospital report, patient is alert and oriented to self.  She complains of left sided pain and was given Morphine 4mg  IV for this complaint.  Patient will need ongoing symptom management for uncontrolled pain following multiple fractures.  Imaging:   Expand All Collapse All   History and Physical    ELTHA TINGLEY IWP:[id number] DOB: 21-May-1928 DOA: 05/14/2022   PCP: Orvis Brill[facility]  Outpatient Specialists: Dr. Alice Reichert, GI Patient coming from: [place] via EMS   I have personally briefly reviewed patient's old medical records in [facility].   Chief Concern: Fall   HPI: Ms. Cordella Nyquist is a 89 year old female with history of dementia, anxiety, recurrent major depression in partial remission, insomnia, history of fall with left hip fracture status post cannulated hip pinning in March 2021, who presents emergency department for chief concerns of a fall.   Initial vitals in the ED showed temperature of 97.6, respiration rate of 16, heart rate of 97, blood pressure 152/97, SpO2 of 99% on room air.   Serum sodium is 139, potassium 3.1, chloride 107, bicarb 25, BUN of 12, serum creatinine of 0.70, eGFR greater than 60, nonfasting blood glucose 144, WBC 3.9, hemoglobin 11.3, platelets of 167.   Imaging   Left shoulder x-ray: acute fracture of left humeral head and neck.  Advanced glenohumeral osteoarthritis.   Left femur: Acute fracture involving intertrochanteric and subtrochanteric aspect of the left femur with superolateral displacement of the femoral shaft component.   Pelvis 2 view: Acute fracture involving intertrochanteric and subtrochanteric aspect of the left  femur with superior lateral displacement of the femoral shaft component.     Hospital Problems:   Femur fracture, left [facility]) - Fall precautions - Orthopedic service has been consulted to Dr. Harlow Mares via EDP   2.  Hypokalemia - Check magnesium level - Polycitra solution 40 mEq p.o. one-time dose ordered - EKG ordered, discussed with nursing via secure chat to complete and upload to epic   3.  Closed left humeral fracture Acute fracture of left humeral head and neck - Orthopedic service has been consulted to Dr. Harlow Mares via EDP and via epic order by myself - Symptomatic support: morphine 1 mg IV every 4 hours as needed for severe pain, 4 doses ordered; fentanyl 12.5 mg IV every 2 hours as needed for severe pain, 4 doses ordered - AM team to evaluate patient at bedside to determine continued opioid pain medication requirements - Admit to MedSurg, inpatient   4.  Dementia without behavioral disturbance (HCC) - Depakote 250 mg p.o. twice daily, lamotrigine 50 mg p.o. twice daily resumed on admission   5.  Depression with anxiety - Sertraline 50 mg daily, mirtazapine 7.5 mg nightly, trazodone 100 mg nightly were resumed on admission   6.  Acquired hypothyroidism - Levothyroxine 125 mcg daily resumed  Discharge Planning:  Ongoing-  Patient pending Ortho consult tomorrow.  Family contact:  Daughter at bedside with her mother.  Continue with family updates and contact.  IDT:  Updated    Goals of Care-   ongoing.  Remains DNR  Will continue to follow through final disposition.  Dimas Aguas, RN Nurse Liaison [phone]

## 2022-05-14 NOTE — Assessment & Plan Note (Signed)
-   Levothyroxine 125 mcg daily resumed 

## 2022-05-15 ENCOUNTER — Encounter: Payer: Self-pay | Admitting: Internal Medicine

## 2022-05-15 DIAGNOSIS — S72302A Unspecified fracture of shaft of left femur, initial encounter for closed fracture: Secondary | ICD-10-CM | POA: Diagnosis not present

## 2022-05-15 LAB — BASIC METABOLIC PANEL
Anion gap: 5 (ref 5–15)
BUN: 18 mg/dL (ref 8–23)
CO2: 25 mmol/L (ref 22–32)
Calcium: 7.2 mg/dL — ABNORMAL LOW (ref 8.9–10.3)
Chloride: 108 mmol/L (ref 98–111)
Creatinine, Ser: 0.83 mg/dL (ref 0.44–1.00)
GFR, Estimated: >60 mL/min (ref >60)
Glucose, Bld: 138 mg/dL — ABNORMAL HIGH (ref 70–99)
Potassium: 3.8 mmol/L (ref 3.5–5.1)
Sodium: 138 mmol/L (ref 135–145)

## 2022-05-15 LAB — IRON AND TIBC
Iron: 12 ug/dL — ABNORMAL LOW (ref 28–170)
Saturation Ratios: 7 % — ABNORMAL LOW (ref 10.4–31.8)
TIBC: 162 ug/dL — ABNORMAL LOW (ref 250–450)
UIBC: 150 ug/dL

## 2022-05-15 LAB — CBC
HCT: 26.9 % — ABNORMAL LOW (ref 36.0–46.0)
Hemoglobin: 8.7 g/dL — ABNORMAL LOW (ref 12.0–15.0)
MCH: 30.5 pg (ref 26.0–34.0)
MCHC: 32.3 g/dL (ref 30.0–36.0)
MCV: 94.4 fL (ref 80.0–100.0)
Platelets: 150 10*3/uL (ref 150–400)
RBC: 2.85 MIL/uL — ABNORMAL LOW (ref 3.87–5.11)
RDW: 15.4 % (ref 11.5–15.5)
WBC: 6.9 10*3/uL (ref 4.0–10.5)
nRBC: 0 % (ref 0.0–0.2)

## 2022-05-15 LAB — MAGNESIUM: Magnesium: 1.8 mg/dL (ref 1.7–2.4)

## 2022-05-15 LAB — PROCALCITONIN: Procalcitonin: <0.1 ng/mL

## 2022-05-15 LAB — VITAMIN B12: Vitamin B-12: 250 pg/mL (ref 180–914)

## 2022-05-15 LAB — FERRITIN: Ferritin: 90 ng/mL (ref 11–307)

## 2022-05-15 MED ORDER — MORPHINE SULFATE (PF) 2 MG/ML IV SOLN
2.0000 mg | INTRAVENOUS | Status: DC | PRN
  Administered 2022-05-15: 2 mg via INTRAVENOUS
  Filled 2022-05-15: qty 1

## 2022-05-15 NOTE — Progress Notes (Signed)
Nutrition Brief Note  Chart reviewed. Pt now transitioning to comfort care.  No further nutrition interventions planned at this time.  Please re-consult as needed.   Belina Mandile W, RD, LDN, CDCES Registered Dietitian II Certified Diabetes Care and Education Specialist Please refer to [facility] for RD and/or RD on-call/weekend/after hours pager   

## 2022-05-15 NOTE — Discharge Summary (Signed)
Physician Discharge Summary   Patient: Kerry Cabrera MRN: [id number] DOB: 1929/02/25  Admit date:     05/14/2022  Discharge date: 05/15/22  Discharge Physician: Jennye Boroughs   PCP: [facility]   Recommendations at discharge:   Follow-up with hospice team at the hospice house within 24 hours  Discharge Diagnoses: Principal Problem:   Femur fracture, left ([place]) Active Problems:   Acquired hypothyroidism   CAD (coronary artery disease)   Hyperlipidemia, mixed   GERD (gastroesophageal reflux disease)   Depression with anxiety   Fall   Dementia without behavioral disturbance (HCC)   Closed left humeral fracture   Hypokalemia   Dyspnea  Resolved Problems:   * No resolved hospital problems. *  Hospital Course:   Kerry Cabrera is a 89 y.o. female with history of dementia, anxiety, recurrent major depression in partial remission, insomnia, history of fall with left hip fracture status post cannulated hip pinning in March 2021.  She presented to the emergency department after a fall.  She complained of pain in the left hip and shoulder pain.     Workup revealed closed left humeral fracture and closed left femur fracture.  She was treated with analgesics.  Orthopedic surgeon was consulted in anticipation of left hip surgery.  However, [name], daughter and HPOA, declined surgery.  She requested comfort measures at the hospice home.  She was evaluated by the hospice team and she was deemed eligible for comfort measures at the hospice home.  She will be discharged to the hospice home today.          Consultants: Orthopedic surgeon Procedures performed: None Disposition:  Hospice home Diet recommendation:  Discharge Diet Orders (From admission, onward)     Start     Ordered   05/15/22 0000  Diet general        05/15/22 1754           Regular diet DISCHARGE MEDICATION: Allergies as of 05/15/2022       Reactions   Ace Inhibitors Cough   Codeine  Nausea And Vomiting, Nausea Only   Gabapentin Other (See Comments)   unsure   Micardis Hct [telmisartan-hctz]    unsure   Prednisone Other (See Comments)   Pt has received cortisone injection for knee with out problems. Pt states she will not take pills for extended time due to feeling lethargic and weight gain. Weight gain   Sulfa Antibiotics Other (See Comments), Rash   Fever 106 degrees,  sent to ER High fever   Zithromax [azithromycin] Other (See Comments), Rash   unsure        Medication List     STOP taking these medications    amLODipine 5 MG tablet Commonly known as: NORVASC   B12 Folate 800-800 MCG Caps   Biofreeze 4 % Gel Generic drug: Menthol (Topical Analgesic)   divalproex 250 MG DR tablet Commonly known as: DEPAKOTE   donepezil 10 MG tablet Commonly known as: ARICEPT   ferrous sulfate 325 (65 FE) MG tablet   gabapentin 100 MG capsule Commonly known as: NEURONTIN   LaMICtal 25 MG tablet Generic drug: lamoTRIgine   levothyroxine 125 MCG tablet Commonly known as: SYNTHROID   levothyroxine 137 MCG tablet Commonly known as: SYNTHROID   levothyroxine 150 MCG tablet Commonly known as: SYNTHROID   lidocaine 5 % Commonly known as: Lidoderm   mirtazapine 7.5 MG tablet Commonly known as: REMERON   MULTIVITAMIN ADULT (MINERALS) PO   omeprazole 40 MG capsule  Commonly known as: PRILOSEC   sertraline 50 MG tablet Commonly known as: ZOLOFT   sucralfate 1 g tablet Commonly known as: CARAFATE       TAKE these medications    melatonin 3 MG Tabs tablet Take 6 mg by mouth at bedtime.   traZODone 100 MG tablet Commonly known as: DESYREL Take 100 mg by mouth at bedtime.   Tylenol 325 MG tablet Generic drug: acetaminophen Take 650 mg by mouth 3 (three) times daily as needed. What changed: Another medication with the same name was removed. Continue taking this medication, and follow the directions you see here.        Discharge  Exam: Filed Weights   05/14/22 1325  Weight: 51 kg   GEN: NAD SKIN: Warm and dry. EYES: No pallor or icterus ENT: MMM CV: RRR PULM: CTA B ABD: soft, ND, NT, +BS CNS: Alert, confused, non focal EXT: Left lower extremity looks shorter than the right and is externally rotated.  Left hip tenderness.  Bruising, tenderness and swelling of the left arm    Condition at discharge: stable  The results of significant diagnostics from this hospitalization (including imaging, microbiology, ancillary and laboratory) are listed below for reference.   Imaging Studies: DG Chest Port 1 View  Result Date: 05/14/2022 CLINICAL DATA:  Dyspnea EXAM: PORTABLE CHEST 1 VIEW COMPARISON:  Chest radiograph dated 03/24/2021 FINDINGS: Asymmetric low right lung volumes with slightly increased interstitial opacities. Left-greater-than-right basilar opacities. Unchanged right lower lung lucency in keeping with known large hiatal hernia. No pleural effusion or pneumothorax. The heart size and mediastinal contours are within normal limits. Partially imaged left proximal humerus fracture is better evaluated on same day shoulder radiograph. IMPRESSION: 1. Known large hiatal hernia resulting in asymmetric low right lung volumes with slightly increased interstitial opacities, which could be due to pulmonary edema or atypical infection. 2. Left-greater-than-right basilar opacities, likely atelectasis. Electronically Signed   By: Darrin Nipper M.D.   On: 05/14/2022 20:32   CT Cervical Spine Wo Contrast  Result Date: 05/14/2022 CLINICAL DATA:  Neck trauma (Age >= 65y) Dementia patient post unwitnessed fall. EXAM: CT CERVICAL SPINE WITHOUT CONTRAST TECHNIQUE: Multidetector CT imaging of the cervical spine was performed without intravenous contrast. Multiplanar CT image reconstructions were also generated. RADIATION DOSE REDUCTION: This exam was performed according to the departmental dose-optimization program which includes automated  exposure control, adjustment of the mA and/or kV according to patient size and/or use of iterative reconstruction technique. COMPARISON:  Cervical and thoracic spine CT 11/19/2021 FINDINGS: Alignment: No traumatic subluxation. Skull base and vertebrae: No acute fracture. Vertebral body heights are maintained. The dens and skull base are intact. Stable bone island within anterior right aspect of C1 arch. Soft tissues and spinal canal: No prevertebral fluid or swelling. No visible canal hematoma. Chronic C1-C2 pannus. Disc levels: Stable degenerative disc disease with mild facet hypertrophy. Upper chest: Chronic T4 compression deformity is stable from prior exam. Small right pleural effusion is partially visualized. Other: Carotid calcifications. IMPRESSION: 1. No acute fracture or traumatic subluxation of the cervical spine. 2. Chronic T4 compression deformity. 3. Small right pleural effusion is partially visualized. Electronically Signed   By: Keith Rake M.D.   On: 05/14/2022 15:52   CT Head Wo Contrast  Result Date: 05/14/2022 CLINICAL DATA:  Head trauma, minor (Age >= 65y) Unwitnessed fall. EXAM: CT HEAD WITHOUT CONTRAST TECHNIQUE: Contiguous axial images were obtained from the base of the skull through the vertex without intravenous contrast. RADIATION DOSE  REDUCTION: This exam was performed according to the departmental dose-optimization program which includes automated exposure control, adjustment of the mA and/or kV according to patient size and/or use of iterative reconstruction technique. COMPARISON:  Head CT 11/19/2021 FINDINGS: Brain: No acute intracranial hemorrhage. No subdural or extra-axial collection. Stable degree of atrophy and chronic small vessel ischemic change. Stable ventricular size without hydrocephalus. No evidence of acute ischemia, no midline shift or mass effect. Vascular: Atherosclerosis of skullbase vasculature without hyperdense vessel or abnormal calcification. Skull: No  fracture or focal lesion. Sinuses/Orbits: Paranasal sinuses and mastoid air cells are clear. The visualized orbits are unremarkable. Bilateral lens resection. Other: No large scalp hematoma. IMPRESSION: 1. No acute intracranial abnormality. No skull fracture. 2. Stable atrophy and chronic small vessel ischemic change. Electronically Signed   By: Narda Rutherford M.D.   On: 05/14/2022 15:46   DG Femur Min 2 Views Left  Result Date: 05/14/2022 CLINICAL DATA:  Fall, left hip pain EXAM: LEFT FEMUR 2 VIEWS; PELVIS - 1-2 VIEW COMPARISON:  06/21/2019 FINDINGS: Acute fracture involving the intertrochanteric and subtrochanteric aspect of the left femur with superolateral displacement of the femoral shaft component. Prior proximal left femoral ORIF with 3 partially threaded screws. Fracture involves the hardware in location. Hip joint alignment is maintained without dislocation. Prior left total knee arthroplasty without apparent complication. The imaged right total hip arthroplasty hardware appears intact. Bones are diffusely demineralized. No pelvic diastasis. IMPRESSION: Acute fracture involving the intertrochanteric and subtrochanteric aspect of the left femur with superolateral displacement of the femoral shaft component. Electronically Signed   By: Duanne Guess D.O.   On: 05/14/2022 14:27   DG Pelvis 1-2 Views  Result Date: 05/14/2022 CLINICAL DATA:  Fall, left hip pain EXAM: LEFT FEMUR 2 VIEWS; PELVIS - 1-2 VIEW COMPARISON:  06/21/2019 FINDINGS: Acute fracture involving the intertrochanteric and subtrochanteric aspect of the left femur with superolateral displacement of the femoral shaft component. Prior proximal left femoral ORIF with 3 partially threaded screws. Fracture involves the hardware in location. Hip joint alignment is maintained without dislocation. Prior left total knee arthroplasty without apparent complication. The imaged right total hip arthroplasty hardware appears intact. Bones are diffusely  demineralized. No pelvic diastasis. IMPRESSION: Acute fracture involving the intertrochanteric and subtrochanteric aspect of the left femur with superolateral displacement of the femoral shaft component. Electronically Signed   By: Duanne Guess D.O.   On: 05/14/2022 14:27   DG Shoulder Left  Result Date: 05/14/2022 CLINICAL DATA:  Fall, left shoulder pain EXAM: LEFT SHOULDER - 2+ VIEW COMPARISON:  03/24/2021 FINDINGS: Acute fracture of the left humeral head and neck. Oblique fracture of the humeral neck and proximal metaphysis with mild lateral displacement. Comminuted minimally displaced fracture involving the greater tuberosity. Advanced glenohumeral osteoarthritis. Intra-articular loose body or less likely fracture fragment at the superior joint line. Intact AC joint. Bones are demineralized. Soft tissue swelling of the upper arm. IMPRESSION: 1. Acute fracture of the left humeral head and neck. 2. Advanced glenohumeral osteoarthritis. Electronically Signed   By: Duanne Guess D.O.   On: 05/14/2022 14:24    Microbiology: Results for orders placed or performed during the hospital encounter of 05/14/22  MRSA Next Gen by PCR, Nasal     Status: None   Collection Time: 05/14/22  5:57 PM   Specimen: Nasal Mucosa; Nasal Swab  Result Value Ref Range Status   MRSA by PCR Next Gen NOT DETECTED NOT DETECTED Final    Comment: (NOTE) The GeneXpert MRSA Assay (FDA approved  for NASAL specimens only), is one component of a comprehensive MRSA colonization surveillance program. It is not intended to diagnose MRSA infection nor to guide or monitor treatment for MRSA infections. Test performance is not FDA approved in patients less than 46 years old. Performed at [facility], [address] [place], [place] [address]     Labs: CBC: Recent Labs  Lab 05/14/22 1330 05/15/22 0339  WBC 3.9* 6.9  NEUTROABS 2.9  --   HGB 11.3* 8.7*  HCT 34.7* 26.9*  MCV 93.3 94.4  PLT 167 938   Basic  Metabolic Panel: Recent Labs  Lab 05/14/22 1330 05/15/22 0339  NA 139 138  K 3.1* 3.8  CL 107 108  CO2 25 25  GLUCOSE 144* 138*  BUN 12 18  CREATININE 0.70 0.83  CALCIUM 7.5* 7.2*  MG  --  1.8   Liver Function Tests: No results for input(s): "AST", "ALT", "ALKPHOS", "BILITOT", "PROT", "ALBUMIN" in the last 168 hours. CBG: Recent Labs  Lab 05/14/22 1850  GLUCAP 152*    Discharge time spent: greater than 30 minutes.  Signed: Jennye Boroughs, MD [facility] 05/15/2022

## 2022-05-15 NOTE — Progress Notes (Addendum)
Progress Note    Kerry Cabrera  OVF:[id number] DOB: [birth year]  DOA: 05/14/2022 PCP: Almetta Lovely, [facility]      Brief Narrative:    Medical records reviewed and are as summarized below:  Kerry Cabrera is a 89 y.o. female with history of dementia, anxiety, recurrent major depression in partial remission, insomnia, history of fall with left hip fracture status post cannulated hip pinning in March 2021.  She presented to the emergency department after a fall.  She complained of pain in the left hip and shoulder pain.   Workup revealed closed left humeral fracture and closed left femur fracture.  She was treated with analgesics.  Orthopedic surgeon was consulted in anticipation of left hip surgery.  However, Cathlean Cower, daughter and HPOA, declined surgery.  She requested comfort measures at the hospice home.     Assessment/Plan:   Principal Problem:   Femur fracture, left (HCC) Active Problems:   Acquired hypothyroidism   CAD (coronary artery disease)   Hyperlipidemia, mixed   GERD (gastroesophageal reflux disease)   Depression with anxiety   Fall   Dementia without behavioral disturbance (HCC)   Closed left humeral fracture   Hypokalemia   Dyspnea    Body mass index is 18.71 kg/m.   Closed left femur fracture, closed left humeral fracture: Analgesics as needed for pain.  Orthopedic surgeon has been consulted.  Plan for left hip surgery.   Hypokalemia: Improved   Hypotension: Improved.  Amlodipine on hold.   Anemia: Some of this may be from hemodilution.  Blood count dropped from 11.3-8.7.  Check iron and vitamin B12 levels.  Monitor H&H.   Dementia: Continue psychotropics and supportive care   Comorbidities include depression, anxiety, CAD, hypothyroidism, hyperlipidemia   ADDENDUM  Plan of care was discussed with Cathlean Cower, daughter, over the phone.  She has declined left hip surgery.  She prefers that patient be discharged to hospice home for  comfort care.  We discussed initiating comfort measures in the hospital if desired and stopping most of her routine home medicines.  She was okay with starting comfort measures in the hospital.  Deliliah, case manager and Diannia Ruder, hospice nurse, have been updated    Diet Order             Diet NPO time specified  Diet effective midnight                            Consultants: Orthopedic surgeon  Procedures: None    Medications:    divalproex  250 mg Oral BID   ferrous sulfate  325 mg Oral Q1200   lamoTRIgine  50 mg Oral BID   levothyroxine  125 mcg Oral Q0600   melatonin  5 mg Oral QHS   mirtazapine  7.5 mg Oral QHS   Muscle Rub  1 Application Topical QHS   sertraline  50 mg Oral Daily   traZODone  100 mg Oral QHS   Continuous Infusions:   Anti-infectives (From admission, onward)    None              Family Communication/Anticipated D/C date and plan/Code Status   DVT prophylaxis: Place TED hose Start: 05/14/22 1501     Code Status: DNR  Family Communication: Plan discussed with Cathlean Cower, daughter, over the phone Disposition Plan: Plan to discharge to hospice home   Status is: Inpatient Remains inpatient appropriate because: Comfort care awaiting discharge to Hospice Home  Subjective:   Interval events noted.  She is confused and cannot provide an adequate history.  She complains of pain in the left hip.  She said "I am crazy because I am listening to that music"  Objective:    Vitals:   05/15/22 0423 05/15/22 0437 05/15/22 0525 05/15/22 0740  BP: 100/68 100/68 (!) 103/52 119/63  Pulse: (!) 121 (!) 118 99 96  Resp: 20 19 16    Temp: 97.7 F (36.5 C) 97.7 F (36.5 C) 97.7 F (36.5 C) 98.3 F (36.8 C)  TempSrc:  Oral    SpO2: 100% 100% 100% 99%  Weight:      Height:       No data found.  No intake or output data in the 24 hours ending 05/15/22 1028 Filed Weights   05/14/22 1325  Weight: 51 kg    Exam:  GEN:  NAD SKIN: Warm and dry. EYES: No pallor or icterus ENT: MMM CV: RRR PULM: CTA B ABD: soft, ND, NT, +BS CNS: Alert, confused, non focal EXT: Left lower extremity looks shorter than the right and is externally rotated.  Left hip tenderness.  Bruising, tenderness and swelling of the left arm        Data Reviewed:   I have personally reviewed following labs and imaging studies:  Labs: Labs show the following:   Basic Metabolic Panel: Recent Labs  Lab 05/14/22 1330 05/15/22 0339  NA 139 138  K 3.1* 3.8  CL 107 108  CO2 25 25  GLUCOSE 144* 138*  BUN 12 18  CREATININE 0.70 0.83  CALCIUM 7.5* 7.2*  MG  --  1.8   GFR Estimated Creatinine Clearance: 35.5 mL/min (by C-G formula based on SCr of 0.83 mg/dL). Liver Function Tests: No results for input(s): "AST", "ALT", "ALKPHOS", "BILITOT", "PROT", "ALBUMIN" in the last 168 hours. No results for input(s): "LIPASE", "AMYLASE" in the last 168 hours. No results for input(s): "AMMONIA" in the last 168 hours. Coagulation profile No results for input(s): "INR", "PROTIME" in the last 168 hours.  CBC: Recent Labs  Lab 05/14/22 1330 05/15/22 0339  WBC 3.9* 6.9  NEUTROABS 2.9  --   HGB 11.3* 8.7*  HCT 34.7* 26.9*  MCV 93.3 94.4  PLT 167 150   Cardiac Enzymes: No results for input(s): "CKTOTAL", "CKMB", "CKMBINDEX", "TROPONINI" in the last 168 hours. BNP (last 3 results) No results for input(s): "PROBNP" in the last 8760 hours. CBG: Recent Labs  Lab 05/14/22 1850  GLUCAP 152*   D-Dimer: No results for input(s): "DDIMER" in the last 72 hours. Hgb A1c: No results for input(s): "HGBA1C" in the last 72 hours. Lipid Profile: No results for input(s): "CHOL", "HDL", "LDLCALC", "TRIG", "CHOLHDL", "LDLDIRECT" in the last 72 hours. Thyroid function studies: No results for input(s): "TSH", "T4TOTAL", "T3FREE", "THYROIDAB" in the last 72 hours.  Invalid input(s): "FREET3" Anemia work up: No results for input(s):  "VITAMINB12", "FOLATE", "FERRITIN", "TIBC", "IRON", "RETICCTPCT" in the last 72 hours. Sepsis Labs: Recent Labs  Lab 05/14/22 1330 05/14/22 1920 05/15/22 0339  PROCALCITON  --  <0.10 <0.10  WBC 3.9*  --  6.9    Microbiology Recent Results (from the past 240 hour(s))  MRSA Next Gen by PCR, Nasal     Status: None   Collection Time: 05/14/22  5:57 PM   Specimen: Nasal Mucosa; Nasal Swab  Result Value Ref Range Status   MRSA by PCR Next Gen NOT DETECTED NOT DETECTED Final    Comment: (NOTE) The GeneXpert MRSA  Assay (FDA approved for NASAL specimens only), is one component of a comprehensive MRSA colonization surveillance program. It is not intended to diagnose MRSA infection nor to guide or monitor treatment for MRSA infections. Test performance is not FDA approved in patients less than 73 years old. Performed at [facility], [address] [place], [place] [address]     Procedures and diagnostic studies:  DG Chest [facility] 1 View  Result Date: 05/14/2022 CLINICAL DATA:  Dyspnea EXAM: PORTABLE CHEST 1 VIEW COMPARISON:  Chest radiograph dated 03/24/2021 FINDINGS: Asymmetric low right lung volumes with slightly increased interstitial opacities. Left-greater-than-right basilar opacities. Unchanged right lower lung lucency in keeping with known large hiatal hernia. No pleural effusion or pneumothorax. The heart size and mediastinal contours are within normal limits. Partially imaged left proximal humerus fracture is better evaluated on same day shoulder radiograph. IMPRESSION: 1. Known large hiatal hernia resulting in asymmetric low right lung volumes with slightly increased interstitial opacities, which could be due to pulmonary edema or atypical infection. 2. Left-greater-than-right basilar opacities, likely atelectasis. Electronically Signed   By: Darrin Nipper M.D.   On: 05/14/2022 20:32   CT Cervical Spine Wo Contrast  Result Date: 05/14/2022 CLINICAL DATA:  Neck trauma (Age >=  65y) Dementia patient post unwitnessed fall. EXAM: CT CERVICAL SPINE WITHOUT CONTRAST TECHNIQUE: Multidetector CT imaging of the cervical spine was performed without intravenous contrast. Multiplanar CT image reconstructions were also generated. RADIATION DOSE REDUCTION: This exam was performed according to the departmental dose-optimization program which includes automated exposure control, adjustment of the mA and/or kV according to patient size and/or use of iterative reconstruction technique. COMPARISON:  Cervical and thoracic spine CT 11/19/2021 FINDINGS: Alignment: No traumatic subluxation. Skull base and vertebrae: No acute fracture. Vertebral body heights are maintained. The dens and skull base are intact. Stable bone island within anterior right aspect of C1 arch. Soft tissues and spinal canal: No prevertebral fluid or swelling. No visible canal hematoma. Chronic C1-C2 pannus. Disc levels: Stable degenerative disc disease with mild facet hypertrophy. Upper chest: Chronic T4 compression deformity is stable from prior exam. Small right pleural effusion is partially visualized. Other: Carotid calcifications. IMPRESSION: 1. No acute fracture or traumatic subluxation of the cervical spine. 2. Chronic T4 compression deformity. 3. Small right pleural effusion is partially visualized. Electronically Signed   By: Keith Rake M.D.   On: 05/14/2022 15:52   CT Head Wo Contrast  Result Date: 05/14/2022 CLINICAL DATA:  Head trauma, minor (Age >= 65y) Unwitnessed fall. EXAM: CT HEAD WITHOUT CONTRAST TECHNIQUE: Contiguous axial images were obtained from the base of the skull through the vertex without intravenous contrast. RADIATION DOSE REDUCTION: This exam was performed according to the departmental dose-optimization program which includes automated exposure control, adjustment of the mA and/or kV according to patient size and/or use of iterative reconstruction technique. COMPARISON:  Head CT 11/19/2021 FINDINGS:  Brain: No acute intracranial hemorrhage. No subdural or extra-axial collection. Stable degree of atrophy and chronic small vessel ischemic change. Stable ventricular size without hydrocephalus. No evidence of acute ischemia, no midline shift or mass effect. Vascular: Atherosclerosis of skullbase vasculature without hyperdense vessel or abnormal calcification. Skull: No fracture or focal lesion. Sinuses/Orbits: Paranasal sinuses and mastoid air cells are clear. The visualized orbits are unremarkable. Bilateral lens resection. Other: No large scalp hematoma. IMPRESSION: 1. No acute intracranial abnormality. No skull fracture. 2. Stable atrophy and chronic small vessel ischemic change. Electronically Signed   By: Keith Rake M.D.   On: 05/14/2022  15:46   DG Femur Min 2 Views Left  Result Date: 05/14/2022 CLINICAL DATA:  Fall, left hip pain EXAM: LEFT FEMUR 2 VIEWS; PELVIS - 1-2 VIEW COMPARISON:  06/21/2019 FINDINGS: Acute fracture involving the intertrochanteric and subtrochanteric aspect of the left femur with superolateral displacement of the femoral shaft component. Prior proximal left femoral ORIF with 3 partially threaded screws. Fracture involves the hardware in location. Hip joint alignment is maintained without dislocation. Prior left total knee arthroplasty without apparent complication. The imaged right total hip arthroplasty hardware appears intact. Bones are diffusely demineralized. No pelvic diastasis. IMPRESSION: Acute fracture involving the intertrochanteric and subtrochanteric aspect of the left femur with superolateral displacement of the femoral shaft component. Electronically Signed   By: Davina Poke D.O.   On: 05/14/2022 14:27   DG Pelvis 1-2 Views  Result Date: 05/14/2022 CLINICAL DATA:  Fall, left hip pain EXAM: LEFT FEMUR 2 VIEWS; PELVIS - 1-2 VIEW COMPARISON:  06/21/2019 FINDINGS: Acute fracture involving the intertrochanteric and subtrochanteric aspect of the left femur with  superolateral displacement of the femoral shaft component. Prior proximal left femoral ORIF with 3 partially threaded screws. Fracture involves the hardware in location. Hip joint alignment is maintained without dislocation. Prior left total knee arthroplasty without apparent complication. The imaged right total hip arthroplasty hardware appears intact. Bones are diffusely demineralized. No pelvic diastasis. IMPRESSION: Acute fracture involving the intertrochanteric and subtrochanteric aspect of the left femur with superolateral displacement of the femoral shaft component. Electronically Signed   By: Davina Poke D.O.   On: 05/14/2022 14:27   DG Shoulder Left  Result Date: 05/14/2022 CLINICAL DATA:  Fall, left shoulder pain EXAM: LEFT SHOULDER - 2+ VIEW COMPARISON:  03/24/2021 FINDINGS: Acute fracture of the left humeral head and neck. Oblique fracture of the humeral neck and proximal metaphysis with mild lateral displacement. Comminuted minimally displaced fracture involving the greater tuberosity. Advanced glenohumeral osteoarthritis. Intra-articular loose body or less likely fracture fragment at the superior joint line. Intact AC joint. Bones are demineralized. Soft tissue swelling of the upper arm. IMPRESSION: 1. Acute fracture of the left humeral head and neck. 2. Advanced glenohumeral osteoarthritis. Electronically Signed   By: Davina Poke D.O.   On: 05/14/2022 14:24               LOS: 1 day   Penny Arrambide  [facility]   Pager on [email or web address]. If 7PM-7AM, please contact night-coverage at [email or web address]     05/15/2022, 10:28 AM

## 2022-05-15 NOTE — Progress Notes (Signed)
Consulted to start I.V. on patient being discharged to hospice. Patient refuses. RN spoke with patient about the need for the I.V. and she refuses.

## 2022-05-15 NOTE — Plan of Care (Signed)

## 2022-05-15 NOTE — Consult Note (Signed)
ORTHOPAEDIC CONSULTATION  REQUESTING PHYSICIAN: Jennye Boroughs, MD  Chief Complaint: left hip and shoulder pain  HPI: Kerry Cabrera is a 89 y.o. female who complains of left hip and shoulder pain after fall. The pain is sharp in character. The pain is severe and 7/10. The pain is worse with movement and better with rest. Denies any numbness, tingling or constitutional symptoms.  Past Medical History:  Diagnosis Date   Arthritis    Carotid artery occlusion    Complication of anesthesia    very confused for days   Coronary artery disease    Depression    Glaucoma    Hyperlipemia    Hypertension    h/o not on medication   Hypothyroidism    Insomnia    PUD (peptic ulcer disease)    in distant past   TIA (transient ischemic attack)    Past Surgical History:  Procedure Laterality Date   CARDIAC CATHETERIZATION     HIP PINNING,CANNULATED Left 06/21/2019   Procedure: CANNULATED HIP PINNING;  Surgeon: Corky Mull, MD;  Location: [facility] ORS;  Service: Orthopedics;  Laterality: Left;   KNEE ARTHROPLASTY Left 08/08/2015   Procedure: COMPUTER ASSISTED TOTAL KNEE ARTHROPLASTY;  Surgeon: Dereck Leep, MD;  Location: [facility] ORS;  Service: Orthopedics;  Laterality: Left;   KNEE ARTHROSCOPY     Percutaneous pinning of right femoral neck fracture     TONSILLECTOMY     TOTAL HIP ARTHROPLASTY Right 02/06/2014   Dr. Earnestine Leys   Social History   Socioeconomic History   Marital status: Widowed    Spouse name: Not on file   Number of children: Not on file   Years of education: Not on file   Highest education level: Not on file  Occupational History   Not on file  Tobacco Use   Smoking status: Former    Years: 12.00    Types: Cigarettes   Smokeless tobacco: Never  Substance and Sexual Activity   Alcohol use: Not Currently    Comment: osccasional   Drug use: No   Sexual activity: Not on file  Other Topics Concern   Not on file  Social History Narrative   Not on file    Social Determinants of Health   Financial Resource Strain: Not on file  Food Insecurity: No Food Insecurity (05/14/2022)   Hunger Vital Sign    Worried About Running Out of Food in the Last Year: Never true    Ran Out of Food in the Last Year: Never true  Transportation Needs: No Transportation Needs (05/14/2022)   PRAPARE - Hydrologist (Medical): No    Lack of Transportation (Non-Medical): No  Physical Activity: Not on file  Stress: Not on file  Social Connections: Not on file   Family History  Problem Relation Age of Onset   Prostate cancer Son    Kidney disease Neg Hx    Kidney cancer Neg Hx    Allergies  Allergen Reactions   Ace Inhibitors Cough   Codeine Nausea And Vomiting and Nausea Only   Gabapentin Other (See Comments)    unsure   Micardis Hct [Telmisartan-Hctz]     unsure   Prednisone Other (See Comments)    Pt has received cortisone injection for knee with out problems. Pt states she will not take pills for extended time due to feeling lethargic and weight gain. Weight gain   Sulfa Antibiotics Other (See Comments) and Rash    Fever 106  degrees,  sent to ER High fever   Zithromax [Azithromycin] Other (See Comments) and Rash    unsure   Prior to Admission medications   Medication Sig Start Date End Date Taking? Authorizing Provider  acetaminophen (TYLENOL) 325 MG tablet Take 650 mg by mouth 3 (three) times daily as needed.   Yes [provider]  divalproex (DEPAKOTE) 250 MG DR tablet Take 250 mg by mouth 2 (two) times daily.   Yes [provider]  ferrous sulfate 325 (65 FE) MG tablet Take 325 mg by mouth daily at 12 noon.   Yes [provider]  LAMICTAL 25 MG tablet Take 50 mg by mouth 2 (two) times daily. 04/30/22  Yes [provider]  levothyroxine (SYNTHROID) 125 MCG tablet Take 125 mcg by mouth daily. 04/30/22  Yes [provider]  melatonin 3 MG TABS tablet Take 6 mg by mouth at  bedtime.   Yes [provider]  Menthol, Topical Analgesic, (BIOFREEZE) 4 % GEL Apply 1 Application topically at bedtime. Apply to left hip   Yes [provider]  mirtazapine (REMERON) 7.5 MG tablet Take 7.5 mg by mouth at bedtime. 04/30/22  Yes [provider]  Multiple Vitamins-Minerals (MULTIVITAMIN ADULT, MINERALS, PO) Take 1 tablet by mouth daily. 11/16/21  Yes [provider]  omeprazole (PRILOSEC) 40 MG capsule Take 40 mg by mouth 2 (two) times daily. 03/31/19  Yes [provider]  sertraline (ZOLOFT) 50 MG tablet Take 50 mg by mouth daily. 04/03/22  Yes [provider]  traZODone (DESYREL) 100 MG tablet Take 100 mg by mouth at bedtime. 06/08/19  Yes [provider]  acetaminophen (TYLENOL) 500 MG tablet Take 1,000 mg by mouth 2 (two) times daily. Patient not taking: Reported on 05/14/2022    [provider]  amLODipine (NORVASC) 5 MG tablet Take 0.5 tablets (2.5 mg total) by mouth daily. Patient not taking: Reported on 03/24/2021 10/01/20   Eugenie Filler, MD  Cobalamin Combinations (B12 FOLATE) 800-800 MCG CAPS Take 1 capsule by mouth daily at 12 noon. Patient not taking: Reported on 05/14/2022    [provider]  donepezil (ARICEPT) 10 MG tablet Take 10 mg by mouth at bedtime. Patient not taking: Reported on 05/14/2022 03/31/19   [provider]  gabapentin (NEURONTIN) 100 MG capsule Take 100 mg by mouth at bedtime. Patient not taking: Reported on 05/14/2022    [provider]  levothyroxine (SYNTHROID) 137 MCG tablet Take 137 mcg by mouth daily before breakfast. Patient not taking: Reported on 03/24/2021    [provider]  levothyroxine (SYNTHROID) 150 MCG tablet Take 150 mcg by mouth daily. Patient not taking: Reported on 05/14/2022 02/13/21   [provider]  lidocaine (LIDODERM) 5 % Place 1 patch onto the skin every 12 (twelve) hours. Remove & Discard patch within 12 hours or  as directed by MD Patient not taking: Reported on 05/14/2022 11/19/21 11/19/22  Vladimir Crofts, MD  sucralfate (CARAFATE) 1 g tablet Take 1 g by mouth 2 (two) times daily. Patient not taking: Reported on 05/14/2022    [provider]   DG Chest Port 1 View  Result Date: 05/14/2022 CLINICAL DATA:  Dyspnea EXAM: PORTABLE CHEST 1 VIEW COMPARISON:  Chest radiograph dated 03/24/2021 FINDINGS: Asymmetric low right lung volumes with slightly increased interstitial opacities. Left-greater-than-right basilar opacities. Unchanged right lower lung lucency in keeping with known large hiatal hernia. No pleural effusion or pneumothorax. The heart size and mediastinal contours are within normal  limits. Partially imaged left proximal humerus fracture is better evaluated on same day shoulder radiograph. IMPRESSION: 1. Known large hiatal hernia resulting in asymmetric low right lung volumes with slightly increased interstitial opacities, which could be due to pulmonary edema or atypical infection. 2. Left-greater-than-right basilar opacities, likely atelectasis. Electronically Signed   By: Darrin Nipper M.D.   On: 05/14/2022 20:32   CT Cervical Spine Wo Contrast  Result Date: 05/14/2022 CLINICAL DATA:  Neck trauma (Age >= 65y) Dementia patient post unwitnessed fall. EXAM: CT CERVICAL SPINE WITHOUT CONTRAST TECHNIQUE: Multidetector CT imaging of the cervical spine was performed without intravenous contrast. Multiplanar CT image reconstructions were also generated. RADIATION DOSE REDUCTION: This exam was performed according to the departmental dose-optimization program which includes automated exposure control, adjustment of the mA and/or kV according to patient size and/or use of iterative reconstruction technique. COMPARISON:  Cervical and thoracic spine CT 11/19/2021 FINDINGS: Alignment: No traumatic subluxation. Skull base and vertebrae: No acute fracture. Vertebral body heights are maintained. The dens and skull base are  intact. Stable bone island within anterior right aspect of C1 arch. Soft tissues and spinal canal: No prevertebral fluid or swelling. No visible canal hematoma. Chronic C1-C2 pannus. Disc levels: Stable degenerative disc disease with mild facet hypertrophy. Upper chest: Chronic T4 compression deformity is stable from prior exam. Small right pleural effusion is partially visualized. Other: Carotid calcifications. IMPRESSION: 1. No acute fracture or traumatic subluxation of the cervical spine. 2. Chronic T4 compression deformity. 3. Small right pleural effusion is partially visualized. Electronically Signed   By: Keith Rake M.D.   On: 05/14/2022 15:52   CT Head Wo Contrast  Result Date: 05/14/2022 CLINICAL DATA:  Head trauma, minor (Age >= 65y) Unwitnessed fall. EXAM: CT HEAD WITHOUT CONTRAST TECHNIQUE: Contiguous axial images were obtained from the base of the skull through the vertex without intravenous contrast. RADIATION DOSE REDUCTION: This exam was performed according to the departmental dose-optimization program which includes automated exposure control, adjustment of the mA and/or kV according to patient size and/or use of iterative reconstruction technique. COMPARISON:  Head CT 11/19/2021 FINDINGS: Brain: No acute intracranial hemorrhage. No subdural or extra-axial collection. Stable degree of atrophy and chronic small vessel ischemic change. Stable ventricular size without hydrocephalus. No evidence of acute ischemia, no midline shift or mass effect. Vascular: Atherosclerosis of skullbase vasculature without hyperdense vessel or abnormal calcification. Skull: No fracture or focal lesion. Sinuses/Orbits: Paranasal sinuses and mastoid air cells are clear. The visualized orbits are unremarkable. Bilateral lens resection. Other: No large scalp hematoma. IMPRESSION: 1. No acute intracranial abnormality. No skull fracture. 2. Stable atrophy and chronic small vessel ischemic change. Electronically Signed    By: Keith Rake M.D.   On: 05/14/2022 15:46   DG Femur Min 2 Views Left  Result Date: 05/14/2022 CLINICAL DATA:  Fall, left hip pain EXAM: LEFT FEMUR 2 VIEWS; PELVIS - 1-2 VIEW COMPARISON:  06/21/2019 FINDINGS: Acute fracture involving the intertrochanteric and subtrochanteric aspect of the left femur with superolateral displacement of the femoral shaft component. Prior proximal left femoral ORIF with 3 partially threaded screws. Fracture involves the hardware in location. Hip joint alignment is maintained without dislocation. Prior left total knee arthroplasty without apparent complication. The imaged right total hip arthroplasty hardware appears intact. Bones are diffusely demineralized. No pelvic diastasis. IMPRESSION: Acute fracture involving the intertrochanteric and subtrochanteric aspect of the left femur with superolateral displacement of the femoral shaft component. Electronically Signed   By: Davina Poke D.O.  On: 05/14/2022 14:27   DG Pelvis 1-2 Views  Result Date: 05/14/2022 CLINICAL DATA:  Fall, left hip pain EXAM: LEFT FEMUR 2 VIEWS; PELVIS - 1-2 VIEW COMPARISON:  06/21/2019 FINDINGS: Acute fracture involving the intertrochanteric and subtrochanteric aspect of the left femur with superolateral displacement of the femoral shaft component. Prior proximal left femoral ORIF with 3 partially threaded screws. Fracture involves the hardware in location. Hip joint alignment is maintained without dislocation. Prior left total knee arthroplasty without apparent complication. The imaged right total hip arthroplasty hardware appears intact. Bones are diffusely demineralized. No pelvic diastasis. IMPRESSION: Acute fracture involving the intertrochanteric and subtrochanteric aspect of the left femur with superolateral displacement of the femoral shaft component. Electronically Signed   By: Duanne Guess D.O.   On: 05/14/2022 14:27   DG Shoulder Left  Result Date: 05/14/2022 CLINICAL DATA:   Fall, left shoulder pain EXAM: LEFT SHOULDER - 2+ VIEW COMPARISON:  03/24/2021 FINDINGS: Acute fracture of the left humeral head and neck. Oblique fracture of the humeral neck and proximal metaphysis with mild lateral displacement. Comminuted minimally displaced fracture involving the greater tuberosity. Advanced glenohumeral osteoarthritis. Intra-articular loose body or less likely fracture fragment at the superior joint line. Intact AC joint. Bones are demineralized. Soft tissue swelling of the upper arm. IMPRESSION: 1. Acute fracture of the left humeral head and neck. 2. Advanced glenohumeral osteoarthritis. Electronically Signed   By: Duanne Guess D.O.   On: 05/14/2022 14:24    Positive ROS: All other systems have been reviewed and were otherwise negative with the exception of those mentioned in the HPI and as above.  Physical Exam: General: Alert, no acute distress Cardiovascular: No pedal edema Respiratory: No cyanosis, no use of accessory musculature GI: No organomegaly, abdomen is soft and non-tender Skin: No lesions in the area of chief complaint Neurologic: Sensation intact distally Psychiatric: Patient is competent for consent with normal mood and affect Lymphatic: No axillary or cervical lymphadenopathy  MUSCULOSKELETAL: left leg short, externally rotated, pain with IR/ER.  Shoulder with ecchymosis and swelling Compartments soft. Good cap refill. Motor and sensory intact distally.  Assessment: Left subtrochanteric hip fracture after percutaneous pinning in 2021 Left proximal humerus fracture  Plan: Patient is a poor surgical candidate. Family has decided to proceed with non-operative management. Recommend comfort care. Patient is to be non-weight bearing on the left upper extremity and left lower extremity. Please call with questions.    Lyndle Herrlich, MD    05/15/2022 3:10 PM

## 2022-05-15 NOTE — TOC Progression Note (Signed)
Transition of Care [facility]) - Progression Note    Patient Details  Name: [name] MRN: [id number] Date of Birth: 01-12-1929  Transition of [facility]) CM/SW [place], RN Phone Number: 05/15/2022, 9:05 AM  Clinical Narrative:     TOC following the patient to assist with DC planning, Ortho Consult pending, [facility], Alert to self and place TOC to continue to follow and asssit  Expected Discharge Plan: Skilled Nursing Facility Barriers to Discharge: Continued Medical Work up, SNF Pending bed offer  Expected Discharge Plan and Services                                               Social Determinants of Health (SDOH) Interventions SDOH Screenings   Food Insecurity: No Food Insecurity (05/14/2022)  Housing: Low Risk  (05/14/2022)  Transportation Needs: No Transportation Needs (05/14/2022)  Utilities: Not At Risk (05/14/2022)  Tobacco Use: Medium Risk (05/15/2022)    Readmission Risk Interventions    10/08/2019   10:58 AM  Readmission Risk Prevention Plan  Transportation Screening Complete  PCP or Specialist Appt within 3-5 Days Complete  HRI or [place] Complete  Social Work Consult for [place] Planning/Counseling Complete  Palliative Care Screening Not Applicable  Medication Review [employer]) Referral to Pharmacy

## 2022-05-15 NOTE — Progress Notes (Signed)
Called report to facility, pt is being transported to the [facility].  PIV was removed due to occulusion, two nurse attempt veins blew placed order for stat IV team placement, Per RN from [facility] have working PIV in place.

## 2022-05-15 NOTE — Progress Notes (Signed)
[place] Patient  Current hospice patient followed for abnormal weight loss who fell at [facility], patient's ALF. She was admitted f2.6.24 for left hip fracture and left humerus fracture. Per. Dr. Jewel Cabrera, this is a related hospital admission.   Patient found lying in her hospital bed, awake, alert and oriented to self.  She states severe pain and asked for something for pain.  Kerry notified.  Patient's son, Kerry Cabrera at the bedside.  States he would not want his mother to undergo surgery.  After further discussion, he was not aware his mother was  under hospice care.  Dr. Harlow Cabrera, Orthopedic surgeon came to speak with [facility].  Patient is  not a candidate for surgery.  I called and spoke with patient's daughter, Kerry Cabrera who wanted to speak with Dr. Harlow Cabrera to discuss options.   She is in agreement with and doesn't want to pursue any aggressive measures and wants to ensure her mom is comfortable.  She request transfer to the hospice home for symptom management of severe pain in the setting of multiple displaced fractures.  Patient is receiving IV morphine 2mg  every 4 hours as needed for pain with doses today at 0418 and 1006. Patient had a rapid response called on her last night with hypotension 58/38.  Blood pressure came back up after NS bolus and further treatment.    Vital Signs:  T-98.5  BP 115/52, P-66, R-18  Oxi 90% on room air.  Hospital Problems:    Femur fracture, left [facility]) - Fall precautions - Orthopedic service has been consulted to Dr. Harlow Cabrera via EDP   2.  Hypokalemia - Check magnesium level - Polycitra solution 40 mEq p.o. one-time dose ordered - EKG ordered, discussed with nursing via secure chat to complete and upload to epic   3.  Closed left humeral fracture Acute fracture of left humeral head and neck - Orthopedic service has been consulted to Dr. Harlow Cabrera via [place] and via epic order by myself - Symptomatic support: morphine 1 mg  IV every 4 hours as needed for severe pain, 4 doses ordered; fentanyl 12.5 mg IV every 2 hours as needed for severe pain, 4 doses ordered - AM team to evaluate patient at bedside to determine continued opioid pain medication requirements   4.  Dementia without behavioral disturbance (HCC) - Depakote 250 mg p.o. twice daily, lamotrigine 50 mg p.o. twice daily resumed on admission   5.  Depression with anxiety - Sertraline 50 mg daily, mirtazapine 7.5 mg nightly, trazodone 100 mg nightly were resumed on admission   6.  Acquired hypothyroidism - Levothyroxine 125 mcg daily resumed   Discharge Planning:  Discharge to [place] this evening.   Family contact:  Spoke with Kerry Cabrera, patient's daughter and HCPOA via phone to update on current status and to discuss plan of care.  She request transfer to [facility] for symptom management.    IDT:  Updated     Goals of Care-   Comfort care.  Patient with inoperable fractures and will need symptom management in the setting of severe pain.  Remains DNR   Will continue to follow through final disposition.   Kerry Cabrera, Kerry Cabrera [phone]

## 2022-05-16 NOTE — Plan of Care (Signed)

## 2022-06-08 DEATH — deceased
